# Patient Record
Sex: Female | Born: 1960 | Race: White | Hispanic: No | Marital: Married | Smoking: Never smoker
Health system: Southern US, Community
[De-identification: ages and names within clinical notes are randomized; demographics above are authoritative.]

## PROBLEM LIST (undated history)

## (undated) DIAGNOSIS — N840 Polyp of corpus uteri: Secondary | ICD-10-CM

## (undated) DIAGNOSIS — N83292 Other ovarian cyst, left side: Secondary | ICD-10-CM

## (undated) DIAGNOSIS — R011 Cardiac murmur, unspecified: Secondary | ICD-10-CM

## (undated) DIAGNOSIS — J302 Other seasonal allergic rhinitis: Secondary | ICD-10-CM

## (undated) DIAGNOSIS — Z973 Presence of spectacles and contact lenses: Secondary | ICD-10-CM

## (undated) DIAGNOSIS — K589 Irritable bowel syndrome without diarrhea: Secondary | ICD-10-CM

## (undated) DIAGNOSIS — Z8719 Personal history of other diseases of the digestive system: Secondary | ICD-10-CM

## (undated) HISTORY — DX: Irritable bowel syndrome, unspecified: K58.9

## (undated) HISTORY — DX: Other seasonal allergic rhinitis: J30.2

## (undated) HISTORY — PX: COLONOSCOPY: SHX174

## (undated) HISTORY — PX: COLONOSCOPY WITH PROPOFOL: SHX5780

## (undated) SURGERY — Surgical Case
Anesthesia: *Unknown

---

## 1998-02-02 ENCOUNTER — Inpatient Hospital Stay (HOSPITAL_COMMUNITY): Admission: AD | Admit: 1998-02-02 | Discharge: 1998-02-04 | Payer: Self-pay | Admitting: Obstetrics and Gynecology

## 1998-02-07 ENCOUNTER — Inpatient Hospital Stay (HOSPITAL_COMMUNITY): Admission: AD | Admit: 1998-02-07 | Discharge: 1998-02-07 | Payer: Self-pay | Admitting: Obstetrics and Gynecology

## 2004-08-14 ENCOUNTER — Ambulatory Visit: Payer: Self-pay | Admitting: Family Medicine

## 2004-08-28 ENCOUNTER — Ambulatory Visit: Payer: Self-pay | Admitting: Internal Medicine

## 2004-10-03 ENCOUNTER — Ambulatory Visit: Payer: Self-pay | Admitting: Internal Medicine

## 2004-10-03 ENCOUNTER — Encounter (INDEPENDENT_AMBULATORY_CARE_PROVIDER_SITE_OTHER): Payer: Self-pay | Admitting: *Deleted

## 2004-10-17 ENCOUNTER — Ambulatory Visit (HOSPITAL_COMMUNITY): Admission: RE | Admit: 2004-10-17 | Discharge: 2004-10-17 | Payer: Self-pay | Admitting: Internal Medicine

## 2004-10-19 ENCOUNTER — Ambulatory Visit: Payer: Self-pay | Admitting: Internal Medicine

## 2005-03-04 ENCOUNTER — Ambulatory Visit: Payer: Self-pay | Admitting: Family Medicine

## 2007-02-27 ENCOUNTER — Ambulatory Visit: Payer: Self-pay | Admitting: Family Medicine

## 2007-02-27 DIAGNOSIS — J069 Acute upper respiratory infection, unspecified: Secondary | ICD-10-CM | POA: Insufficient documentation

## 2007-04-09 HISTORY — PX: PLANTAR FASCIA SURGERY: SHX746

## 2007-07-14 ENCOUNTER — Encounter: Payer: Self-pay | Admitting: Family Medicine

## 2007-08-12 ENCOUNTER — Ambulatory Visit (HOSPITAL_COMMUNITY): Admission: RE | Admit: 2007-08-12 | Discharge: 2007-08-12 | Payer: Self-pay | Admitting: Obstetrics and Gynecology

## 2007-08-12 ENCOUNTER — Encounter (INDEPENDENT_AMBULATORY_CARE_PROVIDER_SITE_OTHER): Payer: Self-pay | Admitting: Obstetrics and Gynecology

## 2007-08-12 ENCOUNTER — Ambulatory Visit: Payer: Self-pay | Admitting: Cardiology

## 2007-08-12 HISTORY — PX: TRANSTHORACIC ECHOCARDIOGRAM: SHX275

## 2007-09-28 ENCOUNTER — Ambulatory Visit: Payer: Self-pay | Admitting: Cardiovascular Disease

## 2008-08-29 ENCOUNTER — Encounter: Payer: Self-pay | Admitting: Family Medicine

## 2010-05-02 ENCOUNTER — Ambulatory Visit
Admission: RE | Admit: 2010-05-02 | Discharge: 2010-05-02 | Payer: Self-pay | Source: Home / Self Care | Attending: Family Medicine | Admitting: Family Medicine

## 2010-05-02 DIAGNOSIS — J019 Acute sinusitis, unspecified: Secondary | ICD-10-CM | POA: Insufficient documentation

## 2010-05-10 NOTE — Assessment & Plan Note (Signed)
Summary: congested/cb   Vital Signs:  Patient profile:   50 year old female Height:      53 inches Weight:      140 pounds BMI:     35.17 Temp:     98.7 degrees F oral BP sitting:   118 / 70  (left arm)  Vitals Entered By: Doristine Devoid CMA (May 02, 2010 2:43 PM) CC: ears full and painful along w/ sinus pain and pressure having HA and cough   History of Present Illness: 50 yo woman here today for ? sinus infxn.  sxs started Friday w/ sore throat.  progressed to nasal and head congestion.  now having facial pain, ear fullness and pressure.  no tooth pain.  + nasal drainage.  + cough.  no fevers.  + sick contacts.  Preventive Screening-Counseling & Management  Alcohol-Tobacco     Smoking Status: never  Current Medications (verified): 1)  None  Allergies (verified): No Known Drug Allergies  Past History:  Past Medical History: none  Past Surgical History: none  Social History: married 2 daughters Smoking Status:  never  Review of Systems      See HPI  Physical Exam  General:  Well-developed,well-nourished,in no acute distress; alert,appropriate and cooperative throughout examination Head:  NCAT, + TTP over frontal and maxillary sinuses Eyes:  no injxn or inflammation Ears:  External ear exam shows no significant lesions or deformities.  Otoscopic examination reveals clear canals, tympanic membranes are intact bilaterally without bulging, retraction, inflammation or discharge. Hearing is grossly normal bilaterally. Nose:  + congestion Mouth:  postnasal drip.   Neck:  No deformities, masses, or tenderness noted. Lungs:  Normal respiratory effort, chest expands symmetrically. Lungs are clear to auscultation, no crackles or wheezes. Heart:  Normal rate and regular rhythm. S1 and S2 normal without gallop, murmur, click, rub or other extra sounds.   Impression & Recommendations:  Problem # 1:  SINUSITIS - ACUTE-NOS (ICD-461.9) Assessment New sxs and PE  consistent w/ bacterial infxn.  start abx.  reviewed supportive care and red flags that should prompt return.  Pt expresses understanding and is in agreement w/ this plan. The following medications were removed from the medication list:    Augmentin 875-125 Mg Tabs (Amoxicillin-pot clavulanate) .Marland Kitchen... 1 by mouth two times a day Her updated medication list for this problem includes:    Augmentin 875-125 Mg Tabs (Amoxicillin-pot clavulanate) .Marland Kitchen... 1 by mouth 2 times daily.  take w/ food.  Complete Medication List: 1)  Augmentin 875-125 Mg Tabs (Amoxicillin-pot clavulanate) .Marland Kitchen.. 1 by mouth 2 times daily.  take w/ food.  Patient Instructions: 1)  You have a sinus infection 2)  Take the Augmentin as directed- take w/ food to avoid upset stomach 3)  Take the Diflucan if you develop yeast 4)  Drink plenty of fluids 5)  Mucinex as needed to thin your congestion 6)  Call with any questions or concerns 7)  Hang in there!!! Prescriptions: AUGMENTIN 875-125 MG TABS (AMOXICILLIN-POT CLAVULANATE) 1 by mouth 2 times daily.  take w/ food.  #20 x 0   Entered and Authorized by:   Neena Rhymes MD   Signed by:   Neena Rhymes MD on 05/02/2010   Method used:   Electronically to        [facility]  [address] [id number] ([facility]       [address]       [place], [place]  [address]  Ph: [phone] or [phone]       Fax: [phone]   RxID:   [id number]    Orders Added: 1)  New Patient Level II [44010]

## 2010-08-21 NOTE — Assessment & Plan Note (Signed)
[facility]                            CARDIOLOGY OFFICE NOTE   Elizabeth Meyer                     MRN:          [id number]  DATE:09/28/2007                            DOB:          [birth year]    This is a 50 year old patient referred for an abnormal echo and murmur.   The patient is asymptomatic.  She is 50 years old.  She saw Dr. Arelia Sneddon  for her routine OB/GYN exam, and he thought he heard a murmur.  She  subsequently was referred for 2D echocardiogram, which I reviewed.  Her  septal thickness was upper limits of normal at 11 mm.  She had trivial  MR.  There was a pseudonormal left ventricular filling pattern by mitral  inflows.  Dr. Diona Browner read it as concomitant with abnormal relaxation  and increased filling pressure.   However, the patient is asymptomatic.  She is not having significant  chest pain, PND, or orthopnea.  There is no fatigue.  There is no  previous history of hypertension or nonsignificant hypertrophic  cardiomyopathy.   The patient's activities lately have been somewhat limited by plantar  fasciitis.  She had a Topaz  procedure by a foot doctor recently with  improvement.  She is now wearing tennis sneakers, almost exclusively.   I had a long discussion with the patient regarding the nonspecific  findings regarding mitral inflow patterns, their age dependence, and the  fact that her septal thickness was normal.  I do not think she has  significant problems with her heart.   In fact, I am not quite sure how pseudonormal LV pattern could be  established without pulmonary vein Doppler.   In any event, the patient's apparent murmur would be benign.  She had no  significant valvular heart disease by echo and no evidence of  hypertrophic cardiomyopathy.   Her review of systems is otherwise negative.  In particular, she has not  had palpitations, syncope, chest pain, PND, or orthopnea.   Past medical history is  relatively benign.  She has had 2 uncomplicated  pregnancies and plantar fasciitis with the recent Topaz procedure.   No known allergies.   The patient's review of systems is otherwise remarkable for some  seasonal allergies and some irritable bowel syndrome.  Family history is  negative for premature coronary artery disease.  She is on Lo/Ovral  birth control and over-the-counter allergy medicines.   The patient is happily married.  She has 2 children.  She is a  Futures trader.  She is back to walking some since her foot is improving.  She is otherwise fairly sedentary.   She has 1-2 caffeinated beverages a day and does not drink or smoke.   Past medical history is otherwise benign.   PHYSICAL EXAMINATION:  GENERAL:  Remarkable for healthy-appearing 44-  year-old female in no distress.  Affect is appropriate.  VITAL SIGNS:  Her weight is 148, blood pressure is 127/78, pulse 80 and  regular, respiratory rate 14.  Afebrile.  HEENT:  Unremarkable.  Carotids normal without bruit.  No  lymphadenopathy, no thyromegaly, no JVP  elevation.  LUNGS:  Clear, good diaphragmatic motion.  No wheezing.  CHEST:  S1 and S2, a very faint short systolic ejection murmur which is  benign.  PMI normal .  ABDOMEN:  Benign.  Bowel sounds positive.  No AAA.  No bruit.  No  tenderness.  No hepatosplenomegaly.  No hepatojugular reflux.  EXTREMITIES:  Distal pulses intact.  No edema.  NEURO:  Nonfocal.  SKIN:  Warm and dry.  MUSCULOSKELETAL:  No muscular weakness.  EKG is normal with no evidence of LVH.   IMPRESSION:  1. Murmur benign.  No need for subacute bacterial endocarditis      prophylaxis.  No significant valvular heart disease by echo.  2. Abnormal mitral inflow Doppler signal, nonspecific.  No evidence of      hypertension.  Stiff heart or left ventricular hypertrophy.      Clinically not significant.  3. Plantar fasciitis.  Follow up with the foot doctor, p.r.n.      nonsteroidals, orthotic  shoes, and tennis sneakers.  4. Birth control per Dr. Arelia Sneddon, continue Lo/Ovral.  5. Seasonal allergies, p.r.n. Claritin.   The patient will be seen on an as-needed basis and would not appear to  have any significant cardiac problems.     Elizabeth Pick. Eden Emms, MD, [facility]  Electronically Signed    PCN/MedQ  DD: 09/28/2007  DT: 09/29/2007  Job #: [id number]   cc:   Juluis Meyer, M.D.  Elizabeth Perla, DO

## 2011-03-27 ENCOUNTER — Ambulatory Visit (INDEPENDENT_AMBULATORY_CARE_PROVIDER_SITE_OTHER): Payer: Managed Care, Other (non HMO) | Admitting: Internal Medicine

## 2011-03-27 ENCOUNTER — Encounter: Payer: Self-pay | Admitting: Internal Medicine

## 2011-03-27 DIAGNOSIS — J069 Acute upper respiratory infection, unspecified: Secondary | ICD-10-CM

## 2011-03-27 DIAGNOSIS — J209 Acute bronchitis, unspecified: Secondary | ICD-10-CM

## 2011-03-27 MED ORDER — AMOXICILLIN 500 MG PO CAPS: 500.0000 mg | ORAL_CAPSULE | Freq: Three times a day (TID) | ORAL | Status: AC

## 2011-03-27 MED ORDER — FLUTICASONE PROPIONATE 50 MCG/ACT NA SUSP: 2.0000 | Freq: Every day | NASAL | Status: DC

## 2011-03-27 NOTE — Patient Instructions (Signed)
Plain Mucinex for thick secretions ;force NON dairy fluids . Use a Neti pot daily as needed for sinus congestion . Fluticasone 1 spray in each nostril twice a day as needed. Use the "crossover" technique as discussed   

## 2011-03-27 NOTE — Progress Notes (Signed)
  Subjective:    Patient ID: Elizabeth Meyer, female    DOB: April 26, 1960, 50 y.o.   MRN: [id number]  HPI Respiratory tract infection Onset/symptoms:12/13 as ST Exposures (illness/environmental/extrinsic):no Progression of symptoms:to chest & head congestion as of 12/16 with laryngitis Treatments/response:Mucinex w/o benefit; Sudafed helps Present symptoms: Fever/chills/sweats:no Frontal headache:no Facial pain:some Nasal purulence:yellow green as of today  Dental pain:no Lymphadenopathy:no Wheezing/shortness of breath:no Cough/sputum/hemoptysis:as of12/17 yellow sputum Pleuritic pain:no Associated extrinsic/allergic symptoms:itchy eyes/ sneezing:no Past medical history: Seasonal allergies: yes/asthma:no Smoking history:neverww               Review of Systems     Objective:   Physical Exam General appearance is of good health and nourishment; no acute distress or increased work of breathing is present.  No  lymphadenopathy about the head, neck, or axilla noted.   Eyes: No conjunctival inflammation or lid edema is present.  Ears:  External ear exam shows no significant lesions or deformities.  Otoscopic examination reveals clear canals, tympanic membranes are intact bilaterally without bulging, retraction, inflammation or discharge.  Nose:  External nasal examination shows no deformity or inflammation. Nasal mucosa are pink and moist without lesions or exudates. No septal dislocation .No obstruction to airflow.   Oral exam: Dental hygiene is good; lips and gums are healthy appearing.There is no oropharyngeal erythema or exudate noted.      Heart:  Normal rate and regular rhythm. S1 and S2 normal without gallop, murmur, click, rub or other extra sounds.   Lungs:Chest clear to auscultation; no wheezes, rhonchi,rales ,or rubs present.No increased work of breathing.    Extremities:  No cyanosis, edema, or clubbing  noted    Skin: Warm & dry           Assessment &  Plan:   #1 upper structure infection with period of secretions and laryngitis  #2 purulent sputum which historically does not appear to be related to postnasal drainage  Plan: See orders and recommendations. Neti pot recommended  & stopping the Sudafed.

## 2011-08-28 ENCOUNTER — Other Ambulatory Visit: Payer: Self-pay | Admitting: Dermatology

## 2012-11-25 ENCOUNTER — Encounter: Payer: Self-pay | Admitting: Internal Medicine

## 2012-12-28 ENCOUNTER — Encounter: Payer: Self-pay | Admitting: Internal Medicine

## 2012-12-28 ENCOUNTER — Other Ambulatory Visit (INDEPENDENT_AMBULATORY_CARE_PROVIDER_SITE_OTHER): Payer: Managed Care, Other (non HMO)

## 2012-12-28 ENCOUNTER — Ambulatory Visit (INDEPENDENT_AMBULATORY_CARE_PROVIDER_SITE_OTHER): Payer: Managed Care, Other (non HMO) | Admitting: Internal Medicine

## 2012-12-28 VITALS — BP 110/72 | HR 84 | Ht 61.75 in | Wt 151.0 lb

## 2012-12-28 DIAGNOSIS — K589 Irritable bowel syndrome without diarrhea: Secondary | ICD-10-CM

## 2012-12-28 DIAGNOSIS — R109 Unspecified abdominal pain: Secondary | ICD-10-CM

## 2012-12-28 DIAGNOSIS — R197 Diarrhea, unspecified: Secondary | ICD-10-CM

## 2012-12-28 MED ORDER — HYOSCYAMINE SULFATE 0.125 MG SL SUBL: 0.1250 mg | SUBLINGUAL_TABLET | SUBLINGUAL | Status: DC | PRN

## 2012-12-28 NOTE — Patient Instructions (Addendum)
Your physician has requested that you go to the basement for lab work before leaving today.  We have sent the following medications to your pharmacy for you to pick up at your convenience: Generic Levsin   Today you have been given handouts to read and follow on IBS and also low fiber and Low residue diet.  Follow up with [place] in 6 weeks.   I appreciate the opportunity to care for you.

## 2012-12-28 NOTE — Progress Notes (Signed)
  Subjective:    Patient ID: Elizabeth Meyer, female    DOB: September 21, 1960, 52 y.o.   MRN: [id number]  HPI The patient is a very nice woman seen in the past, 2006, for similar c/o of abdominal pain and diarrhea. A colonoscopy revealed some terminal ileal ulcers. Apparently she improved some but then has had years of crampy abdominal pain and post-prandial urgent loose defecation. She also has bulging in the anorectum without bleeding - says there are protrusions that spontaneously reduce. No weight loss. No fevere. Sxs are stable over the years. Denies lactose problems or excessive sugar-free foods. No Known Allergies Outpatient Prescriptions Prior to Visit  Medication Sig Dispense Refill  . fluticasone (FLONASE) 50 MCG/ACT nasal spray Place 2 sprays into the nose daily.  16 g  6   No facility-administered medications prior to visit.   Past Medical History  Diagnosis Date  . Plantar fasciitis   . Seasonal allergies    Past Surgical History  Procedure Laterality Date  . Topaz procedure      for plantar fasciitis  . Colonoscopy w/ biopsies  10/03/2004   History   Social History  . Marital Status: Married    Spouse Name: N/A    Number of Children: 2  . Years of Education: N/A   Occupational History  . preschool teacher    Social History Main Topics  . Smoking status: Never Smoker   . Smokeless tobacco: Never Used  . Alcohol Use: Yes     Comment: oaccional-social  . Drug Use: No          Social History Narrative   Married, Administrator 2 daughters   1-2 caffeinated beverages/day   Family History  Problem Relation Age of Onset  . Glaucoma Mother   . Hypertension Mother   . Hyperlipidemia Mother   . Heart disease Paternal Grandfather   . Tuberculosis Maternal Grandfather   . Dementia Paternal Grandmother   . Diabetes Maternal Grandfather    Review of Systems + allergies - all other ROS negative or as above    Objective:   Physical Exam General:   Well-developed, well-nourished and in no acute distress Eyes:  anicteric. Neck:   supple w/o thyromegaly or mass.  Lungs: Clear to auscultation bilaterally. Heart:  S1S2, no rubs, murmurs, gallops. Abdomen:  soft, non-tender, no hepatosplenomegaly, hernia, or mass and BS+.  Lymph:  no inguinal adenopathy. Extremities:   no edema Skin   no rash. Neuro:  A&O x 3.  Psych:  appropriate mood and  Affect.   Data Reviewed: 2006 records colonoscopy, pathology, negative SBFT       Assessment & Plan:  IBS (irritable bowel syndrome) - most likely - did have non-specific erosins of terminal ileum 2006, negative SBFT   Diarrhea  Abdominal pain, lower, unspecified laterality   1. Hyoscyamine 0.125 mg prn 2. Low fiber diet 3. TTG Ab and IgA level, IBD screen 4. ? Hemorrhoid ligation    5. Hold on colonoscopy, due for screening later this year though could wait 6. See me 6 weeks 7. IBS education 8. Advised to get influenza vaccination this season

## 2012-12-29 ENCOUNTER — Encounter: Payer: Self-pay | Admitting: Internal Medicine

## 2012-12-29 LAB — IBD EXPANDED PANEL: Atypical pANCA: NEGATIVE

## 2012-12-29 LAB — TISSUE TRANSGLUTAMINASE, IGA: Tissue Transglutaminase Ab, IgA: 2.4 U/mL (ref <20)

## 2012-12-31 NOTE — Progress Notes (Signed)
Quick Note:  Please nT IBD panel was negative, which goes against her having Crohn's disease. We'll discuss more at followup ______

## 2013-03-30 ENCOUNTER — Encounter: Payer: Self-pay | Admitting: Internal Medicine

## 2013-07-02 ENCOUNTER — Encounter: Payer: Self-pay | Admitting: Internal Medicine

## 2014-01-04 ENCOUNTER — Other Ambulatory Visit: Payer: Self-pay | Admitting: Obstetrics and Gynecology

## 2014-01-05 LAB — CYTOLOGY - PAP

## 2014-03-02 ENCOUNTER — Encounter: Payer: Self-pay | Admitting: Gastroenterology

## 2014-03-08 ENCOUNTER — Ambulatory Visit (INDEPENDENT_AMBULATORY_CARE_PROVIDER_SITE_OTHER): Payer: 59 | Admitting: Internal Medicine

## 2014-03-08 ENCOUNTER — Encounter: Payer: Self-pay | Admitting: Internal Medicine

## 2014-03-08 VITALS — BP 110/70 | HR 90 | Ht 62.0 in | Wt 146.8 lb

## 2014-03-08 DIAGNOSIS — Z1211 Encounter for screening for malignant neoplasm of colon: Secondary | ICD-10-CM

## 2014-03-08 DIAGNOSIS — K589 Irritable bowel syndrome without diarrhea: Secondary | ICD-10-CM

## 2014-03-08 NOTE — Patient Instructions (Addendum)
You have been scheduled for a colonoscopy. Please follow written instructions given to you at your visit today.  Please pick up your prep supplies at the pharmacy. If you use inhalers (even only as needed), please bring them with you on the day of your procedure. Your physician has requested that you go to www.startemmi.com and enter the access code given to you at your visit today. This web site gives a general overview about your procedure. However, you should still follow specific instructions given to you by our office regarding your preparation for the procedure.    You may use Crystal Lite in place of Gatorade for your prep.   I appreciate the opportunity to care for you.

## 2014-03-08 NOTE — Progress Notes (Signed)
   Subjective:    Patient ID: Elizabeth Meyer, female    DOB: 1961/02/08, 53 y.o.   MRN: [id number]  HPI Is a very nice lady with a history of IBS which is controlled by intermittent hyoscyamine use. She had a colonoscopy in 2006 notes and terminal ileal erosions at that time but subsequent serologic workup or IBD and celiac disease has been negative. Most days she is well and she only uses the hyoscyamine when necessary. She is now 53 and it is appropriate to consider a screening colonoscopy as we discussed last year. Medications, allergies, past medical history, past surgical history, family history and social history are reviewed and updated in the EMR.   Review of Systems As above    Objective:   Physical Exam Well-developed middle-aged white woman in no acute distress    Assessment & Plan:   1. IBS (irritable bowel syndrome)   2. Colon cancer screening    1. screening colonoscopy will be scheduled The risks and benefits as well as alternatives of endoscopic procedure(s) have been discussed and reviewed. All questions answered. The patient agrees to proceed. Continue intermittent hyoscyamine She will use Crystal light in place of Gatorade for her colonoscopy prep per her request Will copy PCP and Dr. Radene Knee with colonoscopy report

## 2014-03-25 ENCOUNTER — Encounter: Payer: Self-pay | Admitting: Internal Medicine

## 2014-04-28 ENCOUNTER — Encounter: Payer: Self-pay | Admitting: Internal Medicine

## 2014-04-28 ENCOUNTER — Ambulatory Visit (AMBULATORY_SURGERY_CENTER): Payer: 59 | Admitting: Internal Medicine

## 2014-04-28 VITALS — BP 118/70 | HR 62 | Temp 97.9°F | Resp 14 | Ht 62.0 in | Wt 146.0 lb

## 2014-04-28 DIAGNOSIS — K589 Irritable bowel syndrome without diarrhea: Secondary | ICD-10-CM

## 2014-04-28 DIAGNOSIS — Z1211 Encounter for screening for malignant neoplasm of colon: Secondary | ICD-10-CM

## 2014-04-28 MED ORDER — SODIUM CHLORIDE 0.9 % IV SOLN: 500.0000 mL | INTRAVENOUS | Status: DC

## 2014-04-28 NOTE — Progress Notes (Signed)
Stable to RR 

## 2014-04-28 NOTE — Patient Instructions (Addendum)
I am pleased to tell you the colonoscopy was normal. So was the terminal ileum - no ulcers.  I hope you stay well. Some people with IBS find that following a special diet called a FODMAPS diet helps them - you may want to investigate this depending upon how things go.  Next routine colonoscopy in 10 years - 2026  I appreciate the opportunity to care for you. Elizabeth Mayer, MD, FACG  YOU HAD AN ENDOSCOPIC PROCEDURE TODAY AT [facility]: Refer to the procedure report that was given to you for any specific questions about what was found during the examination.  If the procedure report does not answer your questions, please call your gastroenterologist to clarify.  If you requested that your care partner not be given the details of your procedure findings, then the procedure report has been included in a sealed envelope for you to review at your convenience later.  YOU SHOULD EXPECT: Some feelings of bloating in the abdomen. Passage of more gas than usual.  Walking can help get rid of the air that was put into your GI tract during the procedure and reduce the bloating. If you had a lower endoscopy (such as a colonoscopy or flexible sigmoidoscopy) you may notice spotting of blood in your stool or on the toilet paper. If you underwent a bowel prep for your procedure, then you may not have a normal bowel movement for a few days.  DIET: Your first meal following the procedure should be a light meal and then it is ok to progress to your normal diet.  A half-sandwich or bowl of soup is an example of a good first meal.  Heavy or fried foods are harder to digest and may make you feel nauseous or bloated.  Likewise meals heavy in dairy and vegetables can cause extra gas to form and this can also increase the bloating.  Drink plenty of fluids but you should avoid alcoholic beverages for 24 hours.  ACTIVITY: Your care partner should take you home directly after the procedure.  You should  plan to take it easy, moving slowly for the rest of the day.  You can resume normal activity the day after the procedure however you should NOT DRIVE or use heavy machinery for 24 hours (because of the sedation medicines used during the test).    SYMPTOMS TO REPORT IMMEDIATELY: A gastroenterologist can be reached at any hour.  During normal business hours, 8:30 AM to 5:00 PM Monday through Friday, call [phone].  After hours and on weekends, please call the GI answering service at [phone] who will take a message and have the physician on call contact you.   Following lower endoscopy (colonoscopy or flexible sigmoidoscopy):  Excessive amounts of blood in the stool  Significant tenderness or worsening of abdominal pains  Swelling of the abdomen that is new, acute  Fever of 100F or higher  Following upper endoscopy (EGD)  Vomiting of blood or coffee ground material  New chest pain or pain under the shoulder blades  Painful or persistently difficult swallowing  New shortness of breath  Fever of 100F or higher  Black, tarry-looking stools  FOLLOW UP: If any biopsies were taken you will be contacted by phone or by letter within the next 1-3 weeks.  Call your gastroenterologist if you have not heard about the biopsies in 3 weeks.  Our staff will call the home number listed on your records the next business day following  your procedure to check on you and address any questions or concerns that you may have at that time regarding the information given to you following your procedure. This is a courtesy call and so if there is no answer at the home number and we have not heard from you through the emergency physician on call, we will assume that you have returned to your regular daily activities without incident.  SIGNATURES/CONFIDENTIALITY: You and/or your care partner have signed paperwork which will be entered into your electronic medical record.  These signatures attest to the fact  that that the information above on your After Visit Summary has been reviewed and is understood.  Full responsibility of the confidentiality of this discharge information lies with you and/or your care-partner.  You might note some irritation in your nose or some drainage.  This may cause feels of congestion.  This is from the oxygen, which can be irritating.  There is no need for concern, this should clear up in a day or so  Next colonoscopy in 10 years  Please continue your normal medications  Please call Dr. Carlean Purl to set up an office visit for an IBS problems

## 2014-04-28 NOTE — Op Note (Addendum)
[facility]  [address]. [place], [address]   COLONOSCOPY PROCEDURE REPORT  PATIENT: Elizabeth, Meyer  MR#: [id number] BIRTHDATE: Jun 17, 1960 , 13  yrs. old GENDER: female ENDOSCOPIST: Gatha Mayer, MD, [facility] PROCEDURE DATE:  04/28/2014 PROCEDURE:   Colonoscopy, screening First Screening Colonoscopy - Avg.  risk and is 50 yrs.  old or older Yes.  Prior Negative Screening - Now for repeat screening. N/A  History of Adenoma - Now for follow-up colonoscopy & has been > or = to 3 yrs.  N/A  Polyps Removed Today? No.  Polyps Removed Today? No.  Recommend repeat exam, <10 yrs? Polyps Removed Today? No.  Recommend repeat exam, <10 yrs? No. ASA CLASS:   Class I INDICATIONS:average risk for colorectal cancer. MEDICATIONS: Propofol 300 mg IV, Lidocaine 40 mg IV, and Monitored anesthesia care  DESCRIPTION OF PROCEDURE:   After the risks benefits and alternatives of the procedure were thoroughly explained, informed consent was obtained.  The digital rectal exam revealed no abnormalities of the rectum.   The LB [id number]  endoscope was introduced through the anus and advanced to the terminal ileum which was intubated for a short distance. No adverse events experienced.   The quality of the prep was good, using MiraLax  The instrument was then slowly withdrawn as the colon was fully examined.      COLON FINDINGS: A normal appearing cecum, ileocecal valve, and appendiceal orifice were identified.  The ascending, transverse, descending, sigmoid colon, and rectum appeared unremarkable.   The examined terminal ileum appeared to be normal.  Retroflexed views revealed no abnormalities. The time to cecum=4 minutes 14 seconds. Withdrawal time=7 minutes 49 seconds.  The scope was withdrawn and the procedure completed. COMPLICATIONS: There were no immediate complications.  ENDOSCOPIC IMPRESSION: 1.   Normal colonoscopy 2.   The examined terminal ileum appeared to be  normal  RECOMMENDATIONS: 1.  Repeat colonoscopy 10 years. 2.  See me prn otherwise for IBS She may consider trying FODMAPS diet  eSigned:  Gatha Mayer, MD, [facility] 04/28/2014 1:57 PM   cc: Arvella Nigh, MD and The Patient

## 2014-05-02 ENCOUNTER — Telehealth: Payer: Self-pay | Admitting: *Deleted

## 2014-05-02 NOTE — Telephone Encounter (Signed)
  Follow up Call-  Call back number 04/28/2014  Post procedure Call Back phone  # [phone]  Permission to leave phone message Yes     Patient questions:  Do you have a fever, pain , or abdominal swelling? No. Pain Score  0 *  Have you tolerated food without any problems? Yes.    Have you been able to return to your normal activities? Yes.    Do you have any questions about your discharge instructions: Diet   No. Medications  No. Follow up visit  No.  Do you have questions or concerns about your Care? No.  Actions: * If pain score is 4 or above: No action needed, pain <4.

## 2014-06-01 ENCOUNTER — Encounter (HOSPITAL_BASED_OUTPATIENT_CLINIC_OR_DEPARTMENT_OTHER): Payer: Self-pay | Admitting: *Deleted

## 2014-06-01 NOTE — Progress Notes (Signed)
NPO AFTER MN. ARRIVE AT 0600. PT TO GET LAB DONE THIS Thursday 06-02-2014 OR Friday 06-03-2014.

## 2014-06-01 NOTE — H&P (Signed)
NAME:  Elizabeth Meyer, Elizabeth Meyer NO.:  [id number]  MEDICAL RECORD NO.:  [id number]  LOCATION:                                 FACILITY:  PHYSICIAN:  [name], M.D.        DATE OF BIRTH:  DATE OF ADMISSION:  06/06/2014 DATE OF DISCHARGE:                             HISTORY & PHYSICAL   The patient is going to have surgery on February 29, at [facility] in [place]:  The patient is a 54 year old gravida 2, para 2, married female, who presents for a laparoscopic left salpingo- oophorectomy as well as hysteroscopic evaluation and resection of possible endometrial polyp.  In relation to the present admission, the patient was menopausal and had episodes of vaginal bleeding.  We did a saline infusion ultrasound that revealed an area within endocervical canal and lower uterine segment that look like a polyp versus clot.  It did persist on followup ultrasound.  She also had a complex cyst of the left ovary measuring 5 x 3 x 4.  It persisted on followup ultrasound.  Her CA-125's were completely negative.  In view of this, she now proceeds for the above- noted surgery.  ALLERGIES:  She has no known drug allergies.  MEDICATIONS:  None.  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  PREVIOUS SURGICAL HISTORY:  She had surgery for plantar fasciitis.  She has had 2 vaginal deliveries.  SOCIAL HISTORY:  Reveals no tobacco or alcohol use.  FAMILY HISTORY:  Noncontributory.  REVIEW OF SYSTEMS:  Noncontributory.  PHYSICAL EXAMINATION:  VITAL SIGNS:  The patient is afebrile.  Stable vital signs. HEENT:  The patient is normocephalic.  Pupils equal, round, reactive to light and accommodation.  Extraocular movements were intact.  Sclerae and conjunctivae were clear.  Oropharynx was clear. BREASTS:  Not examined. LUNGS:  Clear. CARDIOVASCULAR:  Regular rhythm and rate without  murmurs or gallops. ABDOMEN:  Benign.  No mass, organomegaly, or tenderness. PELVIC:  Normal external genitalia.  Vaginal mucosa is clear.  Cervix unremarkable.  Uterus normal size, shape, and contour.  Adnexa free of mass or tenderness. EXTREMITIES:  Trace edema. NEUROLOGIC:  Grossly in normal limits.  IMPRESSION: 1. Postmenopausal bleeding with possible lower uterine segment polyp     and/or intracervical polyp. 2. Complex cyst of the left ovary with persistence.  PLAN:  The patient will undergo laparoscopic evaluation and removal of left tube and ovary.  She will then have hysteroscopy with D and C.  The nature of procedures have been discussed.  The risk have been explained including the risk of infection; the risk of hemorrhage that could require transfusion with the risk of AIDS or hepatitis; risk of injury to adjacent organs including bladder, bowel, or ureters that could require further exploratory surgery; risk of deep venous thrombosis; and pulmonary embolus.  We will do cystoscopy after the procedure to make sure the left ureter is not involved.  I discussed this with the patient.  She understands the indications and risks.     [name], M.D.     JSM/MEDQ  D:  06/01/2014  T:  06/01/2014  Job:  [id number]

## 2014-06-01 NOTE — H&P (Signed)
  Patient name  Mizuki, Hoel DICTATION# [id number] CSN# [id number]  [name], MD 06/01/2014 1:39 PM

## 2014-06-02 DIAGNOSIS — N838 Other noninflammatory disorders of ovary, fallopian tube and broad ligament: Secondary | ICD-10-CM | POA: Diagnosis not present

## 2014-06-02 DIAGNOSIS — D271 Benign neoplasm of left ovary: Secondary | ICD-10-CM | POA: Diagnosis not present

## 2014-06-02 DIAGNOSIS — N840 Polyp of corpus uteri: Secondary | ICD-10-CM | POA: Diagnosis not present

## 2014-06-02 DIAGNOSIS — N8320 Unspecified ovarian cysts: Secondary | ICD-10-CM | POA: Diagnosis present

## 2014-06-02 LAB — CBC
HCT: 37.1 % (ref 36.0–46.0)
HEMOGLOBIN: 12.5 g/dL (ref 12.0–15.0)
MCH: 30.6 pg (ref 26.0–34.0)
MCHC: 33.7 g/dL (ref 30.0–36.0)
MCV: 90.9 fL (ref 78.0–100.0)
Platelets: 283 10*3/uL (ref 150–400)
RBC: 4.08 MIL/uL (ref 3.87–5.11)
RDW: 12.6 % (ref 11.5–15.5)
WBC: 7.6 10*3/uL (ref 4.0–10.5)

## 2014-06-03 ENCOUNTER — Ambulatory Visit (INDEPENDENT_AMBULATORY_CARE_PROVIDER_SITE_OTHER): Payer: 59 | Admitting: Medical

## 2014-06-03 ENCOUNTER — Encounter: Payer: Self-pay | Admitting: Medical

## 2014-06-03 VITALS — BP 140/84 | HR 102 | Temp 97.8°F | Ht 61.75 in | Wt 147.2 lb

## 2014-06-03 DIAGNOSIS — J0110 Acute frontal sinusitis, unspecified: Secondary | ICD-10-CM | POA: Diagnosis not present

## 2014-06-03 HISTORY — DX: Acute frontal sinusitis, unspecified: J01.10

## 2014-06-03 MED ORDER — BENZONATATE 100 MG PO CAPS: 100.0000 mg | ORAL_CAPSULE | Freq: Three times a day (TID) | ORAL | Status: DC | PRN

## 2014-06-03 MED ORDER — FLUTICASONE PROPIONATE 50 MCG/ACT NA SUSP: 2.0000 | Freq: Every day | NASAL | Status: DC

## 2014-06-03 MED ORDER — AMOXICILLIN-POT CLAVULANATE 875-125 MG PO TABS: 1.0000 | ORAL_TABLET | Freq: Two times a day (BID) | ORAL | Status: DC

## 2014-06-03 NOTE — Assessment & Plan Note (Addendum)
Your appear to have possible sinus infection. I am prescribing  augmentin antibiotic for the infection. To help with the nasal congestion I prescribed nasal steroid flonase. For your associated cough, I prescribed cough medicine benzonatate  Rest, hydrate, tylenol for fever.  Follow up in 7 days or as needed.  In light of upcoming surgery I do believe best to go ahead and treat. Please notify your surgeon And anesthesiologist of recent possible infection and give them update on how you feel.

## 2014-06-03 NOTE — Progress Notes (Signed)
Pre visit review using our clinic review tool, if applicable. No additional management support is needed unless otherwise documented below in the visit note. 

## 2014-06-03 NOTE — Progress Notes (Signed)
Subjective:    Patient ID: Elizabeth Meyer, female    DOB: 1960/12/29, 54 y.o.   MRN: [id number]  HPI   Pt in with nasal congestion. This started on Monday. Gradually worsening. Last night some sinus pressure. Random sneezing. Pt does get about 1 sinus infection a year. Pt having surgery on Monday laporospic surgery. She is getting hysterectomy. Some ear pressure.    Review of Systems  Constitutional: Negative for fever, chills and fatigue.  HENT: Positive for congestion and sinus pressure. Negative for ear discharge, ear pain, rhinorrhea, sore throat, tinnitus and trouble swallowing.   Respiratory: Positive for cough. Negative for chest tightness, shortness of breath and wheezing.   Cardiovascular: Negative for chest pain and palpitations.  Musculoskeletal: Negative for back pain.  Hematological: Negative for adenopathy. Does not bruise/bleed easily.  Psychiatric/Behavioral: Negative for behavioral problems and confusion.   Past Medical History  Diagnosis Date  . Seasonal allergies   . IBS (irritable bowel syndrome)   . Heart murmur Dr Johnsie Cancel    dx 2012--  Benign--  per echo abnormal mitral inflow  . History of gastrointestinal ulcer   . Complex cyst of left ovary   . Endometrial polyp   . Wears contact lenses     History   Social History  . Marital Status: Married    Spouse Name: N/A  . Number of Children: 2  . Years of Education: N/A   Occupational History  . preschool teacher    Social History Main Topics  . Smoking status: Never Smoker   . Smokeless tobacco: Never Used  . Alcohol Use: No  . Drug Use: No  . Sexual Activity: Not on file   Other Topics Concern  . Not on file   Social History Narrative   Married, Aeronautical engineer 2 daughters   1-2 caffeinated beverages/day    Past Surgical History  Procedure Laterality Date  . Transthoracic echocardiogram  08-12-2007    normal LV,  ef 65-70%,  abnormal mitral inflow with trivial MR,  trivial PR and  TR  . Plantar fascia surgery Left 2009    topaz procedure  . Colonoscopy with propofol  last one 04-28-2014    Family History  Problem Relation Age of Onset  . Glaucoma Mother   . Hypertension Mother   . Hyperlipidemia Mother   . Heart disease Paternal Grandfather   . Tuberculosis Maternal Grandfather   . Diabetes Maternal Grandfather   . Dementia Paternal Grandmother   . Tuberculosis    . Colon cancer Neg Hx   . Stomach cancer Neg Hx     No Known Allergies  Current Outpatient Prescriptions on File Prior to Visit  Medication Sig Dispense Refill  . cetirizine (ZYRTEC) 10 MG tablet Take 10 mg by mouth as needed for allergies.    . Ibuprofen 200 MG CAPS Take by mouth as needed.    . Multiple Vitamins-Minerals (AIRBORNE PO) Take by mouth daily.    . pseudoephedrine (SUDAFED) 120 MG 12 hr tablet Take 120 mg by mouth every 12 (twelve) hours as needed for congestion.     No current facility-administered medications on file prior to visit.    BP 140/84 mmHg  Pulse 102  Temp(Src) 97.8 F (36.6 C) (Oral)  Ht 5' 1.75" (1.568 m)  Wt 147 lb 3.2 oz (66.769 kg)  BMI 27.16 kg/m2  SpO2 99%  LMP 01/31/2014      Objective:   Physical Exam   General  Mental Status -  Alert. General Appearance - Well groomed. Not in acute distress.  Skin Rashes- No Rashes.  HEENT Head- Normal. Ear Auditory Canal - Left- Normal. Right - Normal.Tympanic Membrane- Left- Normal. Right- Normal. Eye Sclera/Conjunctiva- Left- Normal. Right- Normal. Nose & Sinuses Nasal Mucosa- Left-  Boggy and Congested. Right-  Boggy and  Congested. NO Bilateral maxillary but frontal sinus pressure. Mouth & Throat Lips: Upper Lip- Normal: no dryness, cracking, pallor, cyanosis, or vesicular eruption. Lower Lip-Normal: no dryness, cracking, pallor, cyanosis or vesicular eruption. Buccal Mucosa- Bilateral- No Aphthous ulcers. Oropharynx- No Discharge or Erythema.+pnd. Tonsils: Characteristics- Bilateral- No  Erythema or Congestion. Size/Enlargement- Bilateral- No enlargement. Discharge- bilateral-None.  Neck Neck- Supple. No Masses.   Chest and Lung Exam Auscultation: Breath Sounds:-Clear even and unlabored.  Cardiovascular Auscultation:Rythm- Regular, rate and rhythm. Murmurs & Other Heart Sounds:Ausculatation of the heart reveal- No Murmurs.  Lymphatic Head & Neck General Head & Neck Lymphatics: Bilateral: Description- No Localized lymphadenopathy.      Assessment & Plan:

## 2014-06-03 NOTE — Patient Instructions (Addendum)
Sinusitis, acute frontal Your appear to have possible sinus infection. I am prescribing  augmentin antibiotic for the infection. To help with the nasal congestion I prescribed nasal steroid flonase. For your associated cough, I prescribed cough medicine benzonatate  Rest, hydrate, tylenol for fever.  Follow up in 7 days or as needed.  In light of upcoming surgery I do believe best to go ahead and treat. Please notify your surgeon And anesthesiologist of recent possible infection and give them update on how you feel.

## 2014-06-05 NOTE — Anesthesia Preprocedure Evaluation (Addendum)
Anesthesia Evaluation  Patient identified by MRN, date of birth, ID band Patient awake    Reviewed: Allergy & Precautions, H&P , NPO status , Patient's Chart, lab work & pertinent test results  Airway Mallampati: II  TM Distance: >3 FB Neck ROM: full    Dental no notable dental hx. (+) Teeth Intact, Dental Advisory Given   Pulmonary neg pulmonary ROS,  breath sounds clear to auscultation  Pulmonary exam normal       Cardiovascular Exercise Tolerance: Good negative cardio ROS  Rhythm:regular Rate:Normal     Neuro/Psych negative neurological ROS  negative psych ROS   GI/Hepatic negative GI ROS, Neg liver ROS,   Endo/Other  negative endocrine ROS  Renal/GU negative Renal ROS  negative genitourinary   Musculoskeletal   Abdominal   Peds  Hematology negative hematology ROS (+)   Anesthesia Other Findings   Reproductive/Obstetrics negative OB ROS                            Anesthesia Physical Anesthesia Plan  ASA: I  Anesthesia Plan: General   Post-op Pain Management:    Induction: Intravenous  Airway Management Planned: Oral ETT  Additional Equipment:   Intra-op Plan:   Post-operative Plan: Extubation in OR  Informed Consent: I have reviewed the patients History and Physical, chart, labs and discussed the procedure including the risks, benefits and alternatives for the proposed anesthesia with the patient or authorized representative who has indicated his/her understanding and acceptance.   Dental Advisory Given  Plan Discussed with: CRNA and Surgeon  Anesthesia Plan Comments:         Anesthesia Quick Evaluation  

## 2014-06-06 ENCOUNTER — Encounter (HOSPITAL_BASED_OUTPATIENT_CLINIC_OR_DEPARTMENT_OTHER): Payer: Self-pay

## 2014-06-06 ENCOUNTER — Encounter (HOSPITAL_BASED_OUTPATIENT_CLINIC_OR_DEPARTMENT_OTHER)
Admission: RE | Disposition: A | Discharge status: Home / Self Care | Payer: Self-pay | Source: Ambulatory Visit | Attending: Obstetrics and Gynecology

## 2014-06-06 ENCOUNTER — Ambulatory Visit (HOSPITAL_BASED_OUTPATIENT_CLINIC_OR_DEPARTMENT_OTHER): Payer: 59 | Admitting: Anesthesiology

## 2014-06-06 ENCOUNTER — Ambulatory Visit (HOSPITAL_BASED_OUTPATIENT_CLINIC_OR_DEPARTMENT_OTHER)
Admission: RE | Admit: 2014-06-06 | Discharge: 2014-06-06 | Disposition: A | Discharge status: Home / Self Care | Payer: 59 | Source: Ambulatory Visit | Attending: Obstetrics and Gynecology | Admitting: Obstetrics and Gynecology

## 2014-06-06 DIAGNOSIS — N840 Polyp of corpus uteri: Secondary | ICD-10-CM | POA: Insufficient documentation

## 2014-06-06 DIAGNOSIS — D271 Benign neoplasm of left ovary: Secondary | ICD-10-CM | POA: Insufficient documentation

## 2014-06-06 DIAGNOSIS — N838 Other noninflammatory disorders of ovary, fallopian tube and broad ligament: Secondary | ICD-10-CM | POA: Insufficient documentation

## 2014-06-06 HISTORY — DX: Cardiac murmur, unspecified: R01.1

## 2014-06-06 HISTORY — PX: LAPAROSCOPIC SALPINGO OOPHERECTOMY: SHX5927

## 2014-06-06 HISTORY — PX: CYSTOSCOPY: SHX5120

## 2014-06-06 HISTORY — PX: DILATATION & CURRETTAGE/HYSTEROSCOPY WITH RESECTOCOPE: SHX5572

## 2014-06-06 HISTORY — DX: Personal history of other diseases of the digestive system: Z87.19

## 2014-06-06 HISTORY — DX: Other ovarian cyst, left side: N83.292

## 2014-06-06 HISTORY — DX: Polyp of corpus uteri: N84.0

## 2014-06-06 HISTORY — DX: Benign neoplasm of left ovary: D27.1

## 2014-06-06 HISTORY — DX: Presence of spectacles and contact lenses: Z97.3

## 2014-06-06 SURGERY — DILATATION & CURETTAGE/HYSTEROSCOPY WITH RESECTOCOPE
Anesthesia: General | Site: Vagina

## 2014-06-06 MED ORDER — LIDOCAINE-EPINEPHRINE (PF) 1 %-1:200000 IJ SOLN
INTRAMUSCULAR | Status: DC | PRN
  Administered 2014-06-06: 10 mL

## 2014-06-06 MED ORDER — LACTATED RINGERS IV SOLN
INTRAVENOUS | Status: DC
  Filled 2014-06-06: qty 1000

## 2014-06-06 MED ORDER — FENTANYL CITRATE 0.05 MG/ML IJ SOLN
INTRAMUSCULAR | Status: AC
  Filled 2014-06-06: qty 6

## 2014-06-06 MED ORDER — CEFAZOLIN SODIUM-DEXTROSE 2-3 GM-% IV SOLR
2.0000 g | INTRAVENOUS | Status: AC
  Administered 2014-06-06: 2 g via INTRAVENOUS
  Filled 2014-06-06: qty 50

## 2014-06-06 MED ORDER — CEFAZOLIN SODIUM-DEXTROSE 2-3 GM-% IV SOLR
INTRAVENOUS | Status: AC
  Filled 2014-06-06: qty 50

## 2014-06-06 MED ORDER — BUPIVACAINE HCL 0.25 % IJ SOLN
INTRAMUSCULAR | Status: DC | PRN
  Administered 2014-06-06: 6 mL

## 2014-06-06 MED ORDER — GLYCOPYRROLATE 0.2 MG/ML IJ SOLN
INTRAMUSCULAR | Status: DC | PRN
  Administered 2014-06-06: 0.4 mg via INTRAVENOUS

## 2014-06-06 MED ORDER — FENTANYL CITRATE 0.05 MG/ML IJ SOLN
INTRAMUSCULAR | Status: AC
Start: 2014-06-06 — End: 2014-06-06
  Filled 2014-06-06: qty 2

## 2014-06-06 MED ORDER — SUCCINYLCHOLINE CHLORIDE 20 MG/ML IJ SOLN
INTRAMUSCULAR | Status: DC | PRN
  Administered 2014-06-06: 100 mg via INTRAVENOUS

## 2014-06-06 MED ORDER — NEOSTIGMINE METHYLSULFATE 10 MG/10ML IV SOLN
INTRAVENOUS | Status: DC | PRN
  Administered 2014-06-06: 3 mg via INTRAVENOUS

## 2014-06-06 MED ORDER — LIDOCAINE HCL (CARDIAC) 20 MG/ML IV SOLN
INTRAVENOUS | Status: DC | PRN
  Administered 2014-06-06: 80 mg via INTRAVENOUS

## 2014-06-06 MED ORDER — OXYCODONE HCL 5 MG PO TABS
ORAL_TABLET | ORAL | Status: AC
  Filled 2014-06-06: qty 1

## 2014-06-06 MED ORDER — FENTANYL CITRATE 0.05 MG/ML IJ SOLN
INTRAMUSCULAR | Status: DC | PRN
  Administered 2014-06-06: 25 ug via INTRAVENOUS
  Administered 2014-06-06 (×2): 50 ug via INTRAVENOUS
  Administered 2014-06-06: 25 ug via INTRAVENOUS

## 2014-06-06 MED ORDER — FENTANYL CITRATE 0.05 MG/ML IJ SOLN
25.0000 ug | INTRAMUSCULAR | Status: DC | PRN
  Administered 2014-06-06: 50 ug via INTRAVENOUS
  Filled 2014-06-06: qty 1

## 2014-06-06 MED ORDER — DEXAMETHASONE SODIUM PHOSPHATE 4 MG/ML IJ SOLN
INTRAMUSCULAR | Status: DC | PRN
  Administered 2014-06-06: 10 mg via INTRAVENOUS

## 2014-06-06 MED ORDER — PROPOFOL 10 MG/ML IV BOLUS
INTRAVENOUS | Status: DC | PRN
  Administered 2014-06-06: 200 mg via INTRAVENOUS

## 2014-06-06 MED ORDER — MIDAZOLAM HCL 5 MG/5ML IJ SOLN
INTRAMUSCULAR | Status: DC | PRN
  Administered 2014-06-06: 2 mg via INTRAVENOUS

## 2014-06-06 MED ORDER — OXYCODONE HCL 5 MG PO TABS
2.5000 mg | ORAL_TABLET | Freq: Four times a day (QID) | ORAL | Status: DC | PRN
  Administered 2014-06-06: 2.5 mg via ORAL
  Filled 2014-06-06: qty 1

## 2014-06-06 MED ORDER — OXYCODONE-ACETAMINOPHEN 5-325 MG PO TABS
1.0000 | ORAL_TABLET | Freq: Four times a day (QID) | ORAL | Status: DC | PRN
  Administered 2014-06-06: 1 via ORAL
  Filled 2014-06-06: qty 1

## 2014-06-06 MED ORDER — MIDAZOLAM HCL 2 MG/2ML IJ SOLN
INTRAMUSCULAR | Status: AC
  Filled 2014-06-06: qty 2

## 2014-06-06 MED ORDER — EPHEDRINE SULFATE 50 MG/ML IJ SOLN
INTRAMUSCULAR | Status: DC | PRN
  Administered 2014-06-06: 10 mg via INTRAVENOUS

## 2014-06-06 MED ORDER — LACTATED RINGERS IV SOLN
INTRAVENOUS | Status: DC
  Administered 2014-06-06 (×2): via INTRAVENOUS
  Filled 2014-06-06: qty 1000

## 2014-06-06 MED ORDER — KETOROLAC TROMETHAMINE 30 MG/ML IJ SOLN
INTRAMUSCULAR | Status: DC | PRN
  Administered 2014-06-06: 30 mg via INTRAVENOUS

## 2014-06-06 MED ORDER — GLYCINE 1.5 % IR SOLN
Status: DC | PRN
  Administered 2014-06-06: 3000 mL

## 2014-06-06 MED ORDER — ONDANSETRON HCL 4 MG/2ML IJ SOLN
INTRAMUSCULAR | Status: DC | PRN
  Administered 2014-06-06: 4 mg via INTRAVENOUS

## 2014-06-06 MED ORDER — OXYCODONE-ACETAMINOPHEN 5-325 MG PO TABS
ORAL_TABLET | ORAL | Status: AC
  Filled 2014-06-06: qty 1

## 2014-06-06 MED ORDER — LACTATED RINGERS IR SOLN
Status: DC | PRN
  Administered 2014-06-06: 3000 mL

## 2014-06-06 MED ORDER — ROCURONIUM BROMIDE 100 MG/10ML IV SOLN
INTRAVENOUS | Status: DC | PRN
  Administered 2014-06-06: 20 mg via INTRAVENOUS
  Administered 2014-06-06: 5 mg via INTRAVENOUS

## 2014-06-06 SURGICAL SUPPLY — 70 items
APPLICATOR COTTON TIP 6IN STRL (MISCELLANEOUS) ×5 IMPLANT
BAG SPEC RTRVL LRG 6X4 10 (ENDOMECHANICALS) ×3
BAG URINE DRAINAGE (UROLOGICAL SUPPLIES) IMPLANT
BANDAGE ADHESIVE 1X3 (GAUZE/BANDAGES/DRESSINGS) IMPLANT
BLADE SURG 11 STRL SS (BLADE) ×5 IMPLANT
CANISTER SUCT 3000ML (MISCELLANEOUS) ×5 IMPLANT
CANISTER SUCTION 1200CC (MISCELLANEOUS) IMPLANT
CANISTER SUCTION 2500CC (MISCELLANEOUS) ×4 IMPLANT
CATH FOLEY 2WAY SLVR  5CC 14FR (CATHETERS)
CATH FOLEY 2WAY SLVR 5CC 14FR (CATHETERS) IMPLANT
CATH ROBINSON RED A/P 16FR (CATHETERS) ×5 IMPLANT
CLOTH BEACON ORANGE TIMEOUT ST (SAFETY) ×5 IMPLANT
CONTAINER PREFILL 10% NBF 60ML (FORM) ×10 IMPLANT
CORD ACTIVE DISPOSABLE (ELECTRODE) ×2
CORD ELECTRO ACTIVE DISP (ELECTRODE) IMPLANT
DRESSING TELFA 8X3 (GAUZE/BANDAGES/DRESSINGS) ×2 IMPLANT
ELECT LOOP GYNE PRO 24FR (CUTTING LOOP) ×5
ELECT REM PT RETURN 9FT ADLT (ELECTROSURGICAL) ×5
ELECTRODE LOOP GYNE PRO 24FR (CUTTING LOOP) IMPLANT
ELECTRODE REM PT RTRN 9FT ADLT (ELECTROSURGICAL) ×3 IMPLANT
FILTER SMOKE EVAC LAPAROSHD (FILTER) IMPLANT
GLOVE BIO SURGEON STRL SZ 6.5 (GLOVE) ×1 IMPLANT
GLOVE BIO SURGEON STRL SZ7 (GLOVE) ×10 IMPLANT
GLOVE BIO SURGEONS STRL SZ 6.5 (GLOVE) ×1
GLOVE BIOGEL PI IND STRL 6.5 (GLOVE) IMPLANT
GLOVE BIOGEL PI INDICATOR 6.5 (GLOVE) ×2
GLYCINE 1.5% IRRIG UROMATIC (IV SOLUTION) ×2 IMPLANT
GOWN PREVENTION PLUS LG XLONG (DISPOSABLE) ×6 IMPLANT
GOWN STRL REUS W/TWL LRG LVL3 (GOWN DISPOSABLE) ×8 IMPLANT
GOWN STRL REUS W/TWL XL LVL3 (GOWN DISPOSABLE) ×2 IMPLANT
IV LACTATED RINGER IRRG 3000ML (IV SOLUTION) ×5
IV LR IRRIG 3000ML ARTHROMATIC (IV SOLUTION) IMPLANT
LAPAROSCOPY HANDPIECE LONG (MISCELLANEOUS) IMPLANT
LEGGING LITHOTOMY PAIR STRL (DRAPES) ×2 IMPLANT
LIQUID BAND (GAUZE/BANDAGES/DRESSINGS) ×5 IMPLANT
LOOP ANGLED CUTTING 22FR (CUTTING LOOP) ×5 IMPLANT
NDL INSUFFLATION 14GA 120MM (NEEDLE) IMPLANT
NDL SPNL 20GX3.5 QUINCKE YW (NEEDLE) ×3 IMPLANT
NEEDLE INSUFFLATION 14GA 120MM (NEEDLE) IMPLANT
NEEDLE SPNL 20GX3.5 QUINCKE YW (NEEDLE) ×5 IMPLANT
NS IRRIG 500ML POUR BTL (IV SOLUTION) ×5 IMPLANT
PACK BASIN DAY SURGERY FS (CUSTOM PROCEDURE TRAY) ×5 IMPLANT
PACK LAPAROSCOPY II (CUSTOM PROCEDURE TRAY) ×5 IMPLANT
PACK VAGINAL MINOR WOMEN LF (CUSTOM PROCEDURE TRAY) ×5 IMPLANT
PAD OB MATERNITY 4.3X12.25 (PERSONAL CARE ITEMS) ×5 IMPLANT
PAD PREP 24X48 CUFFED NSTRL (MISCELLANEOUS) ×5 IMPLANT
POUCH SPECIMEN RETRIEVAL 10MM (ENDOMECHANICALS) ×2 IMPLANT
SCISSORS LAP 5X35 DISP (ENDOMECHANICALS) IMPLANT
SEALER TISSUE G2 CVD JAW 35 (ENDOMECHANICALS) IMPLANT
SEALER TISSUE G2 CVD JAW 45CM (ENDOMECHANICALS) ×2 IMPLANT
SET IRRIG TUBING LAPAROSCOPIC (IRRIGATION / IRRIGATOR) ×2 IMPLANT
SET IRRIG Y TYPE TUR BLADDER L (SET/KITS/TRAYS/PACK) ×2 IMPLANT
SOLUTION ANTI FOG 6CC (MISCELLANEOUS) ×5 IMPLANT
SUT VIC AB 3-0 PS2 18 (SUTURE) ×10
SUT VIC AB 3-0 PS2 18XBRD (SUTURE) ×3 IMPLANT
SUT VICRYL 0 ENDOLOOP (SUTURE) IMPLANT
SUT VICRYL 0 UR6 27IN ABS (SUTURE) IMPLANT
SYRINGE 10CC LL (SYRINGE) IMPLANT
TOWEL OR 17X24 6PK STRL BLUE (TOWEL DISPOSABLE) ×12 IMPLANT
TRAY DSU PREP LF (CUSTOM PROCEDURE TRAY) ×5 IMPLANT
TROCAR BALLN 12MMX100 BLUNT (TROCAR) IMPLANT
TROCAR Z-THREAD BLADED 11X100M (TROCAR) ×7 IMPLANT
TROCAR Z-THREAD BLADED 5X100MM (TROCAR) ×5 IMPLANT
TUBING AQUILEX INFLOW (TUBING) ×5 IMPLANT
TUBING AQUILEX OUTFLOW (TUBING) ×5 IMPLANT
TUBING INSUFFLATION 10FT LAP (TUBING) ×5 IMPLANT
VACUUM HOSE/TUBING 7/8INX6FT (MISCELLANEOUS) IMPLANT
WATER STERILE IRR 1000ML POUR (IV SOLUTION) ×3 IMPLANT
WATER STERILE IRR 3000ML UROMA (IV SOLUTION) ×2 IMPLANT
WATER STERILE IRR 500ML POUR (IV SOLUTION) ×5 IMPLANT

## 2014-06-06 NOTE — Discharge Instructions (Signed)
HOME CARE INSTRUCTIONS - LAPAROSCOPY  Wound Care: The bandaids or dressing which are placed over the skin openings may be removed the day after surgery. The incision should be kept clean and dry. The stitches do not need to be removed. Should the incision become sore, red, and swollen after the first week, check with your doctor.  Personal Hygiene: Shower the day after your procedure. Always wipe from front to back after elimination.   Activity: Do not drive or operate any equipment today. The effects of the anesthesia are still present and drowsiness may result. Rest today, not necessarily flat bed rest, just take it easy. You may resume your normal activity in one to three days or as instructed by your physician.  Sexual Activity: You resume sexual activity as indicated by your physician_________. If your laparoscopy was for a sterilization ( tubes tied ), continue current method of birth control until after your next period or ask for specific instructions from your doctor.  Diet: Eat a light diet as desired this evening. You may resume a regular diet tomorrow.  Return to Work: Two to three days or as indicated by your doctor.  Expectations After Surgery: Your surgery will cause vaginal drainage or spotting which may continue for 2-3 days. Mild abdominal discomfort or tenderness is not unusual and some shoulder pain may also be noted which can be relieved by lying flat in pain.  Call Your Doctor If these Occur:  Persistent or heavy bleeding at incision site       Redness or swelling around incision       Elevation of temperature greater than 100 degrees F  Call for follow-up appointment    Post Anesthesia Home Care Instructions  Activity: Get plenty of rest for the remainder of the day. A responsible adult should stay with you for 24 hours following the procedure.  For the next 24 hours, DO NOT: -Drive a car -Paediatric nurse -Drink alcoholic beverages -Take any medication  unless instructed by your physician -Make any legal decisions or sign important papers.  Meals: Start with liquid foods such as gelatin or soup. Progress to regular foods as tolerated. Avoid greasy, spicy, heavy foods. If nausea and/or vomiting occur, drink only clear liquids until the nausea and/or vomiting subsides. Call your physician if vomiting continues.  Special Instructions/Symptoms: Your throat may feel dry or sore from the anesthesia or the breathing tube placed in your throat during surgery. If this causes discomfort, gargle with warm salt water. The discomfort should disappear within 24 hours.

## 2014-06-06 NOTE — H&P (Signed)
  History and physical exam unchanged 

## 2014-06-06 NOTE — Transfer of Care (Signed)
Immediate Anesthesia Transfer of Care Note  Patient: Elizabeth Meyer  Procedure(s) Performed: Procedure(s) (LRB): DILATATION & CURETTAGE/HYSTEROSCOPY WITH RESECTOCOPE (N/A) LAPAROSCOPIC LEFT SALPINGO OOPHORECTOMY (Left) CYSTOSCOPY (N/A)  Patient Location: PACU  Anesthesia Type: General  Level of Consciousness: awake, oriented, sedated and patient cooperative  Airway & Oxygen Therapy: Patient Spontanous Breathing and Patient connected to face mask oxygen  Post-op Assessment: Report given to PACU RN and Post -op Vital signs reviewed and stable  Post vital signs: Reviewed and stable  Complications: No apparent anesthesia complications

## 2014-06-06 NOTE — Anesthesia Postprocedure Evaluation (Signed)
  Anesthesia Post-op Note  Patient: Elizabeth Meyer  Procedure(s) Performed: Procedure(s) (LRB): DILATATION & CURETTAGE/HYSTEROSCOPY WITH RESECTOCOPE (N/A) LAPAROSCOPIC LEFT SALPINGO OOPHORECTOMY (Left) CYSTOSCOPY (N/A)  Patient Location: PACU  Anesthesia Type: General  Level of Consciousness: awake and alert   Airway and Oxygen Therapy: Patient Spontanous Breathing  Post-op Pain: mild  Post-op Assessment: Post-op Vital signs reviewed, Patient's Cardiovascular Status Stable, Respiratory Function Stable, Patent Airway and No signs of Nausea or vomiting  Last Vitals:  Filed Vitals:   06/06/14 1000  BP: 112/71  Pulse: 72  Temp:   Resp: 11    Post-op Vital Signs: stable   Complications: No apparent anesthesia complications

## 2014-06-06 NOTE — Brief Op Note (Signed)
06/06/2014  8:54 AM  PATIENT:  Elizabeth Meyer  54 y.o. female  PRE-OPERATIVE DIAGNOSIS:  Polyp/Left Ovarian Cyst  POST-OPERATIVE DIAGNOSIS:  Polyp/Left Ovarian Cyst  PROCEDURE:  Procedure(s): DILATATION & CURETTAGE/HYSTEROSCOPY WITH RESECTOCOPE (N/A) LAPAROSCOPIC LEFT SALPINGO OOPHORECTOMY (Left) CYSTOSCOPY (N/A)  SURGEON:  Surgeon(s) and Role:    * [name], MD - Primary  PHYSICIAN ASSISTANT:   ASSISTANTS: none   ANESTHESIA:   general and paracervical block  EBL:  Total I/O In: 1000 [I.V.:1000] Out: -   BLOOD ADMINISTERED:none  DRAINS: none   LOCAL MEDICATIONS USED:  MARCAINE    and XYLOCAINE   SPECIMEN:  Source of Specimen:  left tube and ovary. endometrial resections and currettings  DISPOSITION OF SPECIMEN:  PATHOLOGY  COUNTS:  YES  TOURNIQUET:  * No tourniquets in log *  DICTATION: .Other Dictation: Dictation Number [id number]  PLAN OF CARE: Discharge to home after PACU  PATIENT DISPOSITION:  PACU - hemodynamically stable.   Delay start of Pharmacological VTE agent (>24hrs) due to surgical blood loss or risk of bleeding: not applicable

## 2014-06-06 NOTE — Anesthesia Procedure Notes (Signed)
Procedure Name: Intubation Date/Time: 06/06/2014 7:35 AM Performed by: Denna Haggard D Pre-anesthesia Checklist: Patient identified, Emergency Drugs available, Suction available and Patient being monitored Patient Re-evaluated:Patient Re-evaluated prior to inductionOxygen Delivery Method: Circle System Utilized Preoxygenation: Pre-oxygenation with 100% oxygen Intubation Type: IV induction Ventilation: Mask ventilation without difficulty Laryngoscope Size: Mac and 3 Grade View: Grade I Tube type: Oral Tube size: 7.0 mm Number of attempts: 1 Airway Equipment and Method: Stylet and Oral airway Placement Confirmation: ETT inserted through vocal cords under direct vision,  positive ETCO2 and breath sounds checked- equal and bilateral Secured at: 21 cm Tube secured with: Tape Dental Injury: Teeth and Oropharynx as per pre-operative assessment

## 2014-06-07 ENCOUNTER — Encounter (HOSPITAL_BASED_OUTPATIENT_CLINIC_OR_DEPARTMENT_OTHER): Payer: Self-pay | Admitting: Obstetrics and Gynecology

## 2014-06-07 NOTE — Op Note (Signed)
NAMELUNAH, LOSASSO              ACCOUNT NO.:  [id number]  MEDICAL RECORD NO.:  [id number]  LOCATION:                                 FACILITY:  PHYSICIAN:  [name], M.D.        DATE OF BIRTH:  DATE OF PROCEDURE:  06/06/2014 DATE OF DISCHARGE:                              OPERATIVE REPORT   PREOPERATIVE DIAGNOSES: 1. Endometrial polyp. 2. Cystic enlargement of the left ovary.  POSTOPERATIVE DIAGNOSES: 1. Endometrial polyp. 2. 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  OPERATIVE PROCEDURES: 1. Diagnostic laparoscopy with left salpingo-oophorectomy. 2. Hysteroscopy with resection of endometrium as well as endometrial     curettings. 3. Cystoscopy.  SURGEON:  [name], M.D.  ANESTHESIA:  General endotracheal.  ESTIMATED BLOOD LOSS:  Minimal.  PACKS AND DRAINS:  None.  INTRAOPERATIVE BLOOD PLACEMENT:  None.  COMPLICATIONS:  None.  INDICATION:  Dictated in history and physical.  DESCRIPTION OF PROCEDURE:  The patient was taken to the OR, placed in supine position.  After satisfactory level of general endotracheal anesthesia was obtained, the patient was placed in the dorsal lithotomy position using Allen stirrups.  The abdomen, perineum, and vagina were prepped out with Betadine.  Bladder was emptied by in-and-out catheterization.  A Hulka tenaculum was put in place and secured.  The patient was draped in sterile field.  A subumbilical incision was made with a knife.  A Veress needle was inserted into the abdominal cavity without difficulty.  The abdomen was insufflated with approximately 3 liters of carbon dioxide.  A 10/11 trocar was inserted into the subumbilical area.  Laparoscope was introduced.  Visualization revealed no evidence of injury to adjacent organs.  A 10/11 trocar was put in place in the suprapubic area and a 5-mm trocar was put in place in the left lower quadrant.   Visualization reveals cystic enlargement of the left ovary.  There were no excrescences or other abnormalities.  Right tube and ovary were unremarkable.  Appendix was visualized, noted to be normal.  Upper abdomen including liver, tip of the gallbladder, and both lateral gutters were clear.  Next, we brought the EnSeal in.  The left ovary was elevated.  We could easily see the ureter along the left pelvic sidewall.  Using the EnSeal, we cauterized and incised the ovarian vasculature.  We continued to separate the tube and ovary from its mesenteric attachment up to the utero-ovarian pedicle.  The utero- ovarian pedicle was then cauterized and incised.  The Endobag was put in through the suprapubic 10/11.  The ovary was placed into the Endobag and this was brought out through the suprapubic area.  We had to lance the ovary and a large amount of sebaceous material and hair was obtained and eventually, the __________ was removed contained within the pouch and sent to Pathology.  At this point in time, the 10/11 trocar was put in place in the suprapubic area.  We irrigated the pelvis.  Did have some oozing from the left fundal area where the ovary and tube had been attached.  We brought  in the bipolar.  We cauterized these areas as well as the area of the ovarian vasculature.  We then de-inflated the abdomen and reinflated it.  We had no bleeding.  We thoroughly irrigated the pelvis.  No bleeding was noted.  The abdomen was deflated with carbon dioxide.  All trocars removed.  Subumbilical incision closed with interrupted subcuticular 4-0 Vicryl.  Suprapubic incision was closed with interrupted subcuticular 3-0 Vicryl and Dermabond.  Left lower quadrant incision was closed with Dermabond.  At this point in time, we went vaginally.  The Hulka tenaculum was then removed.  Speculum was placed in the vaginal vault.  Cervix was grasped with single-tooth tenaculum.  Paracervical block, 1% Xylocaine  with epinephrine was instituted.  Uterus sounded to 9 cm.  Cervix serially dilated to a size 32 Pratt dilator.  Hysteroscope was introduced. Intrauterine cavity distended using glycine.  On digitalization, she had some roughened endometrium, possibly one of these have been the area, the part that we disrupted with the Hulka tenaculum.  We did multiple endometrial biopsies.  These were sent for Pathology.  There was no active bleeding or signs of perforation.  Endometrial curettings were also obtained.  The hysteroscope had been removed.  Single-tooth tenaculum and speculum were then removed.  Cystoscopy was performed.  The bladder was completely intact.  We saw a good jet of urine flowing from the left ureter.  At this point in time, the cystoscope was removed.  Bladder was emptied.  The patient was taken out of the dorsal lithotomy position.  Once alert and extubated, transferred to recovery room in good condition.  Sponge, instrument, needle count was reported as correct by circulating nurse x2.  The patient tolerated the procedure well, returned to recovery in good condition.     [name], M.D.     JSM/MEDQ  D:  06/06/2014  T:  06/07/2014  Job:  [id number]

## 2014-06-21 ENCOUNTER — Encounter: Payer: Self-pay | Admitting: Medical

## 2014-06-21 ENCOUNTER — Ambulatory Visit (INDEPENDENT_AMBULATORY_CARE_PROVIDER_SITE_OTHER): Payer: 59 | Admitting: Medical

## 2014-06-21 VITALS — BP 124/73 | HR 82 | Temp 98.1°F | Ht 61.75 in | Wt 145.0 lb

## 2014-06-21 DIAGNOSIS — J029 Acute pharyngitis, unspecified: Secondary | ICD-10-CM | POA: Insufficient documentation

## 2014-06-21 DIAGNOSIS — J020 Streptococcal pharyngitis: Secondary | ICD-10-CM

## 2014-06-21 HISTORY — DX: Acute pharyngitis, unspecified: J02.9

## 2014-06-21 LAB — POCT RAPID STREP A (OFFICE): Rapid Strep A Screen: POSITIVE — AB

## 2014-06-21 MED ORDER — FLUCONAZOLE 150 MG PO TABS: 150.0000 mg | ORAL_TABLET | Freq: Once | ORAL | Status: DC

## 2014-06-21 MED ORDER — AMOXICILLIN 500 MG PO CAPS: 500.0000 mg | ORAL_CAPSULE | Freq: Three times a day (TID) | ORAL | Status: DC

## 2014-06-21 NOTE — Patient Instructions (Signed)
Acute pharyngitis Your strep test was positive. I am prescribing amoxicillin antibiotic. Rest hydrate, tylenol for fever and warm salt water gargles. Follow up in 7 days or as needed.    

## 2014-06-21 NOTE — Progress Notes (Signed)
Subjective:    Patient ID: Elizabeth Meyer, female    DOB: 1960/05/28, 54 y.o.   MRN: [id number]  HPI Pt has sore throat for  4 days.  Associated symptom.  Body aches- on Saturday. Severe.But now resolved. Fever- yes(101.1 on Saturday)Chills-yes HA-Mild faint. Neck symptoms-rt side submandibular node swollen and tender. Lymph node enlargement-enlarged rt side as above. Rash-no Painful swallowing-yes Recent strep contact-maybe works with Press photographer)     Review of Systems  Constitutional: Positive for fever and chills. Negative for fatigue.  HENT: Positive for sore throat. Negative for congestion, ear pain, postnasal drip, rhinorrhea and trouble swallowing.   Respiratory: Negative for cough, choking, shortness of breath and wheezing.   Cardiovascular: Negative for chest pain and palpitations.  Gastrointestinal: Negative for abdominal pain.  Musculoskeletal: Positive for myalgias. Negative for back pain.  Skin: Negative for rash.  Neurological: Negative for dizziness.  Hematological: Positive for adenopathy. Does not bruise/bleed easily.  Psychiatric/Behavioral: Negative for behavioral problems, confusion and agitation.   Past Medical History  Diagnosis Date  . Seasonal allergies   . IBS (irritable bowel syndrome)   . Heart murmur Dr Johnsie Cancel    dx 2012--  Benign--  per echo abnormal mitral inflow  . History of gastrointestinal ulcer   . Complex cyst of left ovary   . Endometrial polyp   . Wears contact lenses     History   Social History  . Marital Status: Married    Spouse Name: N/A  . Number of Children: 2  . Years of Education: N/A   Occupational History  . preschool teacher    Social History Main Topics  . Smoking status: Never Smoker   . Smokeless tobacco: Never Used  . Alcohol Use: No  . Drug Use: No  . Sexual Activity: Not on file   Other Topics Concern  . Not on file   Social History Narrative   Married, Aeronautical engineer 2  daughters   1-2 caffeinated beverages/day    Past Surgical History  Procedure Laterality Date  . Transthoracic echocardiogram  08-12-2007    normal LV,  ef 65-70%,  abnormal mitral inflow with trivial MR,  trivial PR and TR  . Plantar fascia surgery Left 2009    topaz procedure  . Colonoscopy with propofol  last one 04-28-2014  . Dilatation & currettage/hysteroscopy with resectocope N/A 06/06/2014    Procedure: DILATATION & CURETTAGE/HYSTEROSCOPY WITH RESECTOCOPE;  Surgeon: [name], MD;  Location: [facility];  Service: Gynecology;  Laterality: N/A;  . Laparoscopic salpingo oopherectomy Left 06/06/2014    Procedure: LAPAROSCOPIC LEFT SALPINGO OOPHORECTOMY;  Surgeon: [name], MD;  Location: [facility];  Service: Gynecology;  Laterality: Left;  . Cystoscopy N/A 06/06/2014    Procedure: CYSTOSCOPY;  Surgeon: [name], MD;  Location[facility];  Service: Gynecology;  Laterality: N/A;    Family History  Problem Relation Age of Onset  . Glaucoma Mother   . Hypertension Mother   . Hyperlipidemia Mother   . Heart disease Paternal Grandfather   . Tuberculosis Maternal Grandfather   . Diabetes Maternal Grandfather   . Dementia Paternal Grandmother   . Tuberculosis    . Colon cancer Neg Hx   . Stomach cancer Neg Hx     No Known Allergies  Current Outpatient Prescriptions on File Prior to Visit  Medication Sig Dispense Refill  . benzonatate (TESSALON) 100 MG capsule Take 1 capsule (100 mg total) by mouth  3 (three) times daily as needed for cough. 21 capsule 0  . cetirizine (ZYRTEC) 10 MG tablet Take 10 mg by mouth as needed for allergies.    . fexofenadine (ALLEGRA) 180 MG tablet Take 180 mg by mouth daily.    . fluticasone (FLONASE) 50 MCG/ACT nasal spray Place 2 sprays into both nostrils daily. 16 g 0  . Ibuprofen 200 MG CAPS Take by mouth as needed.    . Multiple Vitamins-Minerals (AIRBORNE PO) Take by mouth daily.    .  pseudoephedrine (SUDAFED) 120 MG 12 hr tablet Take 120 mg by mouth every 12 (twelve) hours as needed for congestion.     No current facility-administered medications on file prior to visit.    BP 124/73 mmHg  Pulse 82  Temp(Src) 98.1 F (36.7 C) (Oral)  Ht 5' 1.75" (1.568 m)  Wt 145 lb (65.772 kg)  BMI 26.75 kg/m2  SpO2 99%  LMP 01/31/2014      Objective:   Physical Exam  General- No acute distress, pleasant pt.  Neck- from, No nuccal rigidity, Mild submandibular node hypertrophy.  Lungs- Clear even and unlabored.  Heart- Regular, rate and rhythm. HEENT- Head- normocephalic Eyes- PEERL bilaterally. Ears- Canals clear, normal tm's bilaterally. Nose- No frontal or maxillary sinus tenderness to palpation. Turbinates normal. Throat- posterior pharynx shows 1+    tonsillar hypertrophy plus,  Moderate erythma, no discharge.   Neurologic- CN III- XII grossly intact.      Assessment & Plan:

## 2014-06-21 NOTE — Assessment & Plan Note (Addendum)
Your strep test was positive. I am prescribing amoxicillin antibiotic. Rest hydrate, tylenol for fever and warm salt water gargles. Follow up in 7 days or as needed. 

## 2014-06-21 NOTE — Progress Notes (Signed)
Pre visit review using our clinic review tool, if applicable. No additional management support is needed unless otherwise documented below in the visit note. 

## 2014-07-27 ENCOUNTER — Encounter: Payer: Self-pay | Admitting: Family Medicine

## 2014-07-27 ENCOUNTER — Ambulatory Visit (INDEPENDENT_AMBULATORY_CARE_PROVIDER_SITE_OTHER): Payer: 59 | Admitting: Family Medicine

## 2014-07-27 VITALS — BP 118/80 | HR 89 | Ht 61.75 in | Wt 150.0 lb

## 2014-07-27 DIAGNOSIS — M216X9 Other acquired deformities of unspecified foot: Secondary | ICD-10-CM | POA: Diagnosis not present

## 2014-07-27 DIAGNOSIS — M722 Plantar fascial fibromatosis: Secondary | ICD-10-CM | POA: Diagnosis not present

## 2014-07-27 HISTORY — DX: Other acquired deformities of unspecified foot: M21.6X9

## 2014-07-27 HISTORY — DX: Plantar fascial fibromatosis: M72.2

## 2014-07-27 NOTE — Assessment & Plan Note (Signed)
Discussed with patient at this time that some of the foot breakdown is causing some of the discomfort. We discussed proper shoewear, icing protocol, topical anti-inflammatories and over-the-counter natural supplementations. Patient come back for orthotics in the near future and then follow-up with me in 4 weeks.

## 2014-07-27 NOTE — Patient Instructions (Signed)
Good to see you We will get you orthotics Ice bath 10 minutes at night Exercises 3 times a week.  On step drop heels then up on toes and hold 2 seconds and lower slowly for count of 4 repeat 30 reps daily.  Pennsaid topically 2 times daily Try turmeric 500mg  twice daily Elizabeth Meyer your feet are worse :) See me again then in 3-4 weeks.

## 2014-07-27 NOTE — Progress Notes (Signed)
[name] [address], [place] [address] Phone: [phone] Subjective:    I'm seeing this patient by the request  of:  Garnet Koyanagi, DO   CC: right foot pain  CHE:[id number] Elizabeth Meyer is a 54 y.o. female coming in with complaint of right foot pain. Patient states that the pain is worse with the first at the morning. Seems to get better with activity. Denies any numbness or tingling. Patient states that it is sore after sitting a long amount of time. Patient does like to walk a lot and has decreasing irritated this discomfort. Patient rates the severity of pain a 6 out of 10. No numbness or weakness. No injury. No nighttime awakening. Has not responded to over-the-counter anti-inflammatories. History of previous plantar fascial resection on contralateral foot.  Past Medical History  Diagnosis Date  . Seasonal allergies   . IBS (irritable bowel syndrome)   . Heart murmur Dr Johnsie Cancel    dx 2012--  Benign--  per echo abnormal mitral inflow  . History of gastrointestinal ulcer   . Complex cyst of left ovary   . Endometrial polyp   . Wears contact lenses    Past Surgical History  Procedure Laterality Date  . Transthoracic echocardiogram  08-12-2007    normal LV,  ef 65-70%,  abnormal mitral inflow with trivial MR,  trivial PR and TR  . Plantar fascia surgery Left 2009    topaz procedure  . Colonoscopy with propofol  last one 04-28-2014  . Dilatation & currettage/hysteroscopy with resectocope N/A 06/06/2014    Procedure: DILATATION & CURETTAGE/HYSTEROSCOPY WITH RESECTOCOPE;  Surgeon: [name], MD;  Location: [facility];  Service: Gynecology;  Laterality: N/A;  . Laparoscopic salpingo oopherectomy Left 06/06/2014    Procedure: LAPAROSCOPIC LEFT SALPINGO OOPHORECTOMY;  Surgeon: [name], MD;  Location: [facility];  Service: Gynecology;  Laterality: Left;  . Cystoscopy N/A 06/06/2014    Procedure:  CYSTOSCOPY;  Surgeon: [name], MD;  Location: [facility];  Service: Gynecology;  Laterality: N/A;   Family History  Problem Relation Age of Onset  . Glaucoma Mother   . Hypertension Mother   . Hyperlipidemia Mother   . Heart disease Paternal Grandfather   . Tuberculosis Maternal Grandfather   . Diabetes Maternal Grandfather   . Dementia Paternal Grandmother   . Tuberculosis    . Colon cancer Neg Hx   . Stomach cancer Neg Hx    History  Substance Use Topics  . Smoking status: Never Smoker   . Smokeless tobacco: Never Used  . Alcohol Use: No   No Known Allergies     Past medical history, social, surgical and family history all reviewed in electronic medical record.   Review of Systems: No headache, visual changes, nausea, vomiting, diarrhea, constipation, dizziness, abdominal pain, skin rash, fevers, chills, night sweats, weight loss, swollen lymph nodes, body aches, joint swelling, muscle aches, chest pain, shortness of breath, mood changes.   Objective Blood pressure 118/80, pulse 89, height 5' 1.75" (1.568 m), weight 150 lb (68.04 kg), SpO2 98 %.  General: No apparent distress alert and oriented x3 mood and affect normal, dressed appropriately.  HEENT: Pupils equal, extraocular movements intact  Respiratory: Patient's speak in full sentences and does not appear short of breath  Cardiovascular: No lower extremity edema, non tender, no erythema  Skin: Warm dry intact with no signs of infection or rash on extremities or on  axial skeleton.  Abdomen: Soft nontender  Neuro: Cranial nerves II through XII are intact, neurovascularly intact in all extremities with 2+ DTRs and 2+ pulses.  Lymph: No lymphadenopathy of posterior or anterior cervical chain or axillae bilaterally.  Gait normal with good balance and coordination.  MSK:  Non tender with full range of motion and good stability and symmetric strength and tone of shoulders, elbows, wrist, hip, knee and  ankles bilaterally.  Normal inspection with no visable or palpable fat pad atrophy and no visible swelling/erythema. Patient is tender at medial insertion of plantar fascia into calcaneus on right foot. 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  Limited muscular skeletal ultrasound was performed and interpreted by Hulan Saas, M  Limited ultrasound of the plantar fascia on the right foot shows enlargement at 1.6 cm. Impression.Plantar fascitis     Impression and Recommendations:     This case required medical decision making of moderate complexity.

## 2014-07-27 NOTE — Progress Notes (Signed)
Pre visit review using our clinic review tool, if applicable. No additional management support is needed unless otherwise documented below in the visit note. 

## 2014-08-04 ENCOUNTER — Ambulatory Visit (INDEPENDENT_AMBULATORY_CARE_PROVIDER_SITE_OTHER): Payer: 59 | Admitting: Family Medicine

## 2014-08-04 ENCOUNTER — Encounter: Payer: Self-pay | Admitting: Family Medicine

## 2014-08-04 DIAGNOSIS — M722 Plantar fascial fibromatosis: Secondary | ICD-10-CM

## 2014-08-04 NOTE — Progress Notes (Signed)
Patient was fitted for a : standard, cushioned, semi-rigid orthotic. The orthotic was heated and afterward the patient was in a seated position and the orthotic molded. The patient was positioned in subtalar neutral position and 10 degrees of ankle dorsiflexion in a non-weight bearing stance. After completion of molding, patient did have orthotic management which included instructions on acclimating to the orthotics, signs of ill fit as well as care for the orthotic.  I spent 43minutes with the patient addressing the fit of the orthotics, care of the orthotics and transferring between shoes.   The blank was ground to a stable position for weight bearing. Size: 7 (Igli Heel Spur Light)  Base: Carbon fiber Additional Posting and Padding: The following postings were fitted onto the molded orthotics to help maintain a talar neutral position - Wedge posting for transverse arch:  none     Silicone posting for longitudinal arch: 250/70 Bilaterally  The patient ambulated these, and they were very comfortable and supportive.

## 2014-08-04 NOTE — Assessment & Plan Note (Signed)
Patient was given instructions on how to wear these over the course of time. We discussed continuing home exercises in the icing protocol. Patient and will come back and see me again in 2-4 weeks for further evaluation and treatment.

## 2014-08-04 NOTE — Patient Instructions (Signed)

## 2014-08-25 ENCOUNTER — Ambulatory Visit: Payer: 59 | Admitting: Family Medicine

## 2014-08-29 ENCOUNTER — Ambulatory Visit (INDEPENDENT_AMBULATORY_CARE_PROVIDER_SITE_OTHER): Payer: 59 | Admitting: Physician Assistant

## 2014-08-29 ENCOUNTER — Encounter: Payer: Self-pay | Admitting: Physician Assistant

## 2014-08-29 VITALS — BP 117/69 | HR 70 | Temp 98.1°F | Ht 61.75 in | Wt 149.2 lb

## 2014-08-29 DIAGNOSIS — J0110 Acute frontal sinusitis, unspecified: Secondary | ICD-10-CM | POA: Diagnosis not present

## 2014-08-29 MED ORDER — AZITHROMYCIN 250 MG PO TABS: ORAL_TABLET | ORAL | Status: DC

## 2014-08-29 NOTE — Patient Instructions (Signed)
Please take antibiotic as directed.  Increase fluid intake.  Use Saline nasal spray.  Take a daily multivitamin. Keep eyes hydrated with re-wetting drops and avoid contact use over the next few days.  Place a humidifier in the bedroom.  Please call or return clinic if symptoms are not improving.  Sinusitis Sinusitis is redness, soreness, and swelling (inflammation) of the paranasal sinuses. Paranasal sinuses are air pockets within the bones of your face (beneath the eyes, the middle of the forehead, or above the eyes). In healthy paranasal sinuses, mucus is able to drain out, and air is able to circulate through them by way of your nose. However, when your paranasal sinuses are inflamed, mucus and air can become trapped. This can allow bacteria and other germs to grow and cause infection. Sinusitis can develop quickly and last only a short time (acute) or continue over a long period (chronic). Sinusitis that lasts for more than 12 weeks is considered chronic.  CAUSES  Causes of sinusitis include:  Allergies.  Structural abnormalities, such as displacement of the cartilage that separates your nostrils (deviated septum), which can decrease the air flow through your nose and sinuses and affect sinus drainage.  Functional abnormalities, such as when the small hairs (cilia) that line your sinuses and help remove mucus do not work properly or are not present. SYMPTOMS  Symptoms of acute and chronic sinusitis are the same. The primary symptoms are pain and pressure around the affected sinuses. Other symptoms include:  Upper toothache.  Earache.  Headache.  Bad breath.  Decreased sense of smell and taste.  A cough, which worsens when you are lying flat.  Fatigue.  Fever.  Thick drainage from your nose, which often is green and may contain pus (purulent).  Swelling and warmth over the affected sinuses. DIAGNOSIS  Your caregiver will perform a physical exam. During the exam, your caregiver  may:  Look in your nose for signs of abnormal growths in your nostrils (nasal polyps).  Tap over the affected sinus to check for signs of infection.  View the inside of your sinuses (endoscopy) with a special imaging device with a light attached (endoscope), which is inserted into your sinuses. If your caregiver suspects that you have chronic sinusitis, one or more of the following tests may be recommended:  Allergy tests.  Nasal culture A sample of mucus is taken from your nose and sent to a lab and screened for bacteria.  Nasal cytology A sample of mucus is taken from your nose and examined by your caregiver to determine if your sinusitis is related to an allergy. TREATMENT  Most cases of acute sinusitis are related to a viral infection and will resolve on their own within 10 days. Sometimes medicines are prescribed to help relieve symptoms (pain medicine, decongestants, nasal steroid sprays, or saline sprays).  However, for sinusitis related to a bacterial infection, your caregiver will prescribe antibiotic medicines. These are medicines that will help kill the bacteria causing the infection.  Rarely, sinusitis is caused by a fungal infection. In theses cases, your caregiver will prescribe antifungal medicine. For some cases of chronic sinusitis, surgery is needed. Generally, these are cases in which sinusitis recurs more than 3 times per year, despite other treatments. HOME CARE INSTRUCTIONS   Drink plenty of water. Water helps thin the mucus so your sinuses can drain more easily.  Use a humidifier.  Inhale steam 3 to 4 times a day (for example, sit in the bathroom with the shower running).  Apply a warm, moist washcloth to your face 3 to 4 times a day, or as directed by your caregiver.  Use saline nasal sprays to help moisten and clean your sinuses.  Take over-the-counter or prescription medicines for pain, discomfort, or fever only as directed by your caregiver. SEEK IMMEDIATE  MEDICAL CARE IF:  You have increasing pain or severe headaches.  You have nausea, vomiting, or drowsiness.  You have swelling around your face.  You have vision problems.  You have a stiff neck.  You have difficulty breathing. MAKE SURE YOU:   Understand these instructions.  Will watch your condition.  Will get help right away if you are not doing well or get worse. Document Released: 03/25/2005 Document Revised: 06/17/2011 Document Reviewed: 04/09/2011 [facility] Patient Information 2014 [place], [place].

## 2014-08-29 NOTE — Progress Notes (Signed)
Patient presents to clinic today c/o 5 days of worsening sinus pressure, sinus pain, PND, congestion and voice hoarseness. Noticed her eyes were sticky this morning.  Denies drainage, eye pain or change in vision. Denies fever, chills, chest pain or SOB.  Past Medical History  Diagnosis Date  . Seasonal allergies   . IBS (irritable bowel syndrome)   . Heart murmur Dr Johnsie Cancel    dx 2012--  Benign--  per echo abnormal mitral inflow  . History of gastrointestinal ulcer   . Complex cyst of left ovary   . Endometrial polyp   . Wears contact lenses     Current Outpatient Prescriptions on File Prior to Visit  Medication Sig Dispense Refill  . cetirizine (ZYRTEC) 10 MG tablet Take 10 mg by mouth as needed for allergies.    . Ibuprofen 200 MG CAPS Take by mouth as needed.    . Multiple Vitamins-Minerals (AIRBORNE PO) Take by mouth daily.    . pseudoephedrine (SUDAFED) 120 MG 12 hr tablet Take 120 mg by mouth every 12 (twelve) hours as needed for congestion.    . fexofenadine (ALLEGRA) 180 MG tablet Take 180 mg by mouth daily.    . fluticasone (FLONASE) 50 MCG/ACT nasal spray Place 2 sprays into both nostrils daily. (Patient not taking: Reported on 08/29/2014) 16 g 0   No current facility-administered medications on file prior to visit.    No Known Allergies  Family History  Problem Relation Age of Onset  . Glaucoma Mother   . Hypertension Mother   . Hyperlipidemia Mother   . Heart disease Paternal Grandfather   . Tuberculosis Maternal Grandfather   . Diabetes Maternal Grandfather   . Dementia Paternal Grandmother   . Tuberculosis    . Colon cancer Neg Hx   . Stomach cancer Neg Hx     History   Social History  . Marital Status: Married    Spouse Name: N/A  . Number of Children: 2  . Years of Education: N/A   Occupational History  . preschool teacher    Social History Main Topics  . Smoking status: Never Smoker   . Smokeless tobacco: Never Used  . Alcohol Use: No  .  Drug Use: No  . Sexual Activity: Not on file   Other Topics Concern  . None   Social History Narrative   Married, Aeronautical engineer 2 daughters   1-2 caffeinated beverages/day    Review of Systems - See HPI.  All other ROS are negative.  BP 117/69 mmHg  Pulse 70  Temp(Src) 98.1 F (36.7 C) (Oral)  Ht 5' 1.75" (1.568 m)  Wt 149 lb 3.2 oz (67.677 kg)  BMI 27.53 kg/m2  SpO2 100%  LMP 08/28/2014  Physical Exam  Constitutional: She is oriented to person, place, and time and well-developed, well-nourished, and in no distress.  HENT:  Head: Normocephalic and atraumatic.  Right Ear: Tympanic membrane, external ear and ear canal normal.  Left Ear: Tympanic membrane, external ear and ear canal normal.  Nose: Right sinus exhibits frontal sinus tenderness. Left sinus exhibits frontal sinus tenderness.  Mouth/Throat: Uvula is midline and mucous membranes are normal. No oropharyngeal exudate, posterior oropharyngeal edema, posterior oropharyngeal erythema or tonsillar abscesses.  Eyes: EOM and lids are normal. Pupils are equal, round, and reactive to light. Lids are everted and swept, no foreign bodies found. Right eye exhibits no discharge and no exudate. Left eye exhibits no discharge and no exudate. Right conjunctiva is not injected. Right  conjunctiva has no hemorrhage. Left conjunctiva is not injected. Left conjunctiva has no hemorrhage.  Cardiovascular: Normal rate, regular rhythm and normal heart sounds.   Pulmonary/Chest: Effort normal and breath sounds normal. No respiratory distress. She has no wheezes. She has no rales. She exhibits no tenderness.  Neurological: She is alert and oriented to person, place, and time.  Skin: Skin is warm and dry. No rash noted.  Psychiatric: Affect normal.  Vitals reviewed.   Recent Results (from the past 2160 hour(s))  CBC     Status: None   Collection Time: 06/02/14  2:08 PM  Result Value Ref Range   WBC 7.6 4.0 - 10.5 K/uL   RBC 4.08 3.87 -  5.11 MIL/uL   Hemoglobin 12.5 12.0 - 15.0 g/dL   HCT 37.1 36.0 - 46.0 %   MCV 90.9 78.0 - 100.0 fL   MCH 30.6 26.0 - 34.0 pg   MCHC 33.7 30.0 - 36.0 g/dL   RDW 12.6 11.5 - 15.5 %   Platelets 283 150 - 400 K/uL  POCT rapid strep A     Status: Abnormal   Collection Time: 06/21/14 11:47 AM  Result Value Ref Range   Rapid Strep A Screen Positive (A) Negative    Assessment/Plan: Sinusitis, acute frontal Rx z-pack.  Increase fluids.  Rest.  Saline nasal spray.  Probiotic.  Mucinex as directed.  Humidifier in bedroom. Supportive measures reviewed.  Call or return to clinic if symptoms are not improving.

## 2014-08-29 NOTE — Progress Notes (Signed)
Pre visit review using our clinic review tool, if applicable. No additional management support is needed unless otherwise documented below in the visit note. 

## 2014-08-29 NOTE — Assessment & Plan Note (Signed)
Rx z-pack.  Increase fluids.  Rest.  Saline nasal spray.  Probiotic.  Mucinex as directed.  Humidifier in bedroom. Supportive measures reviewed.  Call or return to clinic if symptoms are not improving.

## 2014-09-06 ENCOUNTER — Encounter: Payer: Self-pay | Admitting: Family Medicine

## 2014-09-06 ENCOUNTER — Ambulatory Visit (INDEPENDENT_AMBULATORY_CARE_PROVIDER_SITE_OTHER): Payer: 59 | Admitting: Family Medicine

## 2014-09-06 VITALS — BP 102/64 | HR 88 | Ht 61.75 in | Wt 149.0 lb

## 2014-09-06 DIAGNOSIS — M722 Plantar fascial fibromatosis: Secondary | ICD-10-CM

## 2014-09-06 NOTE — Progress Notes (Signed)
[name] [place], [place] [address] Phone: [phone] Subjective:    I'm seeing this patient by the request  of:  Garnet Koyanagi, DO   CC: right foot pain  [id number] Elizabeth Meyer is a 54 y.o. female coming in with complaint of right foot pain. Patient was found to have more plantar fasciitis with nearly 2 times the size on ultrasound. Patient was put in orthotics, icing protocol, home exercises, and we discussed over-the-counter natural supplementations. Patient states she is doing moderately better. Patient still has some mild pain in the mornings. Patient is not doing the exercises on a regular basis. Patient denies any tingling or any numbness. Overall patient is able to do daily activities and does not to concern as long she wears proper shoes and the orthotics.   History of previous plantar fascial resection on contralateral foot.  Past Medical History  Diagnosis Date  . Seasonal allergies   . IBS (irritable bowel syndrome)   . Heart murmur Dr Johnsie Cancel    dx 2012--  Benign--  per echo abnormal mitral inflow  . History of gastrointestinal ulcer   . Complex cyst of left ovary   . Endometrial polyp   . Wears contact lenses    Past Surgical History  Procedure Laterality Date  . Transthoracic echocardiogram  08-12-2007    normal LV,  ef 65-70%,  abnormal mitral inflow with trivial MR,  trivial PR and TR  . Plantar fascia surgery Left 2009    topaz procedure  . Colonoscopy with propofol  last one 04-28-2014  . Dilatation & currettage/hysteroscopy with resectocope N/A 06/06/2014    Procedure: DILATATION & CURETTAGE/HYSTEROSCOPY WITH RESECTOCOPE;  Surgeon: [name], MD;  Location: [facility];  Service: Gynecology;  Laterality: N/A;  . Laparoscopic salpingo oopherectomy Left 06/06/2014    Procedure: LAPAROSCOPIC LEFT SALPINGO OOPHORECTOMY;  Surgeon: [name], MD;  Location[facility];   Service: Gynecology;  Laterality: Left;  . Cystoscopy N/A 06/06/2014    Procedure: CYSTOSCOPY;  Surgeon: [name], MD;  Location[facility];  Service: Gynecology;  Laterality: N/A;   Family History  Problem Relation Age of Onset  . Glaucoma Mother   . Hypertension Mother   . Hyperlipidemia Mother   . Heart disease Paternal Grandfather   . Tuberculosis Maternal Grandfather   . Diabetes Maternal Grandfather   . Dementia Paternal Grandmother   . Tuberculosis    . Colon cancer Neg Hx   . Stomach cancer Neg Hx    History  Substance Use Topics  . Smoking status: Never Smoker   . Smokeless tobacco: Never Used  . Alcohol Use: No   No Known Allergies     Past medical history, social, surgical and family history all reviewed in electronic medical record.   Review of Systems: No headache, visual changes, nausea, vomiting, diarrhea, constipation, dizziness, abdominal pain, skin rash, fevers, chills, night sweats, weight loss, swollen lymph nodes, body aches, joint swelling, muscle aches, chest pain, shortness of breath, mood changes.   Objective Blood pressure 102/64, pulse 88, height 5' 1.75" (1.568 m), weight 149 lb (67.586 kg), last menstrual period 08/28/2014, SpO2 98 %.  General: No apparent distress alert and oriented x3 mood and affect normal, dressed appropriately.  HEENT: Pupils equal, extraocular movements intact  Respiratory: Patient's speak in full sentences and does not appear short of breath  Cardiovascular: No lower extremity edema, non tender, no erythema  Skin: Warm dry intact with no signs of infection or rash on extremities or on axial skeleton.  Abdomen: Soft nontender  Neuro: Cranial nerves II through XII are intact, neurovascularly intact in all extremities with 2+ DTRs and 2+ pulses.  Lymph: No lymphadenopathy of posterior or anterior cervical chain or axillae bilaterally.  Gait normal with good balance and coordination.  MSK:  Non tender with  full range of motion and good stability and symmetric strength and tone of shoulders, elbows, wrist, hip, knee and ankles bilaterally.  Normal inspection with no visable or palpable fat pad atrophy and no visible swelling/erythema. Significant decreased pain over the medial calcaneal region Great toe motion:mild rigidity of the first metatarsal bilaterally Arch shape:patient does have breakdown of the transverse arch bilaterally with a Morton's toe.  Limited muscular skeletal ultrasound was performed and interpreted by Hulan Saas, M  Limited ultrasound of the plantar fascia on the right foot shows enlargement at 1.09 cm down from 1.67 m. Impression.Plantar fascitis     Impression and Recommendations:     This case required medical decision making of moderate complexity.

## 2014-09-06 NOTE — Progress Notes (Signed)
Pre visit review using our clinic review tool, if applicable. No additional management support is needed unless otherwise documented below in the visit note. 

## 2014-09-06 NOTE — Assessment & Plan Note (Signed)
Down in size at this time. We discussed icing as well as the home exercises on a regular basis. Patient is in a refresher course on what activities and exercises to do. We discussed potentially the over-the-counter natural supplementations another treatment options including nitroglycerin but patient has a history of migraines as well as the possibility of injection and patient states that the pain is not that bad. Patient will follow-up on an as-needed basis.

## 2014-09-06 NOTE — Patient Instructions (Signed)
Good to see you You are doing great On step drop heels then go on toes hold for 2 seconds then down slow for count of 4 seconds. 30 reps daily.  See me again when you need me or need an injection.

## 2015-09-15 ENCOUNTER — Encounter: Payer: Self-pay | Admitting: Physician Assistant

## 2015-09-15 ENCOUNTER — Ambulatory Visit (INDEPENDENT_AMBULATORY_CARE_PROVIDER_SITE_OTHER): Payer: 59 | Admitting: Physician Assistant

## 2015-09-15 VITALS — BP 108/69 | HR 63 | Temp 98.4°F | Resp 16 | Ht 61.75 in | Wt 147.0 lb

## 2015-09-15 DIAGNOSIS — B029 Zoster without complications: Secondary | ICD-10-CM | POA: Diagnosis not present

## 2015-09-15 NOTE — Patient Instructions (Signed)
The rash/patch that you show me seems most consistent with a mild shingles rash. It looks like it is starting to heal. Please keep covered. Sarna lotion and benadryl or claritin can help with itch.  Symptoms should resolve themselves over the next week. We are outside the window where medication can be given. Your body will fight this off.   If symptoms are not resolving or if you notice spots elsewhere, call me and let me know.  Shingles Shingles is an infection that causes a painful skin rash and fluid-filled blisters. Shingles is caused by the same virus that causes chickenpox. Shingles only develops in people who:  Have had chickenpox.  Have gotten the chickenpox vaccine. (This is rare.) The first symptoms of shingles may be itching, tingling, or pain in an area on your skin. A rash will follow in a few days or weeks. The rash is usually on one side of the body in a bandlike or beltlike pattern. Over time, the rash turns into fluid-filled blisters that break open, scab over, and dry up. Medicines may:  Help you manage pain.  Help you recover more quickly.  Help to prevent long-term problems. HOME CARE Medicines  Take medicines only as told by your doctor.  Apply an anti-itch or numbing cream to the affected area as told by your doctor. Blister and Rash Care  Take a cool bath or put cool compresses on the area of the rash or blisters as told by your doctor. This may help with pain and itching.  Keep your rash covered with a loose bandage (dressing). Wear loose-fitting clothing.  Keep your rash and blisters clean with mild soap and cool water or as told by your doctor.  Check your rash every day for signs of infection. These include redness, swelling, and pain that lasts or gets worse.  Do not pick your blisters.  Do not scratch your rash. General Instructions  Rest as told by your doctor.  Keep all follow-up visits as told by your doctor. This is important.  Until  your blisters scab over, your infection can cause chickenpox in people who have never had it or been vaccinated against it. To prevent this from happening, avoid touching other people or being around other people, especially:  Babies.  Pregnant women.  Children who have eczema.  Elderly people who have transplants.  People who have chronic illnesses, such as leukemia or AIDS. GET HELP IF:  Your pain does not get better with medicine.  Your pain does not get better after the rash heals.  Your rash looks infected. Signs of infection include:  Redness.  Swelling.  Pain that lasts or gets worse. GET HELP RIGHT AWAY IF:  The rash is on your face or nose.  You have pain in your face, pain around your eye area, or loss of feeling on one side of your face.  You have ear pain or you have ringing in your ear.  You have loss of taste.  Your condition gets worse.   This information is not intended to replace advice given to you by your health care provider. Make sure you discuss any questions you have with your health care provider.   Document Released: 09/11/2007 Document Revised: 04/15/2014 Document Reviewed: 01/04/2014 Elsevier Interactive Patient Education [employer].

## 2015-09-15 NOTE — Progress Notes (Signed)
Pre visit review using our clinic review tool, if applicable. No additional management support is needed unless otherwise documented below in the visit note/SLS  

## 2015-09-15 NOTE — Progress Notes (Signed)
    Patient presents to clinic today c/o burning and tingling of skin of thoracic back just left of spine starting 2 weeks ago. Notes this past weekend she developed a rash at the site. Has not spread. Endorses tingling and itching have persisted. Denies fever, chills, malaise/fatigue. Denies change to medications, soaps, lotions, detergents. Denies sick contact.  Past Medical History  Diagnosis Date  . Seasonal allergies   . IBS (irritable bowel syndrome)   . Heart murmur Dr Johnsie Cancel    dx 2012--  Benign--  per echo abnormal mitral inflow  . History of gastrointestinal ulcer   . Complex cyst of left ovary   . Endometrial polyp   . Wears contact lenses     Current Outpatient Prescriptions on File Prior to Visit  Medication Sig Dispense Refill  . cetirizine (ZYRTEC) 10 MG tablet Take 10 mg by mouth as needed for allergies.    . fexofenadine (ALLEGRA) 180 MG tablet Take 180 mg by mouth daily.    . fluticasone (FLONASE) 50 MCG/ACT nasal spray Place 2 sprays into both nostrils daily. 16 g 0  . Ibuprofen 200 MG CAPS Take by mouth as needed.    . Multiple Vitamins-Minerals (AIRBORNE PO) Take by mouth daily as needed.     . pseudoephedrine (SUDAFED) 120 MG 12 hr tablet Take 120 mg by mouth every 12 (twelve) hours as needed for congestion.     No current facility-administered medications on file prior to visit.    No Known Allergies  Family History  Problem Relation Age of Onset  . Glaucoma Mother   . Hypertension Mother   . Hyperlipidemia Mother   . Heart disease Paternal Grandfather   . Tuberculosis Maternal Grandfather   . Diabetes Maternal Grandfather   . Dementia Paternal Grandmother   . Tuberculosis    . Colon cancer Neg Hx   . Stomach cancer Neg Hx     Social History   Social History  . Marital Status: Married    Spouse Name: N/A  . Number of Children: 2  . Years of Education: N/A   Occupational History  . preschool teacher    Social History Main Topics  . Smoking  status: Never Smoker   . Smokeless tobacco: Never Used  . Alcohol Use: No  . Drug Use: No  . Sexual Activity: Not Asked   Other Topics Concern  . None   Social History Narrative   Married, Aeronautical engineer 2 daughters   1-2 caffeinated beverages/day    Review of Systems - See HPI.  All other ROS are negative.  Ht 5' 1.75" (1.568 m)  Wt 147 lb (66.679 kg)  BMI 27.12 kg/m2  LMP 08/25/2015  Physical Exam  Constitutional: She is oriented to person, place, and time and well-developed, well-nourished, and in no distress.  HENT:  Head: Normocephalic and atraumatic.  Cardiovascular: Normal rate, regular rhythm, normal heart sounds and intact distal pulses.   Pulmonary/Chest: Effort normal and breath sounds normal. No respiratory distress. She has no wheezes. She has no rales. She exhibits no tenderness.  Neurological: She is alert and oriented to person, place, and time.  Skin:     Psychiatric: Affect normal.  Vitals reviewed.   No results found for this or any previous visit (from the past 2160 hour(s)).  Assessment/Plan: 1. Shingles rash Outside window for treatment. Area is healing well. Discussed supportive measures and OTC medications for itch. Follow-up if not resolving.  Leeanne Rio, PA-C

## 2015-09-16 ENCOUNTER — Ambulatory Visit (INDEPENDENT_AMBULATORY_CARE_PROVIDER_SITE_OTHER): Payer: 59 | Admitting: Family Medicine

## 2015-09-16 ENCOUNTER — Encounter: Payer: Self-pay | Admitting: Family Medicine

## 2015-09-16 VITALS — BP 102/70 | HR 79 | Temp 98.9°F | Resp 18 | Ht 61.75 in | Wt 146.0 lb

## 2015-09-16 DIAGNOSIS — L200 Besnier's prurigo: Secondary | ICD-10-CM | POA: Diagnosis not present

## 2015-09-16 DIAGNOSIS — L239 Allergic contact dermatitis, unspecified cause: Secondary | ICD-10-CM

## 2015-09-16 HISTORY — DX: Allergic contact dermatitis, unspecified cause: L23.9

## 2015-09-16 MED ORDER — TRIAMCINOLONE ACETONIDE 0.5 % EX CREA: 1.0000 "application " | TOPICAL_CREAM | Freq: Two times a day (BID) | CUTANEOUS | Status: DC

## 2015-09-16 NOTE — Patient Instructions (Signed)
Apply topical steroid twice daily for two weeks.  If not improving  Follow up with PCP.

## 2015-09-16 NOTE — Progress Notes (Signed)
   Subjective:    Patient ID: Elizabeth Meyer, female    DOB: [birth year], 55 y.o.   MRN: [id number]  HPI   55 year old female presents with new onset rash and prodrome of soreness started  2 week ago.  Dx with  Shingles She was outside of window of antivirals. No treatment given.   She has  now noted more lesions appear on left, now on bilateral breast, now both hips, torso.  Lesions are tingling itchy, burning.  No fever, no flu like illness.  no oral lesion, none on palms or soles.   No new contacts or changes in detergent, medications.  She has been using aspercream with lidocaine and benadryl.   Hx no skin issues.      Review of Systems  Constitutional: Negative for fever and fatigue.  HENT: Negative for ear pain.   Eyes: Negative for pain.  Respiratory: Negative for chest tightness and shortness of breath.   Cardiovascular: Negative for chest pain, palpitations and leg swelling.  Gastrointestinal: Negative for abdominal pain.  Genitourinary: Negative for dysuria.       Objective:   Physical Exam  Constitutional: Vital signs are normal. She appears well-developed and well-nourished. She is cooperative.  Non-toxic appearance. She does not appear ill. No distress.  HENT:  Head: Normocephalic.  Right Ear: Hearing, tympanic membrane, external ear and ear canal normal. Tympanic membrane is not erythematous, not retracted and not bulging.  Left Ear: Hearing, tympanic membrane, external ear and ear canal normal. Tympanic membrane is not erythematous, not retracted and not bulging.  Nose: No mucosal edema or rhinorrhea. Right sinus exhibits no maxillary sinus tenderness and no frontal sinus tenderness. Left sinus exhibits no maxillary sinus tenderness and no frontal sinus tenderness.  Mouth/Throat: Uvula is midline, oropharynx is clear and moist and mucous membranes are normal.  Eyes: Conjunctivae, EOM and lids are normal. Pupils are equal, round, and reactive to light.  Lids are everted and swept, no foreign bodies found.  Neck: Trachea normal and normal range of motion. Neck supple. Carotid bruit is not present. No thyroid mass and no thyromegaly present.  Cardiovascular: Normal rate, regular rhythm, S1 normal, S2 normal, normal heart sounds, intact distal pulses and normal pulses.  Exam reveals no gallop and no friction rub.   No murmur heard. Pulmonary/Chest: Effort normal and breath sounds normal. No tachypnea. No respiratory distress. She has no decreased breath sounds. She has no wheezes. She has no rhonchi. She has no rales.  Abdominal: Soft. Normal appearance and bowel sounds are normal. There is no tenderness.  Neurological: She is alert.  Skin: Skin is warm, dry and intact. Rash noted. Rash is maculopapular. Rash is not papular and not vesicular.  Dry flaky lesions on torso and back.. None on arms and legs or in mouth  Psychiatric: Her speech is normal and behavior is normal. Judgment and thought content normal. Her mood appears not anxious. Cognition and memory are normal. She does not exhibit a depressed mood.          Assessment & Plan:

## 2015-09-16 NOTE — Assessment & Plan Note (Signed)
No longer consistent with shingles. More likely allergic dermatitis. Continue antihistamine, use moisturizing cream and use topical steroid.

## 2015-09-16 NOTE — Progress Notes (Signed)
Pre visit review using our clinic review tool, if applicable. No additional management support is needed unless otherwise documented below in the visit note. 

## 2016-02-17 LAB — GLUCOSE, POCT (MANUAL RESULT ENTRY): POC Glucose: 89 mg/dl (ref 70–99)

## 2016-04-11 ENCOUNTER — Encounter: Payer: Self-pay | Admitting: Family Medicine

## 2016-04-11 ENCOUNTER — Ambulatory Visit (INDEPENDENT_AMBULATORY_CARE_PROVIDER_SITE_OTHER): Payer: 59 | Admitting: Family Medicine

## 2016-04-11 VITALS — BP 118/64 | HR 85 | Temp 97.8°F | Ht 62.0 in | Wt 146.2 lb

## 2016-04-11 DIAGNOSIS — J0110 Acute frontal sinusitis, unspecified: Secondary | ICD-10-CM

## 2016-04-11 MED ORDER — AMOXICILLIN-POT CLAVULANATE 875-125 MG PO TABS: 1.0000 | ORAL_TABLET | Freq: Two times a day (BID) | ORAL | 0 refills | Status: DC

## 2016-04-11 NOTE — Progress Notes (Signed)
Chief Complaint  Patient presents with  . Sinusitis    Pt reports congestion drainage yellow mucus and having issues sleeping x1 week , ears also feel full    Elizabeth Meyer is 56 y.o. female here for possible sinus infection.  Duration: 1 week Symptoms include: nasal congestion, clear rhinorrhea, sinus pain (frontal b/l), sneezing, ear pressure Denies: fevers, sore throats, myalgias, SOB Treatment to date: Pseudoephedrine, Dayquil, Flonase (3 days) Sick contacts? No  ROS:  Const: Denies fevers HEENT: As noted in HPI  Past Medical History:  Diagnosis Date  . Complex cyst of left ovary   . Endometrial polyp   . Heart murmur Dr Johnsie Cancel   dx 2012--  Benign--  per echo abnormal mitral inflow  . History of gastrointestinal ulcer   . IBS (irritable bowel syndrome)   . Seasonal allergies   . Wears contact lenses    Social History   Social History  . Marital status: Married  . Number of children: 2   Occupational History  . preschool teacher Homemaker   Social History Main Topics  . Smoking status: Never Smoker  . Smokeless tobacco: Never Used  . Alcohol use No  . Drug use: No   Social History Narrative   Married, Aeronautical engineer 2 daughters   1-2 caffeinated beverages/day   Family History  Problem Relation Age of Onset  . Glaucoma Mother   . Hypertension Mother   . Hyperlipidemia Mother   . Heart disease Paternal Grandfather   . Tuberculosis Maternal Grandfather   . Diabetes Maternal Grandfather   . Dementia Paternal Grandmother   . Tuberculosis    . Colon cancer Neg Hx   . Stomach cancer Neg Hx     BP 118/64 (BP Location: Right Arm, Patient Position: Sitting, Cuff Size: Small)   Pulse 85   Temp 97.8 F (36.6 C) (Oral)   Ht 5\' 2"  (1.575 m)   Wt 146 lb 3.2 oz (66.3 kg)   LMP 03/30/2016   SpO2 98%   BMI 26.74 kg/m  Gen- Awake, alert, appears stated age 29- Ears patent, TM's neg b/l, nares are patent, mild discomfort to palpation over frontal  sinuses, max sinuses are not TTP, MMM, pharynx is not erythematous or with exudate Heart- RRR, no murmur, no bruits Lungs- CTAB, no accessory muscle use Psych- Age appropriate judgment and insight, nml mood and affect  Acute non-recurrent frontal sinusitis - Plan: amoxicillin-clavulanate (AUGMENTIN) 875-125 MG tablet  Orders as above. Discussed abx use and that >95% of sinus infections are viral. Recommended waiting until 10 days in before taking abx or if not improving by day 14.  OTC remedies such as PO antihistamines and continued INCS spray use recommended and listed in AVS. She was using Flonase twice daily, 1 spray. I suggested she use it once daily, 2 sprays each side. Continue to practice good hand hygiene, push fluids, and cover mouth when coughing. F/u prn. The patient voiced understanding and agreement to the plan.  [place], DO 04/11/16 3:23 PM

## 2016-04-11 NOTE — Patient Instructions (Signed)
If you are getting worse by day 10, take antibiotic. If you are not improving by day 14, then take antibiotic.  Claritin (loratadine), Allegra (fexofenadine), Zyrtec (cetirizine); these are listed in order from weakest to strongest. Generic, and therefore cheaper, options are in the parentheses.   Flonase (fluticasone); nasal spray that is over the counter. 2 sprays each nostril, once daily. Aim towards the same side eye when you spray.  There are available OTC, and the generic versions, which may be cheaper, are in parentheses. Show this to a pharmacist if you have trouble finding any of these items.

## 2016-04-11 NOTE — Progress Notes (Signed)
Pre visit review using our clinic review tool, if applicable. No additional management support is needed unless otherwise documented below in the visit note. 

## 2016-05-07 DIAGNOSIS — D1801 Hemangioma of skin and subcutaneous tissue: Secondary | ICD-10-CM | POA: Diagnosis not present

## 2016-05-07 DIAGNOSIS — D2262 Melanocytic nevi of left upper limb, including shoulder: Secondary | ICD-10-CM | POA: Diagnosis not present

## 2016-05-07 DIAGNOSIS — L821 Other seborrheic keratosis: Secondary | ICD-10-CM | POA: Diagnosis not present

## 2016-05-09 DIAGNOSIS — Z01419 Encounter for gynecological examination (general) (routine) without abnormal findings: Secondary | ICD-10-CM | POA: Diagnosis not present

## 2016-05-15 DIAGNOSIS — Z1231 Encounter for screening mammogram for malignant neoplasm of breast: Secondary | ICD-10-CM | POA: Diagnosis not present

## 2016-06-03 DIAGNOSIS — Z131 Encounter for screening for diabetes mellitus: Secondary | ICD-10-CM | POA: Diagnosis not present

## 2016-06-03 DIAGNOSIS — Z780 Asymptomatic menopausal state: Secondary | ICD-10-CM | POA: Diagnosis not present

## 2016-06-03 DIAGNOSIS — Z13220 Encounter for screening for lipoid disorders: Secondary | ICD-10-CM | POA: Diagnosis not present

## 2016-07-30 DIAGNOSIS — E559 Vitamin D deficiency, unspecified: Secondary | ICD-10-CM | POA: Diagnosis not present

## 2016-08-19 DIAGNOSIS — M79641 Pain in right hand: Secondary | ICD-10-CM | POA: Diagnosis not present

## 2016-08-19 DIAGNOSIS — G5603 Carpal tunnel syndrome, bilateral upper limbs: Secondary | ICD-10-CM | POA: Diagnosis not present

## 2016-08-19 DIAGNOSIS — M79642 Pain in left hand: Secondary | ICD-10-CM | POA: Diagnosis not present

## 2016-09-03 DIAGNOSIS — G5603 Carpal tunnel syndrome, bilateral upper limbs: Secondary | ICD-10-CM | POA: Diagnosis not present

## 2016-09-11 DIAGNOSIS — G5603 Carpal tunnel syndrome, bilateral upper limbs: Secondary | ICD-10-CM | POA: Diagnosis not present

## 2016-09-12 DIAGNOSIS — N952 Postmenopausal atrophic vaginitis: Secondary | ICD-10-CM | POA: Diagnosis not present

## 2016-10-04 DIAGNOSIS — G5603 Carpal tunnel syndrome, bilateral upper limbs: Secondary | ICD-10-CM | POA: Diagnosis not present

## 2016-11-25 ENCOUNTER — Ambulatory Visit (INDEPENDENT_AMBULATORY_CARE_PROVIDER_SITE_OTHER): Payer: 59 | Admitting: Family Medicine

## 2016-11-25 ENCOUNTER — Encounter: Payer: Self-pay | Admitting: Family Medicine

## 2016-11-25 DIAGNOSIS — M722 Plantar fascial fibromatosis: Secondary | ICD-10-CM | POA: Diagnosis not present

## 2016-11-25 NOTE — Progress Notes (Signed)
Procedure Note   Patient was fitted for a : Heel Spur Light standard, cushioned, semi-rigid orthotic. The orthotic was heated and afterward the patient patient seated position and molded The patient was positioned in subtalar neutral position and 10 degrees of ankle dorsiflexion in a weight bearing stance. After completion of molding, patient did have orthotic management The blank was ground to a stable position for weight bearing. Size: women's 7.5 Base: Carbon fiber Additional Posting and Padding:  Medial longitudinal arch: 250/70 bilaterally The patient ambulated these, and they were very comfortable.

## 2016-11-25 NOTE — Assessment & Plan Note (Signed)
Custom orthotics made today. Patient will slowly start to increase wear over the course of next several weeks. We discussed that adjustments may be necessary. Patient will follow-up again in 4 weeks

## 2016-12-18 DIAGNOSIS — G5603 Carpal tunnel syndrome, bilateral upper limbs: Secondary | ICD-10-CM | POA: Diagnosis not present

## 2016-12-19 ENCOUNTER — Encounter: Payer: Self-pay | Admitting: Family Medicine

## 2016-12-19 ENCOUNTER — Ambulatory Visit (INDEPENDENT_AMBULATORY_CARE_PROVIDER_SITE_OTHER): Payer: 59 | Admitting: Family Medicine

## 2016-12-19 DIAGNOSIS — M222X2 Patellofemoral disorders, left knee: Secondary | ICD-10-CM

## 2016-12-19 DIAGNOSIS — M222X1 Patellofemoral disorders, right knee: Secondary | ICD-10-CM | POA: Diagnosis not present

## 2016-12-19 HISTORY — DX: Patellofemoral disorders, right knee: M22.2X1

## 2016-12-19 MED ORDER — DICLOFENAC SODIUM 2 % TD SOLN: 2.0000 "application " | Freq: Two times a day (BID) | TRANSDERMAL | 3 refills | Status: DC

## 2016-12-19 MED ORDER — VITAMIN D (ERGOCALCIFEROL) 1.25 MG (50000 UNIT) PO CAPS: 50000.0000 [IU] | ORAL_CAPSULE | ORAL | 0 refills | Status: DC

## 2016-12-19 NOTE — Patient Instructions (Addendum)
Good to see you  Ice 20 minutes 2 times daily. Usually after activity and before bed. pennsaid pinkie amount topically 2 times daily as needed.  Exercises 3 times a week  Once weekly vitamin D for 12 weeks.  Continue the orthotics.  Over the counter  Turmeric 500mg  twice daily  Tart cherry extract any dose.  Black Cohash 200mg  nightly.  See me again in 4 weeks and if not better we will consider injections and labs

## 2016-12-19 NOTE — Progress Notes (Signed)
[name] [address], [place] [address] Phone: [phone] Subjective:    I'm seeing this patient by the request  of:    CC: bilateral knee pain   [id number]  TEKIA WATERBURY is a 56 y.o. female coming in with complaint of bilateral knee pain. Had this for quite some time. Significant worse after squatting. 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 Patient describes pain as a dull, throbbing aching sensation. Patient denies any swelling. States though that sometimes it feels like her legs and give out. States going up and down stairs causes more soreness in her quadriceps bilaterally. Never had true injury. Rates the severity of pain is 10 out of 10 when it occurs. Does respond to anti-inflammatories      Past Medical History:  Diagnosis Date  . Complex cyst of left ovary   . Endometrial polyp   . Heart murmur Dr Johnsie Cancel   dx 2012--  Benign--  per echo abnormal mitral inflow  . History of gastrointestinal ulcer   . IBS (irritable bowel syndrome)   . Seasonal allergies   . Wears contact lenses    Past Surgical History:  Procedure Laterality Date  . COLONOSCOPY WITH PROPOFOL  last one 04-28-2014  . CYSTOSCOPY N/A 06/06/2014   Procedure: CYSTOSCOPY;  Surgeon: [name], MD;  Location: [facility];  Service: Gynecology;  Laterality: N/A;  . DILATATION & CURRETTAGE/HYSTEROSCOPY WITH RESECTOCOPE N/A 06/06/2014   Procedure: DILATATION & CURETTAGE/HYSTEROSCOPY WITH RESECTOCOPE;  Surgeon: [name], MD;  Location: [place];  Service: Gynecology;  Laterality: N/A;  . LAPAROSCOPIC SALPINGO OOPHERECTOMY Left 06/06/2014   Procedure: LAPAROSCOPIC LEFT SALPINGO OOPHORECTOMY;  Surgeon: [name], MD;  Location: [place];  Service: Gynecology;  Laterality: Left;  . PLANTAR FASCIA SURGERY Left 2009   topaz procedure  . TRANSTHORACIC ECHOCARDIOGRAM  08-12-2007   normal LV,  ef 65-70%,   abnormal mitral inflow with trivial MR,  trivial PR and TR   Social History   Social History  . Marital status: Married    Spouse name: N/A  . Number of children: 2  . Years of education: N/A   Occupational History  . preschool teacher Homemaker   Social History Main Topics  . Smoking status: Never Smoker  . Smokeless tobacco: Never Used  . Alcohol use No  . Drug use: No  . Sexual activity: Not Asked   Other Topics Concern  . None   Social History Narrative   Married, Aeronautical engineer 2 daughters   1-2 caffeinated beverages/day   No Known Allergies Family History  Problem Relation Age of Onset  . Glaucoma Mother   . Hypertension Mother   . Hyperlipidemia Mother   . Heart disease Paternal Grandfather   . Tuberculosis Maternal Grandfather   . Diabetes Maternal Grandfather   . Dementia Paternal Grandmother   . Tuberculosis Unknown   . Colon cancer Neg Hx   . Stomach cancer Neg Hx      Past medical history, social, surgical and family history all reviewed in electronic medical record.  No pertanent information unless stated regarding to the chief complaint.   Review of Systems:Review of systems updated and as accurate as of 12/19/16  No headache, visual changes, nausea, vomiting, diarrhea, constipation, dizziness, abdominal pain, skin rash, fevers, chills, night sweats, weight loss, swollen lymph nodes, body aches, joint swelling, chest pain, shortness of breath, mood changes. Positive muscle aches  Objective  Blood pressure 122/82, pulse 71, height 5' 1.75" (1.568 m), SpO2 98 %. Systems examined below as of 12/19/16   General: No apparent distress alert and oriented x3 mood and affect normal, dressed appropriately.  HEENT: Pupils equal, extraocular movements intact  Respiratory: Patient's speak in full sentences and does not appear short of breath  Cardiovascular: No lower extremity edema, non tender, no erythema  Skin: Warm dry intact with no signs of infection  or rash on extremities or on axial skeleton.  Abdomen: Soft nontender  Neuro: Cranial nerves II through XII are intact, neurovascularly intact in all extremities with 2+ DTRs and 2+ pulses.  Lymph: No lymphadenopathy of posterior or anterior cervical chain or axillae bilaterally.  Gait normal with good balance and coordination.  MSK:  Non tender with full range of motion and good stability and symmetric strength and tone of shoulders, elbows, wrist, hip, and ankles bilaterally.  Knee: Bilateral  Normal to inspection with no erythema or effusion or obvious bony abnormalities. Mild lateral tilt of the patella Palpation normal with no warmth, joint line tenderness, patellar tenderness, or condyle tenderness. ROM full in flexion and extension and lower leg rotation. Ligaments with solid consistent endpoints including ACL, PCL, LCL, MCL. Negative Mcmurray's, Apley's, and Thessalonian tests. painful patellar compression. Patellar glide with moderate crepitus. Patellar and quadriceps tendons unremarkable. Hamstring and quadriceps strength is normal.   Procedure  [id number]; 15 additional minutes spent for Therapeutic exercises as stated in above notes.  This included exercises focusing on stretching, strengthening, with significant focus on eccentric aspects.   Long term goals include an improvement in range of motion, strength, endurance as well as avoiding reinjury. Patient's frequency would include in 1-2 times a day, 3-5 times a week for a duration of 6-12 weeks. Patellofemoral Syndrome  Reviewed anatomy using anatomical model and how PFS occurs.  Given rehab exercises handout for VMO, hip abductors, core, entire kinetic chain including proprioception exercises including cone touches, step downs, hip elevations and turn outs.  Could benefit from PT, regular exercise, upright biking, and a PFS knee brace to assist with tracking abnormalities.   Proper technique shown and discussed handout in great  detail with ATC.  All questions were discussed and answered.      Impression and Recommendations:     This case required medical decision making of moderate complexity.      Note: This dictation was prepared with Dragon dictation along with smaller phrase technology. Any transcriptional errors that result from this process are unintentional.

## 2016-12-19 NOTE — Assessment & Plan Note (Signed)
Patellofemoral syndrome. Discussed icing regimen, home exercises, which activities doing which was to avoid. Increase activity slowly. We discussed the possibility of bracing. Topical anti-inflammatory's prescribed. Once weekly vitamin D for muscle strength. At follow-up in 4 weeks if not better consider injection, formal physical therapy, bracing and possible labs for muscle soreness

## 2017-01-10 DIAGNOSIS — G5601 Carpal tunnel syndrome, right upper limb: Secondary | ICD-10-CM | POA: Diagnosis not present

## 2017-01-16 ENCOUNTER — Ambulatory Visit: Payer: 59 | Admitting: Family Medicine

## 2017-01-28 NOTE — Progress Notes (Deleted)
[name] [address], [place] [address] Phone: [phone] Subjective:   CC: Patellofemoral syndrome follow-up  [id number]  Elizabeth Meyer is a 56 y.o. female coming in with complaint of bilateral patellofemoral syndrome. Patient was found to have this and let us exam. Started home exercises, icing regimen and topical anti-inflammatories. Patient states  Patient did have bilateral carpal tunnel syndrome didn't have surgery on 01/10/2017.      Past Medical History:  Diagnosis Date  . Complex cyst of left ovary   . Endometrial polyp   . Heart murmur Dr Johnsie Cancel   dx 2012--  Benign--  per echo abnormal mitral inflow  . History of gastrointestinal ulcer   . IBS (irritable bowel syndrome)   . Seasonal allergies   . Wears contact lenses    Past Surgical History:  Procedure Laterality Date  . COLONOSCOPY WITH PROPOFOL  last one 04-28-2014  . CYSTOSCOPY N/A 06/06/2014   Procedure: CYSTOSCOPY;  Surgeon: [name], MD;  Location: [facility];  Service: Gynecology;  Laterality: N/A;  . DILATATION & CURRETTAGE/HYSTEROSCOPY WITH RESECTOCOPE N/A 06/06/2014   Procedure: DILATATION & CURETTAGE/HYSTEROSCOPY WITH RESECTOCOPE;  Surgeon: [name], MD;  Location: [facility];  Service: Gynecology;  Laterality: N/A;  . LAPAROSCOPIC SALPINGO OOPHERECTOMY Left 06/06/2014   Procedure: LAPAROSCOPIC LEFT SALPINGO OOPHORECTOMY;  Surgeon: [name], MD;  Location: [facility];  Service: Gynecology;  Laterality: Left;  . PLANTAR FASCIA SURGERY Left 2009   topaz procedure  . TRANSTHORACIC ECHOCARDIOGRAM  08-12-2007   normal LV,  ef 65-70%,  abnormal mitral inflow with trivial MR,  trivial PR and TR   Social History   Social History  . Marital status: Married    Spouse name: N/A  . Number of children: 2  . Years of education: N/A   Occupational History  . preschool teacher Homemaker   Social  History Main Topics  . Smoking status: Never Smoker  . Smokeless tobacco: Never Used  . Alcohol use No  . Drug use: No  . Sexual activity: Not on file   Other Topics Concern  . Not on file   Social History Narrative   Married, Aeronautical engineer 2 daughters   1-2 caffeinated beverages/day   No Known Allergies Family History  Problem Relation Age of Onset  . Glaucoma Mother   . Hypertension Mother   . Hyperlipidemia Mother   . Heart disease Paternal Grandfather   . Tuberculosis Maternal Grandfather   . Diabetes Maternal Grandfather   . Dementia Paternal Grandmother   . Tuberculosis Unknown   . Colon cancer Neg Hx   . Stomach cancer Neg Hx      Past medical history, social, surgical and family history all reviewed in electronic medical record.  No pertanent information unless stated regarding to the chief complaint.   Review of Systems:Review of systems updated and as accurate as of 01/28/17  No headache, visual changes, nausea, vomiting, diarrhea, constipation, dizziness, abdominal pain, skin rash, fevers, chills, night sweats, weight loss, swollen lymph nodes, body aches, joint swelling, muscle aches, chest pain, shortness of breath, mood changes.   Objective  There were no vitals taken for this visit. Systems examined below as of 01/28/17   General: No apparent distress alert and oriented x3 mood and affect normal, dressed appropriately.  HEENT: Pupils equal, extraocular movements intact  Respiratory: Patient's speak in full sentences and does not appear short of breath  Cardiovascular: No lower extremity edema, non tender, no erythema  Skin: Warm dry intact with no signs of infection or rash on extremities or on axial skeleton.  Abdomen: Soft nontender  Neuro: Cranial nerves II through XII are intact, neurovascularly intact in all extremities with 2+ DTRs and 2+ pulses.  Lymph: No lymphadenopathy of posterior or anterior cervical chain or axillae bilaterally.  Gait  normal with good balance and coordination.  MSK:  Non tender with full range of motion and good stability and symmetric strength and tone of shoulders, elbows, wrist, hip and ankles bilaterally.  Knee: Normal to inspection with no erythema or effusion or obvious bony abnormalities. Palpation normal with no warmth, joint line tenderness, patellar tenderness, or condyle tenderness. ROM full in flexion and extension and lower leg rotation. Ligaments with solid consistent endpoints including ACL, PCL, LCL, MCL. Negative Mcmurray's, Apley's, and Thessalonian tests. Non painful patellar compression. Patellar glide without crepitus. Patellar and quadriceps tendons unremarkable. Hamstring and quadriceps strength is normal.     Impression and Recommendations:     This case required medical decision making of moderate complexity.      Note: This dictation was prepared with Dragon dictation along with smaller phrase technology. Any transcriptional errors that result from this process are unintentional.

## 2017-01-29 ENCOUNTER — Ambulatory Visit: Payer: Self-pay | Admitting: Family Medicine

## 2017-02-04 ENCOUNTER — Ambulatory Visit (INDEPENDENT_AMBULATORY_CARE_PROVIDER_SITE_OTHER): Payer: 59 | Admitting: Family Medicine

## 2017-02-04 ENCOUNTER — Encounter: Payer: Self-pay | Admitting: Family Medicine

## 2017-02-04 DIAGNOSIS — M222X1 Patellofemoral disorders, right knee: Secondary | ICD-10-CM | POA: Diagnosis not present

## 2017-02-04 DIAGNOSIS — M222X2 Patellofemoral disorders, left knee: Secondary | ICD-10-CM

## 2017-02-04 MED ORDER — TRAZODONE HCL 50 MG PO TABS: 25.0000 mg | ORAL_TABLET | Freq: Every evening | ORAL | 3 refills | Status: DC | PRN

## 2017-02-04 NOTE — Assessment & Plan Note (Signed)
No improvement- started injections declined PT, HEP and icing.  Pennsaid discussed OTC meds Will return in 4 weeks.

## 2017-02-04 NOTE — Progress Notes (Signed)
[name] [address], [place] [address] Phone: [phone] Subjective:    I'm seeing this patient by the request  of:    CC: Bilateral knee pain  [id number]  Elizabeth Meyer is a 56 y.o. female coming in with complaint of bilateral knee pain. Found to have patellofemoral syndrome. No improvement with conservative therapy including home exercises. Starting to affect daily activities. Difficult to go up and down stairs. Feels sometimes unstable.     Past Medical History:  Diagnosis Date  . Complex cyst of left ovary   . Endometrial polyp   . Heart murmur Dr Johnsie Cancel   dx 2012--  Benign--  per echo abnormal mitral inflow  . History of gastrointestinal ulcer   . IBS (irritable bowel syndrome)   . Seasonal allergies   . Wears contact lenses    Past Surgical History:  Procedure Laterality Date  . COLONOSCOPY WITH PROPOFOL  last one 04-28-2014  . CYSTOSCOPY N/A 06/06/2014   Procedure: CYSTOSCOPY;  Surgeon: [name], MD;  Location: [facility];  Service: Gynecology;  Laterality: N/A;  . DILATATION & CURRETTAGE/HYSTEROSCOPY WITH RESECTOCOPE N/A 06/06/2014   Procedure: DILATATION & CURETTAGE/HYSTEROSCOPY WITH RESECTOCOPE;  Surgeon: [name], MD;  Location: [facility];  Service: Gynecology;  Laterality: N/A;  . LAPAROSCOPIC SALPINGO OOPHERECTOMY Left 06/06/2014   Procedure: LAPAROSCOPIC LEFT SALPINGO OOPHORECTOMY;  Surgeon: [name], MD;  Location: [facility];  Service: Gynecology;  Laterality: Left;  . PLANTAR FASCIA SURGERY Left 2009   topaz procedure  . TRANSTHORACIC ECHOCARDIOGRAM  08-12-2007   normal LV,  ef 65-70%,  abnormal mitral inflow with trivial MR,  trivial PR and TR   Social History   Social History  . Marital status: Married    Spouse name: N/A  . Number of children: 2  . Years of education: N/A   Occupational History  . preschool teacher Homemaker   Social  History Main Topics  . Smoking status: Never Smoker  . Smokeless tobacco: Never Used  . Alcohol use No  . Drug use: No  . Sexual activity: Not Asked   Other Topics Concern  . None   Social History Narrative   Married, Aeronautical engineer 2 daughters   1-2 caffeinated beverages/day   No Known Allergies Family History  Problem Relation Age of Onset  . Glaucoma Mother   . Hypertension Mother   . Hyperlipidemia Mother   . Heart disease Paternal Grandfather   . Tuberculosis Maternal Grandfather   . Diabetes Maternal Grandfather   . Dementia Paternal Grandmother   . Tuberculosis Unknown   . Colon cancer Neg Hx   . Stomach cancer Neg Hx      Past medical history, social, surgical and family history all reviewed in electronic medical record.  No pertanent information unless stated regarding to the chief complaint.   Review of Systems:Review of systems updated and as accurate as of 02/04/17  No headache, visual changes, nausea, vomiting, diarrhea, constipation, dizziness, abdominal pain, skin rash, fevers, chills, night sweats, weight loss, swollen lymph nodes, body aches, joint swelling, chest pain, shortness of breath, mood changes. Positive muscle aches  Objective  Blood pressure 124/82, pulse 87, height 5\' 2"  (1.575 m), weight 147 lb (66.7 kg), SpO2 93 %. Systems examined below as of 02/04/17   General: No apparent distress alert and oriented x3 mood and affect normal, dressed appropriately.  HEENT: Pupils equal, extraocular movements intact  Respiratory: Patient's speak in full sentences and does not appear short of breath  Cardiovascular: No lower extremity edema, non tender, no erythema  Skin: Warm dry intact with no signs of infection or rash on extremities or on axial skeleton.  Abdomen: Soft nontender  Neuro: Cranial nerves II through XII are intact, neurovascularly intact in all extremities with 2+ DTRs and 2+ pulses.  Lymph: No lymphadenopathy of posterior or anterior  cervical chain or axillae bilaterally.  Gait normal with good balance and coordination.  MSK:  Non tender with full range of motion and good stability and symmetric strength and tone of shoulders, elbows, wrist, hip, knee and ankles bilaterally.  Knee: Bilateral pain over the PF joint +crepitus. No swelling.  Mild lateral tracking noted.  After informed written and verbal consent, patient was seated on exam table. Right knee was prepped with alcohol swab and utilizing anterolateral approach, patient's right knee space was injected with 4:1  marcaine 0.5%: Kenalog 40mg /dL. Patient tolerated the procedure well without immediate complications.  After informed written and verbal consent, patient was seated on exam table. Left knee was prepped with alcohol swab and utilizing anterolateral approach, patient's left knee space was injected with 4:1  marcaine 0.5%: Kenalog 40mg /dL. Patient tolerated the procedure well without immediate complications.     Impression and Recommendations:     This case required medical decision making of moderate complexity.      Note: This dictation was prepared with Dragon dictation along with smaller phrase technology. Any transcriptional errors that result from this process are unintentional.

## 2017-02-04 NOTE — Patient Instructions (Signed)
Good to see you  Ice 20 minutes 2 times daily. Usually after activity and before bed. Exercises 3 times a week.  pennsaid pinkie amount topically 2 times daily as needed.   We injected the knees today  See me again in 4 weeks.

## 2017-02-19 DIAGNOSIS — R29898 Other symptoms and signs involving the musculoskeletal system: Secondary | ICD-10-CM | POA: Diagnosis not present

## 2017-02-19 DIAGNOSIS — M79641 Pain in right hand: Secondary | ICD-10-CM | POA: Diagnosis not present

## 2017-02-19 DIAGNOSIS — G5601 Carpal tunnel syndrome, right upper limb: Secondary | ICD-10-CM | POA: Diagnosis not present

## 2017-03-18 DIAGNOSIS — D2262 Melanocytic nevi of left upper limb, including shoulder: Secondary | ICD-10-CM | POA: Diagnosis not present

## 2017-03-18 DIAGNOSIS — D1801 Hemangioma of skin and subcutaneous tissue: Secondary | ICD-10-CM | POA: Diagnosis not present

## 2017-03-18 DIAGNOSIS — D2371 Other benign neoplasm of skin of right lower limb, including hip: Secondary | ICD-10-CM | POA: Diagnosis not present

## 2017-03-18 DIAGNOSIS — D485 Neoplasm of uncertain behavior of skin: Secondary | ICD-10-CM | POA: Diagnosis not present

## 2017-03-18 DIAGNOSIS — D2261 Melanocytic nevi of right upper limb, including shoulder: Secondary | ICD-10-CM | POA: Diagnosis not present

## 2017-03-19 ENCOUNTER — Other Ambulatory Visit: Payer: Self-pay | Admitting: Family Medicine

## 2017-04-04 DIAGNOSIS — G5603 Carpal tunnel syndrome, bilateral upper limbs: Secondary | ICD-10-CM | POA: Diagnosis not present

## 2017-04-07 ENCOUNTER — Other Ambulatory Visit: Payer: Self-pay | Admitting: Orthopedic Surgery

## 2017-04-07 ENCOUNTER — Telehealth: Payer: Self-pay | Admitting: *Deleted

## 2017-04-07 NOTE — Telephone Encounter (Signed)
Received Dermatopathology Report results from [facility]; forwarded to provider/SLS 12/31

## 2017-04-14 ENCOUNTER — Other Ambulatory Visit: Payer: Self-pay | Admitting: *Deleted

## 2017-04-14 MED ORDER — TRAZODONE HCL 50 MG PO TABS: 25.0000 mg | ORAL_TABLET | Freq: Every evening | ORAL | 0 refills | Status: DC | PRN

## 2017-04-14 NOTE — Telephone Encounter (Signed)
Refill done.  

## 2017-05-08 ENCOUNTER — Encounter (HOSPITAL_BASED_OUTPATIENT_CLINIC_OR_DEPARTMENT_OTHER): Payer: Self-pay | Admitting: *Deleted

## 2017-05-15 ENCOUNTER — Encounter (HOSPITAL_BASED_OUTPATIENT_CLINIC_OR_DEPARTMENT_OTHER)
Admission: RE | Disposition: A | Discharge status: Home / Self Care | Payer: Self-pay | Source: Ambulatory Visit | Attending: Orthopedic Surgery

## 2017-05-15 ENCOUNTER — Ambulatory Visit (HOSPITAL_BASED_OUTPATIENT_CLINIC_OR_DEPARTMENT_OTHER): Payer: 59 | Admitting: Anesthesiology

## 2017-05-15 ENCOUNTER — Encounter (HOSPITAL_BASED_OUTPATIENT_CLINIC_OR_DEPARTMENT_OTHER): Payer: Self-pay

## 2017-05-15 ENCOUNTER — Other Ambulatory Visit: Payer: Self-pay

## 2017-05-15 ENCOUNTER — Ambulatory Visit (HOSPITAL_BASED_OUTPATIENT_CLINIC_OR_DEPARTMENT_OTHER)
Admission: RE | Admit: 2017-05-15 | Discharge: 2017-05-15 | Disposition: A | Discharge status: Home / Self Care | Payer: 59 | Source: Ambulatory Visit | Attending: Orthopedic Surgery | Admitting: Orthopedic Surgery

## 2017-05-15 DIAGNOSIS — G5602 Carpal tunnel syndrome, left upper limb: Secondary | ICD-10-CM | POA: Diagnosis not present

## 2017-05-15 DIAGNOSIS — Z79899 Other long term (current) drug therapy: Secondary | ICD-10-CM | POA: Insufficient documentation

## 2017-05-15 DIAGNOSIS — Z8719 Personal history of other diseases of the digestive system: Secondary | ICD-10-CM | POA: Diagnosis not present

## 2017-05-15 DIAGNOSIS — N840 Polyp of corpus uteri: Secondary | ICD-10-CM | POA: Diagnosis not present

## 2017-05-15 DIAGNOSIS — K589 Irritable bowel syndrome without diarrhea: Secondary | ICD-10-CM | POA: Diagnosis not present

## 2017-05-15 DIAGNOSIS — R010 Benign and innocent cardiac murmurs: Secondary | ICD-10-CM | POA: Insufficient documentation

## 2017-05-15 HISTORY — PX: CARPAL TUNNEL RELEASE: SHX101

## 2017-05-15 SURGERY — CARPAL TUNNEL RELEASE
Anesthesia: Regional | Site: Wrist | Laterality: Left

## 2017-05-15 MED ORDER — HYDROCODONE-ACETAMINOPHEN 5-325 MG PO TABS: 1.0000 | ORAL_TABLET | Freq: Four times a day (QID) | ORAL | 0 refills | Status: DC | PRN

## 2017-05-15 MED ORDER — LIDOCAINE 2% (20 MG/ML) 5 ML SYRINGE
INTRAMUSCULAR | Status: AC
  Filled 2017-05-15: qty 5

## 2017-05-15 MED ORDER — MIDAZOLAM HCL 2 MG/2ML IJ SOLN
INTRAMUSCULAR | Status: AC
  Filled 2017-05-15: qty 2

## 2017-05-15 MED ORDER — CEFAZOLIN SODIUM-DEXTROSE 2-4 GM/100ML-% IV SOLN
2.0000 g | INTRAVENOUS | Status: AC
  Administered 2017-05-15: 2 g via INTRAVENOUS

## 2017-05-15 MED ORDER — BUPIVACAINE HCL (PF) 0.25 % IJ SOLN
INTRAMUSCULAR | Status: AC
  Filled 2017-05-15: qty 30

## 2017-05-15 MED ORDER — FENTANYL CITRATE (PF) 100 MCG/2ML IJ SOLN
50.0000 ug | INTRAMUSCULAR | Status: DC | PRN
  Administered 2017-05-15: 50 ug via INTRAVENOUS

## 2017-05-15 MED ORDER — CEFAZOLIN SODIUM-DEXTROSE 2-4 GM/100ML-% IV SOLN
INTRAVENOUS | Status: AC
  Filled 2017-05-15: qty 100

## 2017-05-15 MED ORDER — SCOPOLAMINE 1 MG/3DAYS TD PT72: 1.0000 | MEDICATED_PATCH | Freq: Once | TRANSDERMAL | Status: DC | PRN

## 2017-05-15 MED ORDER — BUPIVACAINE HCL (PF) 0.25 % IJ SOLN
INTRAMUSCULAR | Status: DC | PRN
  Administered 2017-05-15: 9 mL

## 2017-05-15 MED ORDER — MIDAZOLAM HCL 5 MG/5ML IJ SOLN
INTRAMUSCULAR | Status: DC | PRN
  Administered 2017-05-15 (×2): 1 mg via INTRAVENOUS

## 2017-05-15 MED ORDER — FENTANYL CITRATE (PF) 100 MCG/2ML IJ SOLN
INTRAMUSCULAR | Status: DC | PRN
  Administered 2017-05-15: 50 ug via INTRAVENOUS

## 2017-05-15 MED ORDER — FENTANYL CITRATE (PF) 100 MCG/2ML IJ SOLN
INTRAMUSCULAR | Status: AC
Start: 2017-05-15 — End: ?
  Filled 2017-05-15: qty 2

## 2017-05-15 MED ORDER — LACTATED RINGERS IV SOLN
INTRAVENOUS | Status: DC
  Administered 2017-05-15: 11:00:00 via INTRAVENOUS

## 2017-05-15 MED ORDER — FENTANYL CITRATE (PF) 100 MCG/2ML IJ SOLN
INTRAMUSCULAR | Status: AC
  Filled 2017-05-15: qty 2

## 2017-05-15 MED ORDER — CHLORHEXIDINE GLUCONATE 4 % EX LIQD: 60.0000 mL | Freq: Once | CUTANEOUS | Status: DC

## 2017-05-15 MED ORDER — MIDAZOLAM HCL 2 MG/2ML IJ SOLN
1.0000 mg | INTRAMUSCULAR | Status: DC | PRN
  Administered 2017-05-15: 1 mg via INTRAVENOUS

## 2017-05-15 MED ORDER — PROPOFOL 500 MG/50ML IV EMUL
INTRAVENOUS | Status: DC | PRN
  Administered 2017-05-15: 75 ug/kg/min via INTRAVENOUS

## 2017-05-15 SURGICAL SUPPLY — 36 items
BLADE SURG 15 STRL LF DISP TIS (BLADE) ×1 IMPLANT
BLADE SURG 15 STRL SS (BLADE) ×3
BNDG CMPR 9X4 STRL LF SNTH (GAUZE/BANDAGES/DRESSINGS) ×1
BNDG COHESIVE 3X5 TAN STRL LF (GAUZE/BANDAGES/DRESSINGS) ×3 IMPLANT
BNDG ESMARK 4X9 LF (GAUZE/BANDAGES/DRESSINGS) ×2 IMPLANT
BNDG GAUZE ELAST 4 BULKY (GAUZE/BANDAGES/DRESSINGS) ×3 IMPLANT
CHLORAPREP W/TINT 26ML (MISCELLANEOUS) ×3 IMPLANT
CORD BIPOLAR FORCEPS 12FT (ELECTRODE) ×3 IMPLANT
COVER BACK TABLE 60X90IN (DRAPES) ×3 IMPLANT
COVER MAYO STAND STRL (DRAPES) ×3 IMPLANT
CUFF TOURNIQUET SINGLE 18IN (TOURNIQUET CUFF) ×3 IMPLANT
DRAPE EXTREMITY T 121X128X90 (DRAPE) ×3 IMPLANT
DRAPE SURG 17X23 STRL (DRAPES) ×3 IMPLANT
DRSG PAD ABDOMINAL 8X10 ST (GAUZE/BANDAGES/DRESSINGS) ×3 IMPLANT
GAUZE SPONGE 4X4 12PLY STRL (GAUZE/BANDAGES/DRESSINGS) ×3 IMPLANT
GAUZE XEROFORM 1X8 LF (GAUZE/BANDAGES/DRESSINGS) ×3 IMPLANT
GLOVE BIOGEL PI IND STRL 7.0 (GLOVE) IMPLANT
GLOVE BIOGEL PI IND STRL 8.5 (GLOVE) ×1 IMPLANT
GLOVE BIOGEL PI INDICATOR 7.0 (GLOVE) ×4
GLOVE BIOGEL PI INDICATOR 8.5 (GLOVE) ×2
GLOVE ECLIPSE 6.5 STRL STRAW (GLOVE) ×2 IMPLANT
GLOVE SURG ORTHO 8.0 STRL STRW (GLOVE) ×3 IMPLANT
GOWN STRL REUS W/ TWL LRG LVL3 (GOWN DISPOSABLE) ×1 IMPLANT
GOWN STRL REUS W/TWL LRG LVL3 (GOWN DISPOSABLE) ×3
GOWN STRL REUS W/TWL XL LVL3 (GOWN DISPOSABLE) ×3 IMPLANT
NDL PRECISIONGLIDE 27X1.5 (NEEDLE) IMPLANT
NEEDLE PRECISIONGLIDE 27X1.5 (NEEDLE) ×3 IMPLANT
NS IRRIG 1000ML POUR BTL (IV SOLUTION) ×3 IMPLANT
PACK BASIN DAY SURGERY FS (CUSTOM PROCEDURE TRAY) ×3 IMPLANT
STOCKINETTE 4X48 STRL (DRAPES) ×3 IMPLANT
SUT ETHILON 4 0 PS 2 18 (SUTURE) ×3 IMPLANT
SUT VICRYL 4-0 PS2 18IN ABS (SUTURE) IMPLANT
SYR BULB 3OZ (MISCELLANEOUS) ×3 IMPLANT
SYR CONTROL 10ML LL (SYRINGE) ×2 IMPLANT
TOWEL OR 17X24 6PK STRL BLUE (TOWEL DISPOSABLE) ×3 IMPLANT
UNDERPAD 30X30 (UNDERPADS AND DIAPERS) ×1 IMPLANT

## 2017-05-15 NOTE — H&P (Signed)
Elizabeth Meyer is an 57 y.o. female.   Chief Complaint:numbness left handHPI: Elizabeth Meyer is a 57 year old right-hand-dominant female comes in with a complaint of numbness and tingling both hands. This is slightly greater on the right than on the left. She has occasional pain in the wrist area on her right side which she does not have on her left side this is aching in nature with a VAS score of 3/10 intermittently. She states all of her fingers are normal except small finger on her right side. She is waking 7 out of 7 nights. She is wearing splints. She has no history of injury to her hand or to her neck. She has had no other treatment. She states is been going on for at least 10-12 years increasing over the past 6-9 months. She has no new injuries. Takes heavy use makes this worse nothing seems to make it better. She has no history diabetes thyroid problems arthritis gout. Family history is positive diabetes and arthritis negative for thyroid problems and gout.She is now 3 months following surgery. She is doing well with her right carpal tunnel release. She is sleeping at night. She has regained full mobility. She is complaining of her left side. She has positive nerve conductions on her left side. She states that the symptoms have improved on her left side but she would like to proceed to have the left carpal tunnel release. Her nerve conductions are reviewed with her. This reveals bilateral carpal tunnel with a motor delay of 5.52 on the right 5.63 on the left.               Past Medical History:  Diagnosis Date  . Complex cyst of left ovary   . Endometrial polyp   . Heart murmur Dr Johnsie Cancel   dx 2012--  Benign--  per echo abnormal mitral inflow  . History of gastrointestinal ulcer   . IBS (irritable bowel syndrome)   . Seasonal allergies   . Wears contact lenses     Past Surgical History:  Procedure Laterality Date  . COLONOSCOPY WITH PROPOFOL  last one 04-28-2014  . CYSTOSCOPY N/A  06/06/2014   Procedure: CYSTOSCOPY;  Surgeon: [name], MD;  Location: [facility];  Service: Gynecology;  Laterality: N/A;  . DILATATION & CURRETTAGE/HYSTEROSCOPY WITH RESECTOCOPE N/A 06/06/2014   Procedure: DILATATION & CURETTAGE/HYSTEROSCOPY WITH RESECTOCOPE;  Surgeon: [name], MD;  Location: [place];  Service: Gynecology;  Laterality: N/A;  . LAPAROSCOPIC SALPINGO OOPHERECTOMY Left 06/06/2014   Procedure: LAPAROSCOPIC LEFT SALPINGO OOPHORECTOMY;  Surgeon: [name], MD;  Location: [facility];  Service: Gynecology;  Laterality: Left;  . PLANTAR FASCIA SURGERY Left 2009   topaz procedure  . TRANSTHORACIC ECHOCARDIOGRAM  08-12-2007   normal LV,  ef 65-70%,  abnormal mitral inflow with trivial MR,  trivial PR and TR    Family History  Problem Relation Age of Onset  . Glaucoma Mother   . Hypertension Mother   . Hyperlipidemia Mother   . Heart disease Paternal Grandfather   . Tuberculosis Maternal Grandfather   . Diabetes Maternal Grandfather   . Dementia Paternal Grandmother   . Tuberculosis Unknown   . Colon cancer Neg Hx   . Stomach cancer Neg Hx    Social History:  reports that  has never smoked. she has never used smokeless tobacco. She reports that she does not drink alcohol or use drugs.  Allergies: No Known Allergies  No medications  prior to admission.    No results found for this or any previous visit (from the past 48 hour(s)).  No results found.   Pertinent items are noted in HPI.  Height 5\' 2"  (1.575 m), weight 66.7 kg (147 lb), last menstrual period 03/30/2016.  General appearance: alert, cooperative and appears stated age Head: Normocephalic, without obvious abnormality Neck: no JVD Resp: clear to auscultation bilaterally Cardio: regular rate and rhythm, S1, S2 normal, no murmur, click, rub or gallop GI: soft, non-tender; bowel sounds normal; no masses,  no organomegaly Extremities: numbness left  hand Pulses: 2+ and symmetric Skin: Skin color, texture, turgor normal. No rashes or lesions Neurologic: Grossly normal Incision/Wound: na  Assessment/Plan Assessment:  1. Bilateral carpal tunnel syndrome    Plan: She would like to proceed to have a left carpal tunnel release. She did not have a good experience with the forearm block on her right side. She states that although her hand was numb following the surgery. She had increased discomfort. Discussed doing a supraclavicular axillary block on her left side in an effort to try to alleviate that postoperative pain for her. She is aware of risks and complications. She is advised that there is no guarantee to the surgery the possibility of infection recurrence injury to arteries nerves tendons complete relief symptoms and dystrophy. She is scheduled for left carpal tunnel release in outpatient under axillary supraclavicular block.      Cherity Blickenstaff R 05/15/2017, 8:18 AM

## 2017-05-15 NOTE — Anesthesia Postprocedure Evaluation (Signed)
Anesthesia Post Note  Patient: Elizabeth Meyer  Procedure(s) Performed: LEFT CARPAL TUNNEL RELEASE (Left Wrist)     Patient location during evaluation: PACU Anesthesia Type: Regional Level of consciousness: awake and alert Pain management: pain level controlled Vital Signs Assessment: post-procedure vital signs reviewed and stable Respiratory status: spontaneous breathing, nonlabored ventilation, respiratory function stable and patient connected to nasal cannula oxygen Cardiovascular status: stable and blood pressure returned to baseline Postop Assessment: no apparent nausea or vomiting Anesthetic complications: no    Last Vitals:  Vitals:   05/15/17 1300 05/15/17 1330  BP: 121/82 125/87  Pulse: (!) 56 67  Resp: 10 16  Temp:  36.6 C  SpO2: 100% 99%    Last Pain:  Vitals:   05/15/17 1315  TempSrc:   PainSc: 0-No pain                 Barnet Glasgow

## 2017-05-15 NOTE — Anesthesia Procedure Notes (Signed)
Anesthesia Regional Block: Supraclavicular block   Pre-Anesthetic Checklist: ,, timeout performed, Correct Patient, Correct Site, Correct Laterality, Correct Procedure, Correct Position, site marked, Risks and benefits discussed,  Surgical consent,  Pre-op evaluation,  At surgeon's request and post-op pain management  Laterality: Left  Prep: chloraprep       Needles:  Injection technique: Single-shot  Needle Type: Echogenic Stimulator Needle     Needle Length: 5cm  Needle Gauge: 21     Additional Needles:   Procedures:,,,, ultrasound used (permanent image in chart),,,,  Narrative:  Start time: 05/15/2017 11:26 AM End time: 05/15/2017 11:34 AM Injection made incrementally with aspirations every 5 mL.  Performed by: Personally  Anesthesiologist: Barnet Glasgow, MD

## 2017-05-15 NOTE — Brief Op Note (Signed)
05/15/2017  12:23 PM  PATIENT:  Elizabeth Meyer  57 y.o. 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.02  PROCEDURE:  Procedure(s): LEFT CARPAL TUNNEL RELEASE (Left)  SURGEON:  Surgeon(s) and Role:    [name], MD - Primary  PHYSICIAN ASSISTANT:   ASSISTANTS: none   ANESTHESIA:   local, regional and IV sedation  EBL: none BLOOD ADMINISTERED:none  DRAINS: none   LOCAL MEDICATIONS USED:  BUPIVICAINE   SPECIMEN:  No Specimen  DISPOSITION OF SPECIMEN:  N/A  COUNTS:  YES  TOURNIQUET:   Total Tourniquet Time Documented: Upper Arm (Left) - 12 minutes Total: Upper Arm (Left) - 12 minutes   DICTATION: .Other Dictation: Dictation Number [id number]  PLAN OF CARE: Discharge to home after PACU  PATIENT DISPOSITION:  PACU - hemodynamically stable.

## 2017-05-15 NOTE — Anesthesia Preprocedure Evaluation (Addendum)
Anesthesia Evaluation  Patient identified by MRN, date of birth, ID band Patient awake    Reviewed: Allergy & Precautions, NPO status , Patient's Chart, lab work & pertinent test results  Airway Mallampati: II  TM Distance: >3 FB Neck ROM: Full    Dental no notable dental hx.    Pulmonary neg pulmonary ROS,    Pulmonary exam normal        Cardiovascular negative cardio ROS   Rhythm:Regular Rate:Normal     Neuro/Psych negative neurological ROS  negative psych ROS   GI/Hepatic negative GI ROS, Neg liver ROS,   Endo/Other  negative endocrine ROS  Renal/GU negative Renal ROS  negative genitourinary   Musculoskeletal negative musculoskeletal ROS (+)   Abdominal   Peds  Hematology negative hematology ROS (+)   Anesthesia Other Findings   Reproductive/Obstetrics negative OB ROS                            Anesthesia Physical Anesthesia Plan  ASA: II  Anesthesia Plan: Regional   Post-op Pain Management:    Induction:   PONV Risk Score and Plan: Treatment may vary due to age or medical condition, Ondansetron and Dexamethasone  Airway Management Planned: Mask, Natural Airway and Nasal Cannula  Additional Equipment:   Intra-op Plan:   Post-operative Plan:   Informed Consent: I have reviewed the patients History and Physical, chart, labs and discussed the procedure including the risks, benefits and alternatives for the proposed anesthesia with the patient or authorized representative who has indicated his/her understanding and acceptance.   Dental advisory given  Plan Discussed with: CRNA and Anesthesiologist  Anesthesia Plan Comments:         Anesthesia Quick Evaluation

## 2017-05-15 NOTE — Op Note (Signed)
Other Dictation: Dictation Number [id number]

## 2017-05-15 NOTE — Progress Notes (Signed)
Assisted Dr. Houser with left, ultrasound guided, supraclavicular block. Side rails up, monitors on throughout procedure. See vital signs in flow sheet. Tolerated Procedure well. °

## 2017-05-15 NOTE — Discharge Instructions (Addendum)
° °  ° ° ° °Hand Center Instructions °Hand Surgery ° °Wound Care: °Keep your hand elevated above the level of your heart.  Do not allow it to dangle by your side.  Keep the dressing dry and do not remove it unless your doctor advises you to do so.  He will usually change it at the time of your post-op visit.  Moving your fingers is advised to stimulate circulation but will depend on the site of your surgery.  If you have a splint applied, your doctor will advise you regarding movement. ° °Activity: °Do not drive or operate machinery today.  Rest today and then you may return to your normal activity and work as indicated by your physician. ° °Diet:  °Drink liquids today or eat a light diet.  You may resume a regular diet tomorrow.   ° °General expectations: °Pain for two to three days. °Fingers may become slightly swollen. ° °Call your doctor if any of the following occur: °Severe pain not relieved by pain medication. °Elevated temperature. °Dressing soaked with blood. °Inability to move fingers. °White or bluish color to fingers. ° ° °Regional Anesthesia Blocks ° °1. Numbness or the inability to move the "blocked" extremity may last from 3-48 hours after placement. The length of time depends on the medication injected and your individual response to the medication. If the numbness is not going away after 48 hours, call your surgeon. ° °2. The extremity that is blocked will need to be protected until the numbness is gone and the  Strength has returned. Because you cannot feel it, you will need to take extra care to avoid injury. Because it may be weak, you may have difficulty moving it or using it. You may not know what position it is in without looking at it while the block is in effect. ° °3. For blocks in the legs and feet, returning to weight bearing and walking needs to be done carefully. You will need to wait until the numbness is entirely gone and the strength has returned. You should be able to move your leg  and foot normally before you try and bear weight or walk. You will need someone to be with you when you first try to ensure you do not fall and possibly risk injury. ° °4. Bruising and tenderness at the needle site are common side effects and will resolve in a few days. ° °5. Persistent numbness or new problems with movement should be communicated to the surgeon or the [facility] ([phone])/ [facility] ([phone]). ° ° ° °Post Anesthesia Home Care Instructions ° °Activity: °Get plenty of rest for the remainder of the day. A responsible individual must stay with you for 24 hours following the procedure.  °For the next 24 hours, DO NOT: °-Drive a car °-Operate machinery °-Drink alcoholic beverages °-Take any medication unless instructed by your physician °-Make any legal decisions or sign important papers. ° °Meals: °Start with liquid foods such as gelatin or soup. Progress to regular foods as tolerated. Avoid greasy, spicy, heavy foods. If nausea and/or vomiting occur, drink only clear liquids until the nausea and/or vomiting subsides. Call your physician if vomiting continues. ° °Special Instructions/Symptoms: °Your throat may feel dry or sore from the anesthesia or the breathing tube placed in your throat during surgery. If this causes discomfort, gargle with warm salt water. The discomfort should disappear within 24 hours. ° °If you had a scopolamine patch placed behind your ear for   the management of post- operative nausea and/or vomiting: ° °1. The medication in the patch is effective for 72 hours, after which it should be removed.  Wrap patch in a tissue and discard in the trash. Wash hands thoroughly with soap and water. °2. You may remove the patch earlier than 72 hours if you experience unpleasant side effects which may include dry mouth, dizziness or visual disturbances. °3. Avoid touching the patch. Wash your hands with soap and water after contact with the patch. °  ° ° °

## 2017-05-15 NOTE — Transfer of Care (Signed)
Immediate Anesthesia Transfer of Care Note  Patient: Elizabeth Meyer  Procedure(s) Performed: LEFT CARPAL TUNNEL RELEASE (Left Wrist)  Patient Location: PACU  Anesthesia Type:MAC combined with regional for post-op pain  Level of Consciousness: awake, sedated and patient cooperative  Airway & Oxygen Therapy: Patient Spontanous Breathing  Post-op Assessment: Report given to RN and Post -op Vital signs reviewed and stable  Post vital signs: Reviewed and stable  Last Vitals:  Vitals:   05/15/17 1130 05/15/17 1145  BP: (!) 145/86 120/79  Pulse: 68 70  Resp: 14 17  Temp:    SpO2: 100% 97%    Last Pain:  Vitals:   05/15/17 1103  TempSrc: Oral         Complications: No apparent anesthesia complications

## 2017-05-16 ENCOUNTER — Encounter (HOSPITAL_BASED_OUTPATIENT_CLINIC_OR_DEPARTMENT_OTHER): Payer: Self-pay | Admitting: Orthopedic Surgery

## 2017-05-16 NOTE — Op Note (Signed)
NAME:  Elizabeth Meyer, Elizabeth Meyer                   ACCOUNT NO.:  MEDICAL RECORD NO.:  [id number]  LOCATION:                                 FACILITY:  PHYSICIAN:  [name], M.D.            DATE OF BIRTH:  DATE OF PROCEDURE:  05/15/2017 DATE OF DISCHARGE:                              OPERATIVE REPORT   PREOPERATIVE DIAGNOSIS:  Carpal tunnel syndrome, left hand.  POSTOPERATIVE DIAGNOSIS:  Carpal tunnel syndrome, left hand.  OPERATION:  Decompression of left median nerve.  SURGEON:  [name], M.D.  ASSISTANT:  None.  ANESTHESIA:  Supraclavicular block with local infiltration, IV sedation.  PLACE OF SURGERY:  [facility].  HISTORY:  The patient is a 57 year old female with a history of bilateral carpal tunnel syndrome.  She has undergone release on the right side and is admitted now for release to the left.  She did not have a good experience with the IV regional and has requested a more intense block and anesthesia.  She is aware that there is no guarantee to the surgery; the possibility of infection; recurrence of injury to arteries, nerves, tendons; incomplete relief of symptoms; dystrophy.  In the preoperative area, the patient was seen, the extremity marked by both patient and surgeon.  Antibiotic given.  DESCRIPTION OF PROCEDURE:  The patient was brought to the operating room after a supraclavicular block was carried out in the preoperative area under the direction of the Anesthesia Department.  She was placed in a supine position.  Prepped and draped using ChloraPrep.  A 3-minute dry time taken and time-out allowed.  The limb was exsanguinated with an Esmarch bandage.  Tourniquet placed high on the arm was inflated to 250 mmHg.  A longitudinal incision was made in the left palm, carried down through subcutaneous tissue.  Bleeders were electrocauterized with bipolar.  The palmar fascia was split.  The superficial palmar arch was identified.  The flexor tendon of the  ring and little finger was identified.  The flexor retinaculum was then released on its ulnar border.  A right angle and Sewell retractor were placed between skin and forearm fascia.  The deep structures dissected free with blunt dissection.  Blunt scissors was then used to transect the proximal aspect of the palmar fascia.  Distal forearm fascia was then released proximally 2 cm proximal to the wrist crease under direct vision.  The canal was explored.  An area of compression to the nerve was apparent. Motor branch entered into muscle distally.  No further lesions were identified.  The wound was copiously irrigated with saline.  The skin was closed with interrupted 4-0 nylon sutures.  Local infiltration with 0.25% bupivacaine without epinephrine was given.  Approximately 8 mL was used.  A sterile compressive dressing with the fingers free was applied. On deflation of the tourniquet, all fingers immediately pinked.  She was taken to the recovery room for observation in satisfactory condition. She will be discharged to home to return to the [facility] in 1 week, on [place].          ______________________________ [name], M.D.  GK/MEDQ  D:  05/15/2017  T:  05/15/2017  Job:  [id number]

## 2017-05-16 NOTE — Addendum Note (Signed)
Addendum  created 05/16/17 1122 by Tawni Millers, CRNA   Charge Capture section accepted

## 2017-05-30 ENCOUNTER — Encounter: Payer: Self-pay | Admitting: Family Medicine

## 2017-05-30 ENCOUNTER — Ambulatory Visit (INDEPENDENT_AMBULATORY_CARE_PROVIDER_SITE_OTHER): Payer: 59 | Admitting: Family Medicine

## 2017-05-30 VITALS — BP 100/70 | HR 82 | Temp 98.4°F | Resp 16 | Ht 62.0 in | Wt 147.6 lb

## 2017-05-30 DIAGNOSIS — R05 Cough: Secondary | ICD-10-CM | POA: Insufficient documentation

## 2017-05-30 DIAGNOSIS — R102 Pelvic and perineal pain: Secondary | ICD-10-CM | POA: Diagnosis not present

## 2017-05-30 DIAGNOSIS — R059 Cough, unspecified: Secondary | ICD-10-CM

## 2017-05-30 HISTORY — DX: Pelvic and perineal pain: R10.2

## 2017-05-30 HISTORY — DX: Cough, unspecified: R05.9

## 2017-05-30 LAB — POC URINALSYSI DIPSTICK (AUTOMATED)
Bilirubin, UA: NEGATIVE
Blood, UA: NEGATIVE
Glucose, UA: NEGATIVE
KETONES UA: NEGATIVE
Leukocytes, UA: NEGATIVE
Nitrite, UA: NEGATIVE
PROTEIN UA: NEGATIVE
SPEC GRAV UA: 1.015 (ref 1.010–1.025)
Urobilinogen, UA: 0.2 E.U./dL
pH, UA: 6 (ref 5.0–8.0)

## 2017-05-30 MED ORDER — RANITIDINE HCL 150 MG PO TABS
150.0000 mg | ORAL_TABLET | Freq: Two times a day (BID) | ORAL | 2 refills | Status: DC
Start: 2017-05-30 — End: 2017-08-26

## 2017-05-30 NOTE — Assessment & Plan Note (Signed)
UA neg Pain is mild --- no injury and comes and goes rto if worsens or no improvement

## 2017-05-30 NOTE — Patient Instructions (Signed)
Food Choices for Gastroesophageal Reflux Disease, Adult When you have gastroesophageal reflux disease (GERD), the foods you eat and your eating habits are very important. Choosing the right foods can help ease your discomfort. What guidelines do I need to follow?  Choose fruits, vegetables, whole grains, and low-fat dairy products.  Choose low-fat meat, fish, and poultry.  Limit fats such as oils, salad dressings, butter, nuts, and avocado.  Keep a food diary. This helps you identify foods that cause symptoms.  Avoid foods that cause symptoms. These may be different for everyone.  Eat small meals often instead of 3 large meals a day.  Eat your meals slowly, in a place where you are relaxed.  Limit fried foods.  Cook foods using methods other than frying.  Avoid drinking alcohol.  Avoid drinking large amounts of liquids with your meals.  Avoid bending over or lying down until 2-3 hours after eating. What foods are not recommended? These are some foods and drinks that may make your symptoms worse: Vegetables  Tomatoes. Tomato juice. Tomato and spaghetti sauce. Chili peppers. Onion and garlic. Horseradish. Fruits  Oranges, grapefruit, and lemon (fruit and juice). Meats  High-fat meats, fish, and poultry. This includes hot dogs, ribs, ham, sausage, salami, and bacon. Dairy  Whole milk and chocolate milk. Sour cream. Cream. Butter. Ice cream. Cream cheese. Drinks  Coffee and tea. Bubbly (carbonated) drinks or energy drinks. Condiments  Hot sauce. Barbecue sauce. Sweets/Desserts  Chocolate and cocoa. Donuts. Peppermint and spearmint. Fats and Oils  High-fat foods. This includes French fries and potato chips. Other  Vinegar. Strong spices. This includes black pepper, white pepper, red pepper, cayenne, curry powder, cloves, ginger, and chili powder. The items listed above may not be a complete list of foods and drinks to avoid. Contact your dietitian for more information.    This information is not intended to replace advice given to you by your health care provider. Make sure you discuss any questions you have with your health care provider. Document Released: 09/24/2011 Document Revised: 08/31/2015 Document Reviewed: 01/27/2013 Elsevier Interactive Patient Education  2017 Elsevier Inc.  

## 2017-05-30 NOTE — Progress Notes (Signed)
Patient ID: Elizabeth Meyer, female   DOB: 12-16-1960, 57 y.o.   MRN: [id number]    Subjective:  I acted as a Education administrator for Dr. Carollee Herter.  Guerry Bruin, [place]   Patient ID: Elizabeth Meyer, female    DOB: 1960-11-08, 57 y.o.   MRN: [id number]  Chief Complaint  Patient presents with  . Cough    HPI  Patient is in today for cough.  She thinks it may be related to acid reflux.  Her family notices that she coughs after she eats. Pt also c/o low abd pain -- no urinary frequency or dysuia-- she would like her urine checked.  Pain across low back--- mild, comes and goes    Patient Care Team: Carollee Herter, Alferd Apa, DO as PCP - General   Past Medical History:  Diagnosis Date  . Complex cyst of left ovary   . Endometrial polyp   . Heart murmur Dr Johnsie Cancel   dx 2012--  Benign--  per echo abnormal mitral inflow  . History of gastrointestinal ulcer   . IBS (irritable bowel syndrome)   . Seasonal allergies   . Wears contact lenses     Past Surgical History:  Procedure Laterality Date  . CARPAL TUNNEL RELEASE Left 05/15/2017   Procedure: LEFT CARPAL TUNNEL RELEASE;  Surgeon: [name], MD;  Location: [place];  Service: Orthopedics;  Laterality: Left;  . COLONOSCOPY WITH PROPOFOL  last one 04-28-2014  . CYSTOSCOPY N/A 06/06/2014   Procedure: CYSTOSCOPY;  Surgeon: [name], MD;  Location: [facility];  Service: Gynecology;  Laterality: N/A;  . DILATATION & CURRETTAGE/HYSTEROSCOPY WITH RESECTOCOPE N/A 06/06/2014   Procedure: DILATATION & CURETTAGE/HYSTEROSCOPY WITH RESECTOCOPE;  Surgeon: [name], MD;  Location: [place];  Service: Gynecology;  Laterality: N/A;  . LAPAROSCOPIC SALPINGO OOPHERECTOMY Left 06/06/2014   Procedure: LAPAROSCOPIC LEFT SALPINGO OOPHORECTOMY;  Surgeon: [name], MD;  Location: [place];  Service: Gynecology;  Laterality: Left;  . PLANTAR FASCIA SURGERY Left 2009   topaz procedure  .  TRANSTHORACIC ECHOCARDIOGRAM  08-12-2007   normal LV,  ef 65-70%,  abnormal mitral inflow with trivial MR,  trivial PR and TR    Family History  Problem Relation Age of Onset  . Glaucoma Mother   . Hypertension Mother   . Hyperlipidemia Mother   . Heart disease Paternal Grandfather   . Tuberculosis Maternal Grandfather   . Diabetes Maternal Grandfather   . Dementia Paternal Grandmother   . Tuberculosis Unknown   . Colon cancer Neg Hx   . Stomach cancer Neg Hx     Social History   Socioeconomic History  . Marital status: Married    Spouse name: Not on file  . Number of children: 2  . Years of education: Not on file  . Highest education level: Not on file  Social Needs  . Financial resource strain: Not on file  . Food insecurity - worry: Not on file  . Food insecurity - inability: Not on file  . Transportation needs - medical: Not on file  . Transportation needs - non-medical: Not on file  Occupational History  . 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 Smoking status: Never Smoker  . Smokeless tobacco: Never Used  Substance and Sexual Activity  . Alcohol use: No  . Drug use: No  . Sexual activity: Not on file  Other Topics Concern  . Not on file  Social History Narrative   Married, Aeronautical engineer 2 daughters   1-2 caffeinated beverages/day    Outpatient Medications Prior to Visit  Medication Sig Dispense Refill  . cetirizine (ZYRTEC) 10 MG tablet Take 10 mg by mouth as needed for allergies.    . Diclofenac Sodium (PENNSAID) 2 % SOLN Place 2 application onto the skin 2 (two) times daily. 112 g 3  . fexofenadine (ALLEGRA) 180 MG tablet Take 180 mg by mouth daily.    . fluticasone (FLONASE) 50 MCG/ACT nasal spray Place 2 sprays into both nostrils daily. 16 g 0  . Ibuprofen 200 MG CAPS Take by mouth as needed.    . pseudoephedrine (SUDAFED) 120 MG 12 hr tablet Take 120 mg by mouth every 12 (twelve) hours as needed for congestion.      . traZODone (DESYREL) 50 MG tablet Take 0.5-1 tablets (25-50 mg total) by mouth at bedtime as needed for sleep. 90 tablet 0  . Vitamin D, Ergocalciferol, (DRISDOL) 50000 units CAPS capsule TAKE 1 CAPSULE (50,000 UNITS TOTAL) BY MOUTH EVERY 7 (SEVEN) DAYS. 12 capsule 0  . HYDROcodone-acetaminophen (NORCO) 5-325 MG tablet Take 1 tablet by mouth every 6 (six) hours as needed. 20 tablet 0   No facility-administered medications prior to visit.     No Known Allergies  Review of Systems  Constitutional: Negative for fever and malaise/fatigue.  HENT: Negative for congestion.   Eyes: Negative for blurred vision.  Respiratory: Positive for cough. Negative for shortness of breath.   Cardiovascular: Negative for chest pain, palpitations and leg swelling.  Gastrointestinal: Positive for abdominal pain. Negative for vomiting.  Genitourinary: Positive for flank pain. Negative for dysuria, frequency, hematuria and urgency.  Musculoskeletal: Negative for back pain.  Skin: Negative for rash.  Neurological: Negative for loss of consciousness and headaches.       Objective:    Physical Exam  Constitutional: She is oriented to person, place, and time. She appears well-developed and well-nourished.  HENT:  Head: Normocephalic and atraumatic.  Eyes: Conjunctivae and EOM are normal.  Neck: Normal range of motion. Neck supple. No JVD present. Carotid bruit is not present. No thyromegaly present.  Cardiovascular: Normal rate, regular rhythm and normal heart sounds.  No murmur heard. Pulmonary/Chest: Effort normal and breath sounds normal. No respiratory distress. She has no wheezes. She has no rales. She exhibits no tenderness.  Abdominal: Soft. Bowel sounds are normal. She exhibits no distension and no mass. There is no tenderness. There is no rebound and no guarding.  Musculoskeletal: She exhibits no edema.  Neurological: She is alert and oriented to person, place, and time.  Psychiatric: She has a  normal mood and affect.  Nursing note and vitals reviewed.   BP 100/70 (BP Location: Left Arm, Cuff Size: Normal)   Pulse 82   Temp 98.4 F (36.9 C) (Oral)   Resp 16   Ht 5' 2"  (1.575 m)   Wt 147 lb 9.6 oz (67 kg)   LMP 03/30/2016   SpO2 97%   BMI 27.00 kg/m  Wt Readings from Last 3 Encounters:  05/30/17 147 lb 9.6 oz (67 kg)  05/15/17 144 lb (65.3 kg)  02/04/17 147 lb (66.7 kg)   BP Readings from Last 3 Encounters:  05/30/17 100/70  05/15/17 125/87  02/04/17 124/82     Immunization History  Administered Date(s) Administered  . Influenza Split 02/21/2015  . Influenza,inj,Quad PF,6+ Mos 04/19/2015, 02/07/2017    Health Maintenance  Topic Date Due  . Hepatitis C  Screening  06-17-1960  . HIV Screening  01/17/1978  . TETANUS/TDAP  01/17/1982  . MAMMOGRAM  01/17/2013  . PAP SMEAR  01/04/2017  . COLONOSCOPY  04/28/2024  . INFLUENZA VACCINE  Completed    Lab Results  Component Value Date   WBC 7.6 06/02/2014   HGB 12.5 06/02/2014   HCT 37.1 06/02/2014   PLT 283 06/02/2014    No results found for: TSH Lab Results  Component Value Date   WBC 7.6 06/02/2014   HGB 12.5 06/02/2014   HCT 37.1 06/02/2014   MCV 90.9 06/02/2014   PLT 283 06/02/2014   No results found for: NA, K, CHLORIDE, CO2, GLUCOSE, BUN, CREATININE, BILITOT, ALKPHOS, AST, ALT, PROT, ALBUMIN, CALCIUM, ANIONGAP, EGFR, GFR No results found for: CHOL No results found for: HDL No results found for: LDLCALC No results found for: TRIG No results found for: CHOLHDL No results found for: HGBA1C       Assessment & Plan:   Problem List Items Addressed This Visit    None    Visit Diagnoses    Cough    -  Primary   Relevant Medications   ranitidine (ZANTAC) 150 MG tablet   Suprapubic pain       Relevant Orders   POCT Urinalysis Dipstick (Automated) (Completed)      I have discontinued Anderson Malta C. Manzano's HYDROcodone-acetaminophen. I am also having her start on ranitidine. Additionally, I am  having her maintain her Ibuprofen, pseudoephedrine, cetirizine, fexofenadine, fluticasone, Diclofenac Sodium, Vitamin D (Ergocalciferol), and traZODone.  Meds ordered this encounter  Medications  . ranitidine (ZANTAC) 150 MG tablet    Sig: Take 1 tablet (150 mg total) by mouth 2 (two) times daily.    Dispense:  60 tablet    Refill:  2    CMA served as scribe during this visit. History, Physical and Plan performed by medical provider. Documentation and orders reviewed and attested to.  [name], DO

## 2017-05-30 NOTE — Assessment & Plan Note (Signed)
?   gerd---- diet ho given to pt on gerd Take zantac 150 mg qd-bid Consider referral to gi  Pt takes allergy meds in the spring and has not had any allergy symptoms

## 2017-06-05 DIAGNOSIS — Z1231 Encounter for screening mammogram for malignant neoplasm of breast: Secondary | ICD-10-CM | POA: Diagnosis not present

## 2017-06-16 ENCOUNTER — Other Ambulatory Visit: Payer: Self-pay | Admitting: *Deleted

## 2017-06-16 ENCOUNTER — Ambulatory Visit (INDEPENDENT_AMBULATORY_CARE_PROVIDER_SITE_OTHER): Payer: 59 | Admitting: Family Medicine

## 2017-06-16 ENCOUNTER — Other Ambulatory Visit: Payer: Self-pay

## 2017-06-16 ENCOUNTER — Ambulatory Visit (INDEPENDENT_AMBULATORY_CARE_PROVIDER_SITE_OTHER)
Admission: RE | Admit: 2017-06-16 | Discharge: 2017-06-16 | Disposition: A | Discharge status: Home / Self Care | Payer: 59 | Source: Ambulatory Visit | Attending: Family Medicine | Admitting: Family Medicine

## 2017-06-16 VITALS — BP 132/86 | HR 97 | Ht 62.0 in | Wt 149.0 lb

## 2017-06-16 DIAGNOSIS — M25561 Pain in right knee: Secondary | ICD-10-CM

## 2017-06-16 DIAGNOSIS — M25562 Pain in left knee: Secondary | ICD-10-CM | POA: Diagnosis not present

## 2017-06-16 DIAGNOSIS — G8929 Other chronic pain: Secondary | ICD-10-CM

## 2017-06-16 DIAGNOSIS — M222X1 Patellofemoral disorders, right knee: Secondary | ICD-10-CM | POA: Diagnosis not present

## 2017-06-16 DIAGNOSIS — M222X2 Patellofemoral disorders, left knee: Secondary | ICD-10-CM

## 2017-06-16 MED ORDER — DICLOFENAC SODIUM 2 % TD SOLN: 2.0000 "application " | Freq: Two times a day (BID) | TRANSDERMAL | 3 refills | Status: DC

## 2017-06-16 MED ORDER — VITAMIN D (ERGOCALCIFEROL) 1.25 MG (50000 UNIT) PO CAPS: 50000.0000 [IU] | ORAL_CAPSULE | ORAL | 0 refills | Status: DC

## 2017-06-16 NOTE — Assessment & Plan Note (Signed)
Bilateral patellofemoral.  We will go to be sent to formal physical therapy.  We discussed icing regimen and home exercises.  Which activities to doing which wants to avoid topical NSAIDs Worsening symptoms consider injections.  Patellofemoral brace given.

## 2017-06-16 NOTE — Patient Instructions (Signed)
Good to see you  It will take some time but is fixable.  Ice and pennsaid 2 times a day no matter what for 1 week.  PT will be calling you  Biking would be great  See me again I n3 weeks if you need injections otherwise see me again in 6 weeks

## 2017-06-16 NOTE — Progress Notes (Signed)
[facility] [address] [place], [place] [address] Phone: [phone] Subjective:    I'm seeing this patient by the request  of:    CC: Bilateral knee pain follow-up  [id number]  Elizabeth Meyer is a 57 y.o. female coming in with complaint of bilateral knee pain. Her pain has increased as the injections have worn off. Left knee is still worse than the right. She is wondering if she should go to physical therapy to learn what to do and not do. She has used ice to alleviate her pain recently.  Patient has been seen previously and diagnosed with more patellofemoral syndrome.  Last injections were greater than 4 months ago.  Started having increasing discomfort again now.  Feels like she is having swelling.     Past Medical History:  Diagnosis Date  . Complex cyst of left ovary   . Endometrial polyp   . Heart murmur Dr Johnsie Cancel   dx 2012--  Benign--  per echo abnormal mitral inflow  . History of gastrointestinal ulcer   . IBS (irritable bowel syndrome)   . Seasonal allergies   . Wears contact lenses    Past Surgical History:  Procedure Laterality Date  . CARPAL TUNNEL RELEASE Left 05/15/2017   Procedure: LEFT CARPAL TUNNEL RELEASE;  Surgeon: [name], MD;  Location: [facility];  Service: Orthopedics;  Laterality: Left;  . COLONOSCOPY WITH PROPOFOL  last one 04-28-2014  . CYSTOSCOPY N/A 06/06/2014   Procedure: CYSTOSCOPY;  Surgeon: [name], MD;  Location: [facility];  Service: Gynecology;  Laterality: N/A;  . DILATATION & CURRETTAGE/HYSTEROSCOPY WITH RESECTOCOPE N/A 06/06/2014   Procedure: DILATATION & CURETTAGE/HYSTEROSCOPY WITH RESECTOCOPE;  Surgeon: [name], MD;  Location: [facility];  Service: Gynecology;  Laterality: N/A;  . LAPAROSCOPIC SALPINGO OOPHERECTOMY Left 06/06/2014   Procedure: LAPAROSCOPIC LEFT SALPINGO OOPHORECTOMY;  Surgeon: [name], MD;  Location: [facility];  Service: Gynecology;  Laterality: Left;  . PLANTAR FASCIA SURGERY Left 2009   topaz procedure  . TRANSTHORACIC ECHOCARDIOGRAM  08-12-2007   normal LV,  ef 65-70%,  abnormal mitral inflow with trivial MR,  trivial PR and TR   Social History   Socioeconomic History  . Marital status: Married    Spouse name: Not on file  . Number of children: 2  . Years of education: Not on file  . Highest education level: Not on file  Social Needs  . Financial resource strain: Not on file  . Food insecurity - worry: Not on file  . Food insecurity - inability: Not on file  . Transportation needs - medical: Not on file  . Transportation needs - non-medical: Not on file  Occupational History  . 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 Smoking status: Never Smoker  . Smokeless tobacco: Never Used  Substance and Sexual Activity  . Alcohol use: No  . Drug use: No  . Sexual activity: Not on file  Other Topics Concern  . Not on file  Social History Narrative   Married, Aeronautical engineer 2 daughters   1-2 caffeinated beverages/day   No Known Allergies Family History  Problem Relation Age of Onset  . Glaucoma Mother   . Hypertension Mother   . Hyperlipidemia Mother   . Heart disease Paternal Grandfather   . Tuberculosis Maternal Grandfather   . Diabetes Maternal Grandfather   . Dementia Paternal Grandmother   .  Tuberculosis Unknown   . Colon cancer Neg Hx   . Stomach cancer Neg Hx      Past medical history, social, surgical and family history all reviewed in electronic medical record.  No pertanent information unless stated regarding to the chief complaint.   Review of Systems:Review of systems updated and as accurate as of 06/16/17  No headache, visual changes, nausea, vomiting, diarrhea, constipation, dizziness, abdominal pain, skin rash, fevers, chills, night sweats, weight loss, swollen lymph nodes, body aches, joint swelling,chest pain, shortness  of breath, mood changes.  Positive muscle aches  Objective  Blood pressure 132/86, pulse 97, height 5\' 2"  (1.575 m), weight 149 lb (67.6 kg), last menstrual period 03/30/2016, SpO2 98 %. Systems examined below as of 06/16/17   General: No apparent distress alert and oriented x3 mood and affect normal, dressed appropriately.  HEENT: Pupils equal, extraocular movements intact  Respiratory: Patient's speak in full sentences and does not appear short of breath  Cardiovascular: No lower extremity edema, non tender, no erythema  Skin: Warm dry intact with no signs of infection or rash on extremities or on axial skeleton.  Abdomen: Soft nontender  Neuro: Cranial nerves II through XII are intact, neurovascularly intact in all extremities with 2+ DTRs and 2+ pulses.  Lymph: No lymphadenopathy of posterior or anterior cervical chain or axillae bilaterally.  Gait normal with good balance and coordination.  MSK:  Non tender with full range of motion and good stability and symmetric strength and tone of shoulders, elbows, wrist, hip and ankles bilaterally.   Knee: Bilateral Mild lateral tilt of the patella bilaterally Palpation normal with no warmth, joint line tenderness, patellar tenderness, or condyle tenderness. ROM full in flexion and extension and lower leg rotation. Ligaments with solid consistent endpoints including ACL, PCL, LCL, MCL. Negative Mcmurray's, Apley's, and Thessalonian tests. Mild painful patellar compression. Patellar glide with mild to moderate crepitus. Patellar and quadriceps tendons unremarkable. Hamstring and quadriceps strength is normal.     Impression and Recommendations:     This case required medical decision making of moderate complexity.      Note: This dictation was prepared with Dragon dictation along with smaller phrase technology. Any transcriptional errors that result from this process are unintentional.

## 2017-06-16 NOTE — Telephone Encounter (Signed)
Refill done.  

## 2017-06-17 ENCOUNTER — Ambulatory Visit (INDEPENDENT_AMBULATORY_CARE_PROVIDER_SITE_OTHER): Payer: 59 | Admitting: Family Medicine

## 2017-06-17 ENCOUNTER — Encounter: Payer: Self-pay | Admitting: Family Medicine

## 2017-06-17 VITALS — BP 122/78 | HR 103 | Temp 98.0°F | Ht 62.0 in | Wt 149.0 lb

## 2017-06-17 DIAGNOSIS — Z780 Asymptomatic menopausal state: Secondary | ICD-10-CM | POA: Insufficient documentation

## 2017-06-17 DIAGNOSIS — E039 Hypothyroidism, unspecified: Secondary | ICD-10-CM

## 2017-06-17 DIAGNOSIS — E7800 Pure hypercholesterolemia, unspecified: Secondary | ICD-10-CM | POA: Insufficient documentation

## 2017-06-17 DIAGNOSIS — J029 Acute pharyngitis, unspecified: Secondary | ICD-10-CM | POA: Diagnosis not present

## 2017-06-17 HISTORY — DX: Asymptomatic menopausal state: Z78.0

## 2017-06-17 HISTORY — DX: Hypothyroidism, unspecified: E03.9

## 2017-06-17 HISTORY — DX: Pure hypercholesterolemia, unspecified: E78.00

## 2017-06-17 LAB — POCT RAPID STREP A (OFFICE): RAPID STREP A SCREEN: NEGATIVE

## 2017-06-17 MED ORDER — PENICILLIN V POTASSIUM 500 MG PO TABS: 500.0000 mg | ORAL_TABLET | Freq: Three times a day (TID) | ORAL | 0 refills | Status: AC

## 2017-06-17 NOTE — Progress Notes (Signed)
Subjective:  Patient ID: Elizabeth Meyer, female    DOB: 10/04/1960  Age: 57 y.o. MRN: [id number]  CC: Possible Strep Throat (started on Sunday, sorre throat, headache, no fever. )   HPI Elizabeth Meyer presents for 2-day history of headache and sore throat.  She denies stuffy nose drainage and cough.  Appetite has been okay.  There is been no nausea or vomiting.  She has been afebrile.  There have been some myalgias.  She is felt fatigued.  She has tried Sudafed with some relief.  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  Outpatient Medications Prior to Visit  Medication Sig Dispense Refill  . cetirizine (ZYRTEC) 10 MG tablet Take 10 mg by mouth as needed for allergies.    . Diclofenac Sodium (PENNSAID) 2 % SOLN Place 2 application onto the skin 2 (two) times daily. 112 g 3  . fexofenadine (ALLEGRA) 180 MG tablet Take 180 mg by mouth daily.    . fluticasone (FLONASE) 50 MCG/ACT nasal spray Place 2 sprays into both nostrils daily. 16 g 0  . Ibuprofen 200 MG CAPS Take by mouth as needed.    . pseudoephedrine (SUDAFED) 120 MG 12 hr tablet Take 120 mg by mouth every 12 (twelve) hours as needed for congestion.    . ranitidine (ZANTAC) 150 MG tablet Take 1 tablet (150 mg total) by mouth 2 (two) times daily. 60 tablet 2  . traZODone (DESYREL) 50 MG tablet Take 0.5-1 tablets (25-50 mg total) by mouth at bedtime as needed for sleep. 90 tablet 0  . Vitamin D, Ergocalciferol, (DRISDOL) 50000 units CAPS capsule Take 1 capsule (50,000 Units total) by mouth every 7 (seven) days. 12 capsule 0   No facility-administered medications prior to visit.     ROS Review of Systems  Constitutional: Positive for fatigue. Negative for appetite change, chills, fever and unexpected weight change.  HENT: Positive for sore throat. Negative for congestion, ear pain, postnasal drip, rhinorrhea, sinus pressure, sinus pain and trouble swallowing.   Eyes: Negative for photophobia and visual disturbance.  Respiratory:  Negative for cough.   Cardiovascular: Negative.   Gastrointestinal: Negative for abdominal pain, nausea and vomiting.  Musculoskeletal: Positive for myalgias. Negative for arthralgias.  Skin: Negative for rash.  Allergic/Immunologic: Negative for immunocompromised state.  Neurological: Positive for headaches. Negative for weakness.  Hematological: Negative.   Psychiatric/Behavioral: Negative.     Objective:  BP 122/78 (BP Location: Left Arm, Patient Position: Sitting, Cuff Size: Normal)   Pulse (!) 103   Temp 98 F (36.7 C) (Oral)   Ht 5\' 2"  (1.575 m)   Wt 149 lb (67.6 kg)   LMP 03/30/2016   SpO2 98%   BMI 27.25 kg/m   BP Readings from Last 3 Encounters:  06/17/17 122/78  06/16/17 132/86  05/30/17 100/70    Wt Readings from Last 3 Encounters:  06/17/17 149 lb (67.6 kg)  06/16/17 149 lb (67.6 kg)  05/30/17 147 lb 9.6 oz (67 kg)    Physical Exam  Constitutional: She is oriented to person, place, and time. She appears well-developed. No distress.  HENT:  Head: Normocephalic and atraumatic.  Right Ear: External ear normal.  Left Ear: External ear normal.  Eyes: Pupils are equal, round, and reactive to light. Right eye exhibits no discharge. Left eye exhibits no discharge. No scleral icterus.  Neck: Neck supple. No JVD present. No tracheal deviation present. No thyromegaly present.  Cardiovascular: Normal rate, regular rhythm and normal heart sounds.  Pulmonary/Chest: Effort  normal and breath sounds normal. No stridor.  Abdominal: Bowel sounds are normal.  Lymphadenopathy:    She has no cervical adenopathy.  Neurological: She is alert and oriented to person, place, and time.  Skin: Skin is warm and dry. She is not diaphoretic.  Psychiatric: She has a normal mood and affect. Her behavior is normal.    Lab Results  Component Value Date   WBC 7.6 06/02/2014   HGB 12.5 06/02/2014   HCT 37.1 06/02/2014   PLT 283 06/02/2014    Dg Knee Bilateral Standing Ap  Result  Date: 06/17/2017 CLINICAL DATA:  Chronic bilateral knee pain x6 months, intermittent, no known injury EXAM: BILATERAL KNEES STANDING - 1 VIEW COMPARISON:  None. FINDINGS: No evidence of fracture or dislocation. Medial and lateral joint space compartments are preserved bilaterally on this single standing view. Visualized soft tissues are unremarkable. IMPRESSION: Negative. Electronically Signed   By: Julian Hy M.D.   On: 06/17/2017 08:11    Assessment & Plan:   Jaquesha was seen today for possible strep throat.  Diagnoses and all orders for this visit:  Pharyngitis, unspecified etiology -     POCT rapid strep A -     penicillin v potassium (VEETID) 500 MG tablet; Take 1 tablet (500 mg total) by mouth 3 (three) times daily for 10 days.   I am having Elizabeth Meyer start on penicillin v potassium. I am also having her maintain her Ibuprofen, pseudoephedrine, cetirizine, fexofenadine, fluticasone, traZODone, ranitidine, Vitamin D (Ergocalciferol), and Diclofenac Sodium.  Meds ordered this encounter  Medications  . penicillin v potassium (VEETID) 500 MG tablet    Sig: Take 1 tablet (500 mg total) by mouth 3 (three) times daily for 10 days.    Dispense:  30 tablet    Refill:  0    Allow patient to hold.   Strep test was negative.  I am concerned about the sore throat without respiratory tract symptoms.  Will send in a prescription for penicillin to hold.  She will started at her sore throat is not improved in a few days.  Recommended over-the-counter ibuprofen or Tylenol as needed.  Follow-up: No Follow-up on file.  Libby Maw, MD

## 2017-07-03 ENCOUNTER — Ambulatory Visit: Payer: 59 | Attending: Family Medicine | Admitting: Physical Therapy

## 2017-07-03 ENCOUNTER — Other Ambulatory Visit: Payer: Self-pay

## 2017-07-03 ENCOUNTER — Encounter: Payer: Self-pay | Admitting: Physical Therapy

## 2017-07-03 DIAGNOSIS — M25562 Pain in left knee: Secondary | ICD-10-CM | POA: Diagnosis not present

## 2017-07-03 DIAGNOSIS — M25662 Stiffness of left knee, not elsewhere classified: Secondary | ICD-10-CM | POA: Insufficient documentation

## 2017-07-03 DIAGNOSIS — R262 Difficulty in walking, not elsewhere classified: Secondary | ICD-10-CM | POA: Diagnosis present

## 2017-07-03 DIAGNOSIS — M25561 Pain in right knee: Secondary | ICD-10-CM | POA: Insufficient documentation

## 2017-07-03 NOTE — Therapy (Signed)
[address] [place], [place], [address] Phone: [phone]   Fax:  [phone]  Physical Therapy Treatment  Patient Details  Name: Elizabeth Meyer MRN: [id number] Date of Birth: 04-05-1961 Referring Provider: Creig Hines   Encounter Date: 07/03/2017  PT End of Session - 07/03/17 1310    Visit Number  1    Date for PT Re-Evaluation  09/02/17    PT Start Time  0926    PT Stop Time  1016    PT Time Calculation (min)  50 min    Activity Tolerance  Patient tolerated treatment well    Behavior During Therapy  [facility] for tasks assessed/performed       Past Medical History:  Diagnosis Date  . Complex cyst of left ovary   . Endometrial polyp   . Heart murmur Dr Johnsie Cancel   dx 2012--  Benign--  per echo abnormal mitral inflow  . History of gastrointestinal ulcer   . IBS (irritable bowel syndrome)   . Seasonal allergies   . Wears contact lenses     Past Surgical History:  Procedure Laterality Date  . CARPAL TUNNEL RELEASE Left 05/15/2017   Procedure: LEFT CARPAL TUNNEL RELEASE;  Surgeon: [name], MD;  Location: [facility];  Service: Orthopedics;  Laterality: Left;  . COLONOSCOPY WITH PROPOFOL  last one 04-28-2014  . CYSTOSCOPY N/A 06/06/2014   Procedure: CYSTOSCOPY;  Surgeon: [name], MD;  Location: [facility];  Service: Gynecology;  Laterality: N/A;  . DILATATION & CURRETTAGE/HYSTEROSCOPY WITH RESECTOCOPE N/A 06/06/2014   Procedure: DILATATION & CURETTAGE/HYSTEROSCOPY WITH RESECTOCOPE;  Surgeon: [name], MD;  Location: [facility];  Service: Gynecology;  Laterality: N/A;  . LAPAROSCOPIC SALPINGO OOPHERECTOMY Left 06/06/2014   Procedure: LAPAROSCOPIC LEFT SALPINGO OOPHORECTOMY;  Surgeon: [name], MD;  Location: [facility];  Service: Gynecology;  Laterality: Left;  . PLANTAR FASCIA SURGERY Left 2009   topaz procedure  . TRANSTHORACIC  ECHOCARDIOGRAM  08-12-2007   normal LV,  ef 65-70%,  abnormal mitral inflow with trivial MR,  trivial PR and TR    There were no vitals filed for this visit.  Subjective Assessment - 07/03/17 0935    Subjective  Patient reports that she has had bilateal knee pain for about a year, left > right, reports noticed it while squatting.  Had injections in October that lasted 4 months, now pain is back, she reports that she was bad and did not do exercises because the shots stopped the pain.      Limitations  Standing;House hold activities    Patient Stated Goals  have less pain, squat    Currently in Pain?  Yes    Pain Score  1     Pain Location  Knee    Pain Orientation  Right;Left    Pain Descriptors / Indicators  Aching    Pain Type  Acute pain    Pain Radiating Towards  reports some pain in the back and the hips as well, she worries it is how she is walking    Pain Onset  More than a month ago    Pain Frequency  Constant    Aggravating Factors   squatting, stairs, walking, pain up to 8/10    Pain Relieving Factors  rest, ice, advil at best a 2/10    Effect of Pain on Daily Activities  limits walking, squatting  [facility] PT Assessment - 07/03/17 0001      Assessment   Medical Diagnosis  bilateral patellofemoral syndrome    Referring Provider  Z. Smith    Onset Date/Surgical Date  06/16/17    Prior Therapy  no      Precautions   Precautions  None      Balance Screen   Has the patient fallen in the past 6 months  No    Has the patient had a decrease in activity level because of a fear of falling?   No    Is the patient reluctant to leave their home because of a fear of falling?   No      Home Environment   Additional Comments  has stairs at home, does the housework, does some yardwork      Prior Function   Level of Independence  Independent    Vocation  Part time employment    Vocation Requirements  has to squat, get down in the little chairs    Leisure  no exercise       ROM / Strength   AROM / PROM / Strength  AROM;Strength      AROM   Overall AROM Comments  pain into flexion, passive flexion caused a lot of pain    AROM Assessment Site  Knee    Right/Left Knee  Right;Left    Right Knee Extension  3    Right Knee Flexion  115    Left Knee Extension  3    Left Knee Flexion  105      Strength   Overall Strength Comments  left knee 4-/5 with mild pain, right knee 4/5      Flexibility   Soft Tissue Assessment /Muscle Length  yes    Hamstrings  tight    Quadriceps  very tight    ITB  very tight    Piriformis  very tight      Palpation   Palpation comment  mild lateral tracking patellae, she is ternder in the ITB      Ambulation/Gait   Gait Comments  no device, slight antalgic gait on the left, c/o pain down the stairs, there is audible and palpable crepitus             No data recorded               PT Education - 07/03/17 1309    Education provided  Yes    Education Details  HEP for flexibility and VMO    Person(s) Educated  Patient    Methods  Explanation;Demonstration;Tactile cues;Verbal cues;Handout    Comprehension  Returned demonstration;Verbalized understanding;Tactile cues required       PT Short Term Goals - 07/03/17 1428      PT SHORT TERM GOAL #1   Title  independent with initial HEP    Time  2    Period  Weeks    Status  New        PT Long Term Goals - 07/03/17 1513      PT LONG TERM GOAL #1   Title  squat wihtout pain > 3/10    Time  8    Period  Weeks    Status  New      PT LONG TERM GOAL #2   Title  go up and down stairs step over step without pain > 3/10    Time  8    Period  Weeks  Status  New      PT LONG TERM GOAL #3   Title  increase AROM to 0-120 degrees bilaterally    Time  8    Period  Weeks    Status  New      PT LONG TERM GOAL #4   Title  increase strength to 4+/5    Time  8    Period  Weeks    Status  New            Plan - 07/03/17 1310    Clinical  Impression Statement  Patient reports some knee pain for about a year, she reports cortisone injections helped and she stopped exercises.  The pain returned and the MD felt she needed PT, she has lateral tracking patellae, she is very tight in the LE's especially Quads.  Mile lateral tracking patellae.  Difficulty with squatting and stairs.    Clinical Presentation  Stable    Clinical Decision Making  Low    Rehab Potential  Good    PT Frequency  2x / week    PT Duration  8 weeks    PT Treatment/Interventions  ADLs/Self Care Home Management;Iontophoresis 4mg /ml Dexamethasone;Cryotherapy;Electrical Stimulation;Moist Heat;Gait training;Neuromuscular re-education;Balance training;Therapeutic exercise;Therapeutic activities;Functional mobility training;Stair training;Patient/family education;Manual techniques;Vasopneumatic Device    Consulted and Agree with Plan of Care  Patient       Patient will benefit from skilled therapeutic intervention in order to improve the following deficits and impairments:  Abnormal gait, Decreased range of motion, Difficulty walking, Pain, Impaired flexibility, Decreased strength  Visit Diagnosis: Acute pain of left knee - Plan: PT plan of care cert/re-cert  Acute pain of right knee - Plan: PT plan of care cert/re-cert  Stiffness of left knee, not elsewhere classified - Plan: PT plan of care cert/re-cert  Difficulty in walking, not elsewhere classified - Plan: PT plan of care cert/re-cert     Problem List Patient Active Problem List   Diagnosis Date Noted  . Elevated LDL cholesterol level 06/17/2017  . Acquired hypothyroidism 06/17/2017  . Post-menopausal 06/17/2017  . Cough 05/30/2017  . Suprapubic pain 05/30/2017  . Patellofemoral syndrome of both knees 12/19/2016  . Allergic dermatitis 09/16/2015  . Plantar fasciitis, right 07/27/2014  . Loss of transverse plantar arch 07/27/2014  . Pharyngitis 06/21/2014  . 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     Class: Present on Admission  . Endometrial polyp 06/06/2014    Class: Present on Admission  . Sinusitis, acute frontal 06/03/2014  . IBS (irritable bowel syndrome) 03/08/2014    Sumner Boast., PT 07/03/2017, 3:17 PM  [address] [place], [place], [address] Phone: [phone]   Fax:  [phone]  Name: NAVAEH KEHRES MRN: [id number] Date of Birth: 1961-03-01

## 2017-07-09 ENCOUNTER — Ambulatory Visit: Payer: 59 | Attending: Family Medicine | Admitting: Physical Therapy

## 2017-07-09 ENCOUNTER — Ambulatory Visit: Payer: 59 | Admitting: Family Medicine

## 2017-07-09 ENCOUNTER — Encounter: Payer: Self-pay | Admitting: Physical Therapy

## 2017-07-09 DIAGNOSIS — R262 Difficulty in walking, not elsewhere classified: Secondary | ICD-10-CM

## 2017-07-09 DIAGNOSIS — M25561 Pain in right knee: Secondary | ICD-10-CM

## 2017-07-09 DIAGNOSIS — M25662 Stiffness of left knee, not elsewhere classified: Secondary | ICD-10-CM

## 2017-07-09 DIAGNOSIS — M25562 Pain in left knee: Secondary | ICD-10-CM

## 2017-07-09 NOTE — Therapy (Signed)
[address] [place], [place], [address] Phone: [phone]   Fax:  [phone]  Physical Therapy Treatment  Patient Details  Name: Elizabeth Meyer MRN: [id number] Date of Birth: [birth year] Referring Provider: Creig Hines   Encounter Date: 07/09/2017  PT End of Session - 07/09/17 1703    Visit Number  2    Date for PT Re-Evaluation  09/02/17    PT Start Time  0762    PT Stop Time  1710    PT Time Calculation (min)  56 min    Activity Tolerance  Patient tolerated treatment well    Behavior During Therapy  [facility] for tasks assessed/performed       Past Medical History:  Diagnosis Date  . Complex cyst of left ovary   . Endometrial polyp   . Heart murmur Dr Johnsie Cancel   dx 2012--  Benign--  per echo abnormal mitral inflow  . History of gastrointestinal ulcer   . IBS (irritable bowel syndrome)   . Seasonal allergies   . Wears contact lenses     Past Surgical History:  Procedure Laterality Date  . CARPAL TUNNEL RELEASE Left 05/15/2017   Procedure: LEFT CARPAL TUNNEL RELEASE;  Surgeon: [name], MD;  Location: [place];  Service: Orthopedics;  Laterality: Left;  . COLONOSCOPY WITH PROPOFOL  last one 04-28-2014  . CYSTOSCOPY N/A 06/06/2014   Procedure: CYSTOSCOPY;  Surgeon: [name], MD;  Location: [facility];  Service: Gynecology;  Laterality: N/A;  . DILATATION & CURRETTAGE/HYSTEROSCOPY WITH RESECTOCOPE N/A 06/06/2014   Procedure: DILATATION & CURETTAGE/HYSTEROSCOPY WITH RESECTOCOPE;  Surgeon: [name], MD;  Location: [place];  Service: Gynecology;  Laterality: N/A;  . LAPAROSCOPIC SALPINGO OOPHERECTOMY Left 06/06/2014   Procedure: LAPAROSCOPIC LEFT SALPINGO OOPHORECTOMY;  Surgeon: [name], MD;  Location: [place];  Service: Gynecology;  Laterality: Left;  . PLANTAR FASCIA SURGERY Left 2009   topaz procedure  . TRANSTHORACIC  ECHOCARDIOGRAM  08-12-2007   normal LV,  ef 65-70%,  abnormal mitral inflow with trivial MR,  trivial PR and TR    There were no vitals filed for this visit.  Subjective Assessment - 07/09/17 1617    Subjective  Patient reports that she was having some increased pain over the wekend, reports that the past two days have been a lot better , less pain.  Has some questions about HEP    Currently in Pain?  Yes    Pain Score  1     Pain Location  Knee    Pain Orientation  Right;Left                       [facility] Adult PT Treatment/Exercise - 07/09/17 0001      Exercises   Exercises  Knee/Hip      Knee/Hip Exercises: Stretches   Passive Hamstring Stretch  Both;3 reps;20 seconds    Quad Stretch  3 reps;20 seconds    ITB Stretch  3 reps;20 seconds    Piriformis Stretch  3 reps;20 seconds      Knee/Hip Exercises: Aerobic   Nustep  Level 5 x 5 minutes      Knee/Hip Exercises: Machines for Strengthening   Cybex Leg Press  20# 2x10 with ball squeeze      Knee/Hip Exercises: Standing   Walking with Sports Cord  fwd and backward x 60 feet  Knee/Hip Exercises: Supine   Quad Sets  2 sets;10 reps;Both    Quad Sets Limitations  3#    Short Arc [employer]  Both;2 sets;10 reps    Short Arc [employer] Limitations  3#    Bridges with [employer]  2 sets;10 reps      Knee/Hip Exercises: Sidelying   Hip ADduction  2 sets;10 reps;Both    Hip ADduction Limitations  3#      Modalities   Modalities  Moist Heat;Electrical Stimulation      Moist Heat Therapy   Number Minutes Moist Heat  15 Minutes    Moist Heat Location  Lumbar Spine      Electrical Stimulation   Electrical Stimulation Location  L/S area    Electrical Stimulation Action  IFC    Electrical Stimulation Parameters  supine    Electrical Stimulation Goals  Pain               PT Short Term Goals - 07/09/17 1705      PT SHORT TERM GOAL #1   Title  independent with initial HEP    Status  Partially  Met        PT Long Term Goals - 07/03/17 1513      PT LONG TERM GOAL #1   Title  squat wihtout pain > 3/10    Time  8    Period  Weeks    Status  New      PT LONG TERM GOAL #2   Title  go up and down stairs step over step without pain > 3/10    Time  8    Period  Weeks    Status  New      PT LONG TERM GOAL #3   Title  increase AROM to 0-120 degrees bilaterally    Time  8    Period  Weeks    Status  New      PT LONG TERM GOAL #4   Title  increase strength to 4+/5    Time  8    Period  Weeks    Status  New            Plan - 07/09/17 1703    Clinical Impression Statement  Patient with a lot of difficulty squatting, has most issue with left knee, she was not doing HEP correctly needed some cues to get better VMO, I feel like she has it now, but needed verbal and a lot of tactile cues.  She does have back pain possibly due to the tightness in the LE's and a change in how she walks    PT Next Visit Plan  work on [employer] and flexibility    Consulted and Agree with Plan of Care  Patient       Patient will benefit from skilled therapeutic intervention in order to improve the following deficits and impairments:  Abnormal gait, Decreased range of motion, Difficulty walking, Pain, Impaired flexibility, Decreased strength  Visit Diagnosis: Acute pain of left knee  Acute pain of right knee  Stiffness of left knee, not elsewhere classified  Difficulty in walking, not elsewhere classified     Problem List Patient Active Problem List   Diagnosis Date Noted  . Elevated LDL cholesterol level 06/17/2017  . Acquired hypothyroidism 06/17/2017  . Post-menopausal 06/17/2017  . Cough 05/30/2017  . Suprapubic pain 05/30/2017  . Patellofemoral syndrome of both knees 12/19/2016  . Allergic dermatitis 09/16/2015  . Plantar  fasciitis, right 07/27/2014  . Loss of transverse plantar arch 07/27/2014  . Pharyngitis 06/21/2014  . 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    Class:  Present on Admission  . Endometrial polyp 06/06/2014    Class: Present on Admission  . Sinusitis, acute frontal 06/03/2014  . IBS (irritable bowel syndrome) 03/08/2014    Sumner Boast., PT 07/09/2017, 5:11 PM  [address] [place], [place], [address] Phone: [phone]   Fax:  [phone]  Name: Elizabeth Meyer MRN: [id number] Date of Birth: October 02, 1960

## 2017-07-17 ENCOUNTER — Ambulatory Visit: Payer: 59 | Admitting: Physical Therapy

## 2017-07-17 DIAGNOSIS — M25561 Pain in right knee: Secondary | ICD-10-CM

## 2017-07-17 DIAGNOSIS — M25562 Pain in left knee: Secondary | ICD-10-CM | POA: Diagnosis not present

## 2017-07-17 DIAGNOSIS — M25662 Stiffness of left knee, not elsewhere classified: Secondary | ICD-10-CM

## 2017-07-17 DIAGNOSIS — R262 Difficulty in walking, not elsewhere classified: Secondary | ICD-10-CM

## 2017-07-17 NOTE — Therapy (Signed)
[address] [address] [place], [place], [address] Phone: [phone]   Fax:  [phone]  Physical Therapy Treatment  Patient Details  Name: Elizabeth Meyer MRN: [id number] Date of Birth: Mar 12, 1961 Referring Provider: Creig Hines   Encounter Date: 07/17/2017  PT End of Session - 07/17/17 1133    Visit Number  3    Date for PT Re-Evaluation  09/02/17    PT Start Time  1057    PT Stop Time  1150    PT Time Calculation (min)  53 min       Past Medical History:  Diagnosis Date  . Complex cyst of left ovary   . Endometrial polyp   . Heart murmur Dr Johnsie Cancel   dx 2012--  Benign--  per echo abnormal mitral inflow  . History of gastrointestinal ulcer   . IBS (irritable bowel syndrome)   . Seasonal allergies   . Wears contact lenses     Past Surgical History:  Procedure Laterality Date  . CARPAL TUNNEL RELEASE Left 05/15/2017   Procedure: LEFT CARPAL TUNNEL RELEASE;  Surgeon: [name], MD;  Location: [place];  Service: Orthopedics;  Laterality: Left;  . COLONOSCOPY WITH PROPOFOL  last one 04-28-2014  . CYSTOSCOPY N/A 06/06/2014   Procedure: CYSTOSCOPY;  Surgeon: [name], MD;  Location: [facility];  Service: Gynecology;  Laterality: N/A;  . DILATATION & CURRETTAGE/HYSTEROSCOPY WITH RESECTOCOPE N/A 06/06/2014   Procedure: DILATATION & CURETTAGE/HYSTEROSCOPY WITH RESECTOCOPE;  Surgeon: [name], MD;  Location: [place];  Service: Gynecology;  Laterality: N/A;  . LAPAROSCOPIC SALPINGO OOPHERECTOMY Left 06/06/2014   Procedure: LAPAROSCOPIC LEFT SALPINGO OOPHORECTOMY;  Surgeon: [name], MD;  Location: [place];  Service: Gynecology;  Laterality: Left;  . PLANTAR FASCIA SURGERY Left 2009   topaz procedure  . TRANSTHORACIC ECHOCARDIOGRAM  08-12-2007   normal LV,  ef 65-70%,  abnormal mitral inflow with trivial MR,  trivial PR and TR     There were no vitals filed for this visit.  Subjective Assessment - 07/17/17 1100    Subjective  some of hip ex are aggravating me. no real consistant pain    Currently in Pain?  No/denies                       [facility] Adult PT Treatment/Exercise - 07/17/17 0001      Knee/Hip Exercises: Aerobic   Nustep  L 5 6 min      Knee/Hip Exercises: Machines for Strengthening   Cybex Leg Press  20# 3x10 with ball squeeze      Knee/Hip Exercises: Standing   Lateral Step Up  Both;15 reps;Hand Hold: 1;Step Height: 6" opp leg abd, plus 15 add step ups      Knee/Hip Exercises: Seated   Long Arc Quad  Strengthening;Both;2 sets;10 reps with VMO ball squeeze and green tband      Knee/Hip Exercises: Supine   Bridges with Ball Squeeze  Both;2 sets;10 reps    Bridges with Clamshell  Strengthening;Both;2 sets;10 reps    Other Supine Knee/Hip Exercises  bridge KTC,obl  and abd with ball 15 each      Modalities   Modalities  Moist Heat;Electrical Stimulation      Moist Heat Therapy   Number Minutes Moist Heat  15 Minutes    Moist Heat Location  Lumbar Spine      Electrical Stimulation  Electrical Stimulation Location  L/S area    Chartered certified [employer]  IFC    Electrical Stimulation Parameters  supine    Electrical Stimulation Goals  Pain             PT Education - 07/17/17 1116    Education provided  Yes    Education Details  changed IT stretch and reviewed others to decrease hip strain    Person(s) Educated  Patient    Methods  Explanation    Comprehension  Verbalized understanding;Returned demonstration       PT Short Term Goals - 07/17/17 1135      PT SHORT TERM GOAL #1   Title  independent with initial HEP    Status  Achieved        PT Long Term Goals - 07/03/17 1513      PT LONG TERM GOAL #1   Title  squat wihtout pain > 3/10    Time  8    Period  Weeks    Status  New      PT LONG TERM GOAL #2   Title  go up and down stairs step over  step without pain > 3/10    Time  8    Period  Weeks    Status  New      PT LONG TERM GOAL #3   Title  increase AROM to 0-120 degrees bilaterally    Time  8    Period  Weeks    Status  New      PT LONG TERM GOAL #4   Title  increase strength to 4+/5    Time  8    Period  Weeks    Status  New            Plan - 07/17/17 1134    Clinical Impression Statement  cuing needed with ex to activate VMO and core . reviewed and adjusted HEP/stretches. STG met    PT Treatment/Interventions  ADLs/Self Care Home Management;Iontophoresis 30m/ml Dexamethasone;Cryotherapy;Electrical Stimulation;Moist Heat;Gait training;Neuromuscular re-education;Balance training;Therapeutic exercise;Therapeutic activities;Functional mobility training;Stair training;Patient/family education;Manual techniques;Vasopneumatic Device    PT Next Visit Plan  work on V[employer]and flexibility       Patient will benefit from skilled therapeutic intervention in order to improve the following deficits and impairments:  Abnormal gait, Decreased range of motion, Difficulty walking, Pain, Impaired flexibility, Decreased strength  Visit Diagnosis: Acute pain of left knee  Acute pain of right knee  Stiffness of left knee, not elsewhere classified  Difficulty in walking, not elsewhere classified     Problem List Patient Active Problem List   Diagnosis Date Noted  . Elevated LDL cholesterol level 06/17/2017  . Acquired hypothyroidism 06/17/2017  . Post-menopausal 06/17/2017  . Cough 05/30/2017  . Suprapubic pain 05/30/2017  . Patellofemoral syndrome of both knees 12/19/2016  . Allergic dermatitis 09/16/2015  . Plantar fasciitis, right 07/27/2014  . Loss of transverse plantar arch 07/27/2014  . Pharyngitis 06/21/2014  . 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    Class: Present on Admission  . Endometrial polyp 06/06/2014    Class: Present on Admission  . Sinusitis, acute frontal 06/03/2014  . IBS (irritable bowel  syndrome) 03/08/2014    PAYSEUR,ANGIE PTA 07/17/2017, 11:36 AM  [address] [address]Phone: [phone]  Fax:  [phone] Name: Elizabeth MATOSMRN: [id number] of Birth: 112/21/1962

## 2017-07-30 ENCOUNTER — Ambulatory Visit: Payer: 59 | Admitting: Physical Therapy

## 2017-07-31 ENCOUNTER — Ambulatory Visit: Payer: 59 | Admitting: Family Medicine

## 2017-07-31 DIAGNOSIS — Z01419 Encounter for gynecological examination (general) (routine) without abnormal findings: Secondary | ICD-10-CM | POA: Diagnosis not present

## 2017-07-31 DIAGNOSIS — Z6826 Body mass index (BMI) 26.0-26.9, adult: Secondary | ICD-10-CM | POA: Diagnosis not present

## 2017-08-06 ENCOUNTER — Ambulatory Visit: Payer: 59 | Attending: Family Medicine | Admitting: Physical Therapy

## 2017-08-06 ENCOUNTER — Encounter: Payer: Self-pay | Admitting: Physical Therapy

## 2017-08-06 DIAGNOSIS — R262 Difficulty in walking, not elsewhere classified: Secondary | ICD-10-CM | POA: Insufficient documentation

## 2017-08-06 DIAGNOSIS — M25662 Stiffness of left knee, not elsewhere classified: Secondary | ICD-10-CM | POA: Diagnosis not present

## 2017-08-06 DIAGNOSIS — M25561 Pain in right knee: Secondary | ICD-10-CM | POA: Diagnosis not present

## 2017-08-06 DIAGNOSIS — M25562 Pain in left knee: Secondary | ICD-10-CM | POA: Diagnosis not present

## 2017-08-06 NOTE — Therapy (Signed)
[address] [address] [place], [place], [address] Phone: [phone]   Fax:  [phone]  Physical Therapy Treatment  Patient Details  Name: Elizabeth Meyer MRN: [id number] Date of Birth: 1961/03/31 Referring Provider: Creig Hines   Encounter Date: 08/06/2017  PT End of Session - 08/06/17 1435    Visit Number  4    Date for PT Re-Evaluation  09/02/17    PT Start Time  1347    PT Stop Time  1447    PT Time Calculation (min)  60 min    Activity Tolerance  Patient tolerated treatment well    Behavior During Therapy  [facility] for tasks assessed/performed       Past Medical History:  Diagnosis Date  . Complex cyst of left ovary   . Endometrial polyp   . Heart murmur Dr Johnsie Cancel   dx 2012--  Benign--  per echo abnormal mitral inflow  . History of gastrointestinal ulcer   . IBS (irritable bowel syndrome)   . Seasonal allergies   . Wears contact lenses     Past Surgical History:  Procedure Laterality Date  . CARPAL TUNNEL RELEASE Left 05/15/2017   Procedure: LEFT CARPAL TUNNEL RELEASE;  Surgeon: [name], MD;  Location: [place];  Service: Orthopedics;  Laterality: Left;  . COLONOSCOPY WITH PROPOFOL  last one 04-28-2014  . CYSTOSCOPY N/A 06/06/2014   Procedure: CYSTOSCOPY;  Surgeon: [name], MD;  Location: [facility];  Service: Gynecology;  Laterality: N/A;  . DILATATION & CURRETTAGE/HYSTEROSCOPY WITH RESECTOCOPE N/A 06/06/2014   Procedure: DILATATION & CURETTAGE/HYSTEROSCOPY WITH RESECTOCOPE;  Surgeon: [name], MD;  Location: [place];  Service: Gynecology;  Laterality: N/A;  . LAPAROSCOPIC SALPINGO OOPHERECTOMY Left 06/06/2014   Procedure: LAPAROSCOPIC LEFT SALPINGO OOPHORECTOMY;  Surgeon: [name], MD;  Location: [place];  Service: Gynecology;  Laterality: Left;  . PLANTAR FASCIA SURGERY Left 2009   topaz procedure  . TRANSTHORACIC  ECHOCARDIOGRAM  08-12-2007   normal LV,  ef 65-70%,  abnormal mitral inflow with trivial MR,  trivial PR and TR    There were no vitals filed for this visit.  Subjective Assessment - 08/06/17 1351    Subjective  pt reports that she is unable to squat down and she isn't able to get in out of the tub. She hasn't been able to do her HEP because her mom passed away two weeks ago.    Currently in Pain?  Yes    Pain Score  1     Pain Location  Knee    Pain Orientation  Right;Left                       [facility] Adult PT Treatment/Exercise - 08/06/17 0001      Knee/Hip Exercises: Stretches   Passive Hamstring Stretch  Both;3 reps;20 seconds    Quad Stretch  5 reps;20 seconds;Left pt positioned in sidelying     ITB Stretch  3 reps;20 seconds      Knee/Hip Exercises: Aerobic   Nustep  L2 41min       Knee/Hip Exercises: Seated   Long Arc Quad  Strengthening;Both;2 sets;10 reps    Heel Slides  -- with the ball squeeze      Knee/Hip Exercises: Supine   Short Arc Quad Sets  Both;2 sets;10 reps    Short Arc Quad Sets Limitations  2lbs  Bridges with [employer]  2 sets;10 reps    Straight Leg Raises  2 sets;10 reps with ball.     Straight Leg Raise with External Rotation  2 sets;10 reps      Modalities   Modalities  Electrical Stimulation;Moist Heat      Moist Heat Therapy   Number Minutes Moist Heat  15 Minutes    Moist Heat Location  Lumbar Spine      Electrical Stimulation   Electrical Stimulation Location  L/S area    Electrical Stimulation Action  IFC    Electrical Stimulation Parameters  supine    Electrical Stimulation Goals  Pain      Manual Therapy   Manual Therapy  Passive ROM Into end range             PT Education - 08/06/17 1434    Education provided  Yes    Education Details  added straight leg raise with external rotation.    Person(s) Educated  Patient    Methods  Explanation;Demonstration;Handout    Comprehension  Returned  demonstration;Verbalized understanding       PT Short Term Goals - 07/17/17 1135      PT SHORT TERM GOAL #1   Title  independent with initial HEP    Status  Achieved        PT Long Term Goals - 07/03/17 1513      PT LONG TERM GOAL #1   Title  squat wihtout pain > 3/10    Time  8    Period  Weeks    Status  New      PT LONG TERM GOAL #2   Title  go up and down stairs step over step without pain > 3/10    Time  8    Period  Weeks    Status  New      PT LONG TERM GOAL #3   Title  increase AROM to 0-120 degrees bilaterally    Time  8    Period  Weeks    Status  New      PT LONG TERM GOAL #4   Title  increase strength to 4+/5    Time  8    Period  Weeks    Status  New            Plan - 08/06/17 1436    Clinical Impression Statement  Pt reported not doing her exercises due to a death in the family. She verbally reports having difficulty with squatting and sitting down in low chairs. she needed vc to activate quad and VMO during  LAQ,SLR, and SAQ. Pt did take 2 aleve before e-stim application due to the anticipation of pain.    Rehab Potential  Good    PT Frequency  2x / week    PT Duration  8 weeks    PT Treatment/Interventions  ADLs/Self Care Home Management;Iontophoresis 4mg /ml Dexamethasone;Cryotherapy;Electrical Stimulation;Moist Heat;Gait training;Neuromuscular re-education;Balance training;Therapeutic exercise;Therapeutic activities;Functional mobility training;Stair training;Patient/family education;Manual techniques;Vasopneumatic Device    PT Next Visit Plan  work on VMO and increase flexibility in hamstrings and ITB       Patient will benefit from skilled therapeutic intervention in order to improve the following deficits and impairments:  Abnormal gait, Decreased range of motion, Difficulty walking, Pain, Impaired flexibility, Decreased strength  Visit Diagnosis: Acute pain of left knee  Acute pain of right knee  Stiffness of left knee, not elsewhere  classified  Difficulty in walking, not  elsewhere classified     Problem List Patient Active Problem List   Diagnosis Date Noted  . Elevated LDL cholesterol level 06/17/2017  . Acquired hypothyroidism 06/17/2017  . Post-menopausal 06/17/2017  . Cough 05/30/2017  . Suprapubic pain 05/30/2017  . Patellofemoral syndrome of both knees 12/19/2016  . Allergic dermatitis 09/16/2015  . Plantar fasciitis, right 07/27/2014  . Loss of transverse plantar arch 07/27/2014  . Pharyngitis 06/21/2014  . 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    Class: Present on Admission  . Endometrial polyp 06/06/2014    Class: Present on Admission  . Sinusitis, acute frontal 06/03/2014  . IBS (irritable bowel syndrome) 03/08/2014    Loyal Gambler 08/06/2017, 2:41 PM  [place] [place], [place], [address] Phone: [phone]   Fax:  [phone]  Name: DECLYNN LOPRESTI MRN: [id number] Date of Birth: 1960/11/30

## 2017-08-14 ENCOUNTER — Ambulatory Visit: Payer: 59 | Admitting: Physical Therapy

## 2017-08-14 DIAGNOSIS — M25562 Pain in left knee: Secondary | ICD-10-CM | POA: Diagnosis not present

## 2017-08-14 DIAGNOSIS — M25561 Pain in right knee: Secondary | ICD-10-CM

## 2017-08-14 DIAGNOSIS — M25662 Stiffness of left knee, not elsewhere classified: Secondary | ICD-10-CM

## 2017-08-14 DIAGNOSIS — R262 Difficulty in walking, not elsewhere classified: Secondary | ICD-10-CM

## 2017-08-14 NOTE — Therapy (Addendum)
[address] [place], [place], [address] Phone: [phone]   Fax:  [phone]  Physical Therapy Treatment  Patient Details  Name: Elizabeth Meyer MRN: [id number] Date of Birth: 03-Jun-1960 Referring Provider: Creig Meyer   Encounter Date: 08/14/2017  PT End of Session - 08/14/17 0926    Visit Number  5    Date for PT Re-Evaluation  09/02/17    PT Start Time  0845    PT Stop Time  0930    PT Time Calculation (min)  45 min       Past Medical History:  Diagnosis Date  . Complex cyst of left ovary   . Endometrial polyp   . Heart murmur Dr Elizabeth Meyer   dx 2012--  Benign--  per echo abnormal mitral inflow  . History of gastrointestinal ulcer   . IBS (irritable bowel syndrome)   . Seasonal allergies   . Wears contact lenses     Past Surgical History:  Procedure Laterality Date  . CARPAL TUNNEL RELEASE Left 05/15/2017   Procedure: LEFT CARPAL TUNNEL RELEASE;  Surgeon: Elizabeth Brod, MD;  Location: [place];  Service: Orthopedics;  Laterality: Left;  . COLONOSCOPY WITH PROPOFOL  last one 04-28-2014  . CYSTOSCOPY N/A 06/06/2014   Procedure: CYSTOSCOPY;  Surgeon: Elizabeth Chamber, MD;  Location: [facility];  Service: Gynecology;  Laterality: N/A;  . DILATATION & CURRETTAGE/HYSTEROSCOPY WITH RESECTOCOPE N/A 06/06/2014   Procedure: DILATATION & CURETTAGE/HYSTEROSCOPY WITH RESECTOCOPE;  Surgeon: Elizabeth Chamber, MD;  Location: [place];  Service: Gynecology;  Laterality: N/A;  . LAPAROSCOPIC SALPINGO OOPHERECTOMY Left 06/06/2014   Procedure: LAPAROSCOPIC LEFT SALPINGO OOPHORECTOMY;  Surgeon: Elizabeth Chamber, MD;  Location: [place];  Service: Gynecology;  Laterality: Left;  . PLANTAR FASCIA SURGERY Left 2009   topaz procedure  . TRANSTHORACIC ECHOCARDIOGRAM  08-12-2007   normal LV,  ef 65-70%,  abnormal mitral inflow with trivial MR,  trivial PR and TR     There were no vitals filed for this visit.  Subjective Assessment - 08/14/17 0843    Subjective  very bad for 3- 4 days after last session. squatting and bending is still biggest issue. I can not just keep coming so I need ex to do at [facility]    Currently in Pain?  Yes    Pain Score  3     Pain Location  Knee    Pain Orientation  Right;Left                       [facility] Adult PT Treatment/Exercise - 08/14/17 0001      Self-Care   Self-Care  ADL's;Lifting;Heat/Ice Application;Posture      Knee/Hip Exercises: Aerobic   Nustep  L 5 42mn      Knee/Hip Exercises: Machines for Strengthening   Cybex Knee Extension  5# with ball squeeze 2 sets 10    Cybex Knee Flexion  15# 2 sets 10    Cybex Leg Press  20# 2x10 with ball squeeze      Knee/Hip Exercises: Seated   Ball Squeeze  20    Clamshell with [employer]  Green    Other Seated Knee/Hip Exercises  trunk flex and ex with black tband 20 reps each               PT Short Term Goals - 07/17/17 1135  PT SHORT TERM GOAL #1   Title  independent with initial HEP    Status  Achieved        PT Long Term Goals - 08/14/17 0904      PT LONG TERM GOAL #1   Title  squat wihtout pain > 3/10    Status  On-going      PT LONG TERM GOAL #2   Title  go up and down stairs step over step without pain > 3/10    Status  Partially Met      PT LONG TERM GOAL #3   Title  increase AROM to 0-120 degrees bilaterally    Baseline  0-120 BIL with pain with flexion    Status  Achieved      PT LONG TERM GOAL #4   Title  increase strength to 4+/5    Status  Achieved            Plan - 08/14/17 0926    Clinical Impression Statement  ROM and MMT goals met. educ in gym transition and self care with ADLs at home ot avoid injury.     PT Treatment/Interventions  ADLs/Self Care Home Management;Iontophoresis 60m/ml Dexamethasone;Cryotherapy;Electrical Stimulation;Moist Heat;Gait training;Neuromuscular re-education;Balance  training;Therapeutic exercise;Therapeutic activities;Functional mobility training;Stair training;Patient/family education;Manual techniques;Vasopneumatic Device    PT Next Visit Plan  follow up in 2 week sto assure okay with gym transition       Patient will benefit from skilled therapeutic intervention in order to improve the following deficits and impairments:  Abnormal gait, Decreased range of motion, Difficulty walking, Pain, Impaired flexibility, Decreased strength  Visit Diagnosis: Acute pain of left knee  Acute pain of right knee  Stiffness of left knee, not elsewhere classified  Difficulty in walking, not elsewhere classified     Problem List Patient Active Problem List   Diagnosis Date Noted  . Elevated LDL cholesterol level 06/17/2017  . Acquired hypothyroidism 06/17/2017  . Post-menopausal 06/17/2017  . Cough 05/30/2017  . Suprapubic pain 05/30/2017  . Patellofemoral syndrome of both knees 12/19/2016  . Allergic dermatitis 09/16/2015  . Plantar fasciitis, right 07/27/2014  . Loss of transverse plantar arch 07/27/2014  . Pharyngitis 06/21/2014  . 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    Class: Present on Admission  . Endometrial polyp 06/06/2014    Class: Present on Admission  . Sinusitis, acute frontal 06/03/2014  . IBS (irritable bowel syndrome) 03/08/2014   PHYSICAL THERAPY DISCHARGE SUMMARY  Visits from Start of Care: 5 Plan: Patient agrees to discharge.  Patient goals were partially met. Patient is being discharged due to financial reasons.  ?????      Elizabeth Meyer PTA 08/14/2017, 9:28 AM  [address] [address]Phone: [phone]  Fax:  [phone] Name: JGERDA YINMRN: [id number] of Birth: [birth year]

## 2017-08-18 ENCOUNTER — Ambulatory Visit: Payer: 59 | Admitting: Family Medicine

## 2017-08-26 ENCOUNTER — Other Ambulatory Visit: Payer: Self-pay | Admitting: Family Medicine

## 2017-08-26 DIAGNOSIS — R059 Cough, unspecified: Secondary | ICD-10-CM

## 2017-08-26 DIAGNOSIS — R05 Cough: Secondary | ICD-10-CM

## 2017-08-27 ENCOUNTER — Ambulatory Visit: Payer: 59 | Admitting: Physical Therapy

## 2017-09-03 ENCOUNTER — Other Ambulatory Visit: Payer: Self-pay | Admitting: Family Medicine

## 2017-09-03 NOTE — Telephone Encounter (Signed)
Refill done.  

## 2017-09-09 ENCOUNTER — Other Ambulatory Visit (INDEPENDENT_AMBULATORY_CARE_PROVIDER_SITE_OTHER): Payer: 59

## 2017-09-09 ENCOUNTER — Ambulatory Visit (INDEPENDENT_AMBULATORY_CARE_PROVIDER_SITE_OTHER)
Admission: RE | Admit: 2017-09-09 | Discharge: 2017-09-09 | Disposition: A | Discharge status: Home / Self Care | Payer: 59 | Source: Ambulatory Visit | Attending: Family Medicine | Admitting: Family Medicine

## 2017-09-09 ENCOUNTER — Encounter: Payer: Self-pay | Admitting: Family Medicine

## 2017-09-09 ENCOUNTER — Ambulatory Visit (INDEPENDENT_AMBULATORY_CARE_PROVIDER_SITE_OTHER): Payer: 59 | Admitting: Family Medicine

## 2017-09-09 VITALS — BP 112/82 | HR 76 | Ht 62.0 in | Wt 149.0 lb

## 2017-09-09 DIAGNOSIS — M545 Low back pain, unspecified: Secondary | ICD-10-CM

## 2017-09-09 DIAGNOSIS — M2550 Pain in unspecified joint: Secondary | ICD-10-CM | POA: Diagnosis not present

## 2017-09-09 DIAGNOSIS — M222X2 Patellofemoral disorders, left knee: Secondary | ICD-10-CM | POA: Diagnosis not present

## 2017-09-09 DIAGNOSIS — G8929 Other chronic pain: Secondary | ICD-10-CM

## 2017-09-09 DIAGNOSIS — M222X1 Patellofemoral disorders, right knee: Secondary | ICD-10-CM

## 2017-09-09 HISTORY — DX: Low back pain, unspecified: M54.50

## 2017-09-09 LAB — COMPREHENSIVE METABOLIC PANEL
ALBUMIN: 4.3 g/dL (ref 3.5–5.2)
ALK PHOS: 118 U/L — AB (ref 39–117)
ALT: 15 U/L (ref 0–35)
AST: 14 U/L (ref 0–37)
BILIRUBIN TOTAL: 0.3 mg/dL (ref 0.2–1.2)
BUN: 16 mg/dL (ref 6–23)
CO2: 29 mEq/L (ref 19–32)
Calcium: 10.2 mg/dL (ref 8.4–10.5)
Chloride: 105 mEq/L (ref 96–112)
Creatinine, Ser: 0.71 mg/dL (ref 0.40–1.20)
GFR: 90.96 mL/min (ref >60.00)
GLUCOSE: 101 mg/dL — AB (ref 70–99)
Potassium: 4 mEq/L (ref 3.5–5.1)
SODIUM: 141 meq/L (ref 135–145)
Total Protein: 6.8 g/dL (ref 6.0–8.3)

## 2017-09-09 LAB — CBC WITH DIFFERENTIAL/PLATELET
BASOS ABS: 0.1 10*3/uL (ref 0.0–0.1)
Basophils Relative: 0.9 % (ref 0.0–3.0)
EOS ABS: 0.2 10*3/uL (ref 0.0–0.7)
Eosinophils Relative: 2.3 % (ref 0.0–5.0)
HCT: 37.5 % (ref 36.0–46.0)
Hemoglobin: 13.1 g/dL (ref 12.0–15.0)
LYMPHS ABS: 1.7 10*3/uL (ref 0.7–4.0)
Lymphocytes Relative: 24.9 % (ref 12.0–46.0)
MCHC: 34.9 g/dL (ref 30.0–36.0)
MCV: 90.3 fl (ref 78.0–100.0)
MONO ABS: 0.6 10*3/uL (ref 0.1–1.0)
Monocytes Relative: 9.3 % (ref 3.0–12.0)
NEUTROS PCT: 62.6 % (ref 43.0–77.0)
Neutro Abs: 4.2 10*3/uL (ref 1.4–7.7)
Platelets: 325 10*3/uL (ref 150.0–400.0)
RBC: 4.15 Mil/uL (ref 3.87–5.11)
RDW: 12.6 % (ref 11.5–15.5)
WBC: 6.7 10*3/uL (ref 4.0–10.5)

## 2017-09-09 LAB — URIC ACID: Uric Acid, Serum: 4.7 mg/dL (ref 2.4–7.0)

## 2017-09-09 LAB — IBC PANEL
Iron: 85 ug/dL (ref 42–145)
Saturation Ratios: 20.9 % (ref 20.0–50.0)
Transferrin: 290 mg/dL (ref 212.0–360.0)

## 2017-09-09 LAB — VITAMIN D 25 HYDROXY (VIT D DEFICIENCY, FRACTURES): VITD: 34.95 ng/mL (ref 30.00–100.00)

## 2017-09-09 LAB — TSH: TSH: 0.41 u[IU]/mL (ref 0.35–4.50)

## 2017-09-09 LAB — SEDIMENTATION RATE: Sed Rate: 5 mm/hr (ref 0–30)

## 2017-09-09 LAB — C-REACTIVE PROTEIN: CRP: 0.1 mg/dL — AB (ref 0.5–20.0)

## 2017-09-09 NOTE — Assessment & Plan Note (Signed)
Bilateral low back pain.  Discussed icing regimen and home exercise.  Discussed which activities to do which wants to avoid.  Increase activity as tolerated.  X-rays ordered today.  Laboratory work-up due to pain being out of proportion to the amount of palpation today.

## 2017-09-09 NOTE — Patient Instructions (Signed)
Good to see you  Injected the knees today  Ice is your friend.  LAbs ordered today  Xray today as well  See me again in 4 weeks just in case.

## 2017-09-09 NOTE — Assessment & Plan Note (Signed)
Patient does have bilateral knee pain.  Given injections again today.  Patient's x-rays seem to be fairly unremarkable except for the patellofemoral joint.  Pain seems to be out of proportion and will send to the laboratory work-up for polyarthralgia.  Discussed icing regimen and home exercise.  Discussed topical anti-inflammatories.  Follow-up again in 4 weeks

## 2017-09-09 NOTE — Progress Notes (Signed)
[name] [place], [place] [address] Phone: [phone] Subjective:      CC: Bilateral knee pain, back pain  STM:[id number]  Elizabeth Meyer is a 57 y.o. female coming in with complaint of bilateral knee pain. Did do some physical therapy which helped minimally. Still is unable to squat. Pain with being on her feet for prolonged periods. Does still do HEP at home.   Patient also notes that she back and hip pain on the right side which has seemed to increase as she feels that she is compensating.  Patient states that the pain has been severe enough to stop her from activities from time to time including something with the family.  Denies any radiation.     Past Medical History:  Diagnosis Date  . Complex cyst of left ovary   . Endometrial polyp   . Heart murmur Dr Johnsie Cancel   dx 2012--  Benign--  per echo abnormal mitral inflow  . History of gastrointestinal ulcer   . IBS (irritable bowel syndrome)   . Seasonal allergies   . Wears contact lenses    Past Surgical History:  Procedure Laterality Date  . CARPAL TUNNEL RELEASE Left 05/15/2017   Procedure: LEFT CARPAL TUNNEL RELEASE;  Surgeon: [name], MD;  Location: [facility];  Service: Orthopedics;  Laterality: Left;  . COLONOSCOPY WITH PROPOFOL  last one 04-28-2014  . CYSTOSCOPY N/A 06/06/2014   Procedure: CYSTOSCOPY;  Surgeon: [name], MD;  Location: [facility];  Service: Gynecology;  Laterality: N/A;  . DILATATION & CURRETTAGE/HYSTEROSCOPY WITH RESECTOCOPE N/A 06/06/2014   Procedure: DILATATION & CURETTAGE/HYSTEROSCOPY WITH RESECTOCOPE;  Surgeon: [name], MD;  Location: [place];  Service: Gynecology;  Laterality: N/A;  . LAPAROSCOPIC SALPINGO OOPHERECTOMY Left 06/06/2014   Procedure: LAPAROSCOPIC LEFT SALPINGO OOPHORECTOMY;  Surgeon: [name], MD;  Location: [facility];  Service: Gynecology;  Laterality:  Left;  . PLANTAR FASCIA SURGERY Left 2009   topaz procedure  . TRANSTHORACIC ECHOCARDIOGRAM  08-12-2007   normal LV,  ef 65-70%,  abnormal mitral inflow with trivial MR,  trivial PR and TR   Social History   Socioeconomic History  . Marital status: Married    Spouse name: Not on file  . Number of children: 2  . Years of education: Not on file  . Highest education level: Not on file  Occupational History  . Occupation: Agricultural consultant: [employer]  Social Needs  . Financial resource strain: Not on file  . Food insecurity:    Worry: Not on file    Inability: Not on file  . Transportation needs:    Medical: Not on file    Non-medical: Not on file  Tobacco Use  . Smoking status: Never Smoker  . Smokeless tobacco: Never Used  Substance and Sexual Activity  . Alcohol use: No  . Drug use: No  . Sexual activity: Not on file  Lifestyle  . Physical activity:    Days per week: Not on file    Minutes per session: Not on file  . Stress: Not on file  Relationships  . Social connections:    Talks on phone: Not on file    Gets together: Not on file    Attends religious service: Not on file    Active member of club or organization: Not on file    Attends meetings of clubs or organizations: Not  on file    Relationship status: Not on file  Other Topics Concern  . Not on file  Social History Narrative   Married, Aeronautical engineer 2 daughters   1-2 caffeinated beverages/day   No Known Allergies Family History  Problem Relation Age of Onset  . Glaucoma Mother   . Hypertension Mother   . Hyperlipidemia Mother   . Heart disease Paternal Grandfather   . Tuberculosis Maternal Grandfather   . Diabetes Maternal Grandfather   . Dementia Paternal Grandmother   . Tuberculosis Unknown   . Colon cancer Neg Hx   . Stomach cancer Neg Hx      Past medical history, social, surgical and family history all reviewed in electronic medical record.  No pertanent information  unless stated regarding to the chief complaint.   Review of Systems:Review of systems updated and as accurate as of 09/09/17  No headache, visual changes, nausea, vomiting, diarrhea, constipation, dizziness, abdominal pain, skin rash, fevers, chills, night sweats, weight loss, swollen lymph nodes,joint swelling, chest pain, shortness of breath, mood changes.  Positive muscle aches, body aches  Objective  Blood pressure 112/82, pulse 76, height 5\' 2"  (1.575 m), weight 149 lb (67.6 kg), last menstrual period 03/30/2016, SpO2 98 %. Systems examined below as of 09/09/17   General: No apparent distress alert and oriented x3 mood and affect normal, dressed appropriately.  HEENT: Pupils equal, extraocular movements intact  Respiratory: Patient's speak in full sentences and does not appear short of breath  Cardiovascular: No lower extremity edema, non tender, no erythema  Skin: Warm dry intact with no signs of infection or rash on extremities or on axial skeleton.  Abdomen: Soft nontender  Neuro: Cranial nerves II through XII are intact, neurovascularly intact in all extremities with 2+ DTRs and 2+ pulses.  Lymph: No lymphadenopathy of posterior or anterior cervical chain or axillae bilaterally.  Gait mild antalgic MSK:  tender with full range of motion and good stability and symmetric strength and tone of shoulders, elbows, wrist, hip, and ankles bilaterally.  Knee exam bilaterally still shows a positive patella grind.  Patient has some mild lateral tracking of the patella bilaterally.  Otherwise knee seems to be fairly unremarkable.  Full range of motion and full strength  Back exam shows loss of lordosis.  Tightness of the [facility] test bilaterally.  Negative straight leg test but tightness of the hamstrings bilaterally.  Discussed icing regimen    Impression and Recommendations:     This case required medical decision making of moderate complexity.      Note: This dictation was prepared  with Dragon dictation along with smaller phrase technology. Any transcriptional errors that result from this process are unintentional.

## 2017-09-11 LAB — PTH, INTACT AND CALCIUM
Calcium: 10.6 mg/dL — ABNORMAL HIGH (ref 8.6–10.4)
PTH: 121 pg/mL — ABNORMAL HIGH (ref 14–64)

## 2017-09-11 LAB — ANGIOTENSIN CONVERTING ENZYME: Angiotensin-Converting Enzyme: 19 U/L (ref 9–67)

## 2017-09-11 LAB — RHEUMATOID FACTOR: Rhuematoid fact SerPl-aCnc: <14 IU/mL (ref <14)

## 2017-09-11 LAB — ANA: Anti Nuclear Antibody(ANA): NEGATIVE

## 2017-09-11 LAB — CYCLIC CITRUL PEPTIDE ANTIBODY, IGG

## 2017-09-11 LAB — CALCIUM, IONIZED: CALCIUM ION: 5.8 mg/dL — AB (ref 4.8–5.6)

## 2017-09-15 ENCOUNTER — Encounter: Payer: Self-pay | Admitting: Family Medicine

## 2017-10-07 ENCOUNTER — Ambulatory Visit (INDEPENDENT_AMBULATORY_CARE_PROVIDER_SITE_OTHER): Payer: 59 | Admitting: Internal Medicine

## 2017-10-07 ENCOUNTER — Encounter: Payer: Self-pay | Admitting: Internal Medicine

## 2017-10-07 DIAGNOSIS — E213 Hyperparathyroidism, unspecified: Secondary | ICD-10-CM | POA: Diagnosis not present

## 2017-10-07 LAB — MAGNESIUM: MAGNESIUM: 2.2 mg/dL (ref 1.5–2.5)

## 2017-10-07 LAB — PHOSPHORUS: PHOSPHORUS: 2.9 mg/dL (ref 2.3–4.6)

## 2017-10-07 NOTE — Patient Instructions (Addendum)
Please stop at the lab.  Please come back for a follow-up appointment in 6 months.  Please collect a 24h urine for calcium: Patient information (Up-to-Date): Collection of a 24-hour urine specimen  - You should collect every drop of urine during each 24-hour period. It does not matter how much or little urine is passed each time, as long as every drop is collected. - Begin the urine collection in the morning after you wake up, after you have emptied your bladder for the first time. - Urinate (empty the bladder) for the first time and flush it down the toilet. Note the exact time (eg, 6:15 AM). You will begin the urine collection at this time. - Collect every drop of urine during the day and night in an empty collection bottle. Store the bottle at room temperature or in the refrigerator. - If you need to have a bowel movement, any urine passed with the bowel movement should be collected. Try not to include feces with the urine collection. If feces does get mixed in, do not try to remove the feces from the urine collection bottle. - Finish by collecting the first urine passed the next morning, adding it to the collection bottle. This should be within ten minutes before or after the time of the first morning void on the first day (which was flushed). In this example, you would try to void between 6:05 and 6:25 on the second day. - If you need to urinate one hour before the final collection time, drink a full glass of water so that you can void again at the appropriate time. If you have to urinate 20 minutes before, try to hold the urine until the proper time. - Please note the exact time of the final collection, even if it is not the same time as when collection began on day 1. - The bottle(s) may be kept at room temperature for a day or two, but should be kept cool or refrigerated for longer periods of time.  Please come back for a follow-up appointment in 6 months.  Hypercalcemia Hypercalcemia is  having too much calcium in the blood. The body needs calcium to make bones and keep them strong. Calcium also helps the muscles, nerves, brain, and heart work the way they should. Most of the calcium in the body is in the bones. There is also some calcium in the blood. Hypercalcemia can happen when calcium comes out of the bones, or when the kidneys are not able to remove calcium from the blood. Hypercalcemia can be mild or severe. What are the causes? There are many possible causes of hypercalcemia. Common causes include:  Hyperparathyroidism. This is a condition in which the body produces too much parathyroid hormone. There are four parathyroid glands in your neck. These glands produce a chemical messenger (hormone) that helps the body absorb calcium from foods and helps your bones release calcium.  Certain kinds of cancer, such as lung cancer, breast cancer, or myeloma.  Less common causes of hypercalcemia include:  Getting too much calcium or vitamin D from your diet.  Kidney failure.  Hyperthyroidism.  Being on bed rest for a long time.  Certain medicines.  Infections.  Sarcoidosis.  What increases the risk? This condition is more likely to develop in:  Women.  People who are 60 years or older.  People who have a family history of hypercalcemia.  What are the signs or symptoms? Mild hypercalcemia that starts slowly may not cause symptoms. Severe, sudden  hypercalcemia is more likely to cause symptoms, such as:  Loss of appetite.  Increased thirst and frequent urination.  Fatigue.  Nausea and vomiting.  Headache.  Abdominal pain.  Muscle pain, twitching, or weakness.  Constipation.  Blood in the urine.  Pain in the side of the back (flank pain).  Anxiety, confusion, or depression.  Irregular heartbeat (arrhythmia).  Loss of consciousness.  How is this diagnosed? This condition may be diagnosed based on:  Your symptoms.  Blood tests.  Urine  tests.  X-rays.  Ultrasound.  MRI.  CT scan.  How is this treated? Treatment for hypercalcemia depends on the cause. Treatment may include:  Receiving fluids through an IV tube.  Medicines that keep calcium levels steady after receiving fluids (loop diuretics).  Medicines that keep calcium in your bones (bisphosphonates).  Medicines that lower the calcium level in your blood.  Surgery to remove overactive parathyroid glands.  Follow these instructions at home:  Take over-the-counter and prescription medicines only as told by your health care provider.  Follow instructions from your health care provider about eating or drinking restrictions.  Drink enough fluid to keep your urine clear or pale yellow.  Stay active. Weight-bearing exercise helps to keep calcium in your bones. Follow instructions from your health care provider about what type and level of exercise is safe for you.  Keep all follow-up visits as told by your health care provider. This is important. Contact a health care provider if:  You have a fever.  You have flank or abdominal pain that is getting worse. Get help right away if:  You have severe abdominal or flank pain.  You have chest pain.  You have trouble breathing.  You become very confused and sleepy.  You lose consciousness. This information is not intended to replace advice given to you by your health care provider. Make sure you discuss any questions you have with your health care provider. Document Released: 06/08/2004 Document Revised: 08/31/2015 Document Reviewed: 08/10/2014 Elsevier Interactive Patient Education  2018 [employer].

## 2017-10-07 NOTE — Progress Notes (Addendum)
Patient ID: DESERIE DIRKS, female   DOB: December 11, 1960, 57 y.o.   MRN: [id number]    HPI  FARHIYA ROSTEN is a 57 y.o.-year-old female, referred by Dr Tamala Julian, for evaluation for hypercalcemia/hyperparathyroidism.  Patient was found to have an elevated calcium during investigation by Dr. Tamala Julian, whom she sees for joint pain (hips, knees).  A PTH level was also found to be high.  She was referred to endocrinology for further investigation.  I reviewed pt's pertinent labs: Lab Results  Component Value Date   PTH 121 (H) 09/09/2017   CALCIUM 10.6 (H) 09/09/2017   CALCIUM 10.2 09/09/2017   No history of osteoporosis.  No fractures or falls.   No h/o kidney stones.  No h/o CKD. Last BUN/Cr: Lab Results  Component Value Date   BUN 16 09/09/2017   CREATININE 0.71 09/09/2017   Pt is not on HCTZ.  + h/o vitamin D deficiency. Reviewed vit D levels: Lab Results  Component Value Date   VD25OH 34.95 09/09/2017   Pt is not on calcium but takes 50,000 units ergocalciferol per week - for several months.  Recent TSH was normal.  Pt does not have a FH of hypercalcemia, pituitary tumors, thyroid cancer, or osteoporosis.  Her daughter and her mother  have had kidney stones.   Pt. also has a history of carpal tunnel surgeries bilaterally.   ROS: Constitutional: no weight gain/loss, no fatigue, + poor sleep, + hot flashes, + nocturia Eyes: no blurry vision, no xerophthalmia ENT: no sore throat, no nodules palpated in throat, no dysphagia/odynophagia, no hoarseness Cardiovascular: no CP/SOB/palpitations/leg swelling Respiratory: no cough/SOB Gastrointestinal: no N/V/D/+ C Musculoskeletal: + Both muscle/joint aches Skin: no rashes Neurological: no tremors/numbness/tingling/dizziness Psychiatric: no depression/anxiety  Past Medical History:  Diagnosis Date  . Complex cyst of left ovary   . Endometrial polyp   . Heart murmur Dr Johnsie Cancel   dx 2012--  Benign--  per echo abnormal mitral inflow   . History of gastrointestinal ulcer   . IBS (irritable bowel syndrome)   . Seasonal allergies   . Wears contact lenses    Past Surgical History:  Procedure Laterality Date  . CARPAL TUNNEL RELEASE Left 05/15/2017   Procedure: LEFT CARPAL TUNNEL RELEASE;  Surgeon: [name], MD;  Location: [facility];  Service: Orthopedics;  Laterality: Left;  . COLONOSCOPY WITH PROPOFOL  last one 04-28-2014  . CYSTOSCOPY N/A 06/06/2014   Procedure: CYSTOSCOPY;  Surgeon: [name], MD;  Location[facility];  Service: Gynecology;  Laterality: N/A;  . DILATATION & CURRETTAGE/HYSTEROSCOPY WITH RESECTOCOPE N/A 06/06/2014   Procedure: DILATATION & CURETTAGE/HYSTEROSCOPY WITH RESECTOCOPE;  Surgeon: [name], MD;  Location: [place];  Service: Gynecology;  Laterality: N/A;  . LAPAROSCOPIC SALPINGO OOPHERECTOMY Left 06/06/2014   Procedure: LAPAROSCOPIC LEFT SALPINGO OOPHORECTOMY;  Surgeon: [name], MD;  Location: [place] [facility];  Service: Gynecology;  Laterality: Left;  . PLANTAR FASCIA SURGERY Left 2009   topaz procedure  . TRANSTHORACIC ECHOCARDIOGRAM  08-12-2007   normal LV,  ef 65-70%,  abnormal mitral inflow with trivial MR,  trivial PR and TR   Social History   Socioeconomic History  . Marital status: Married    Spouse name: Not on file  . Number of children: 2  . Years of education: Not on file  . Highest education level: Not on file  Occupational History  . Occupation: Agricultural consultant: [employer]  Social Needs  .  Financial resource strain: Not on file  . Food insecurity:    Worry: Not on file    Inability: Not on file  . Transportation needs:    Medical: Not on file    Non-medical: Not on file  Tobacco Use  . Smoking status: Never Smoker  . Smokeless tobacco: Never Used  Substance and Sexual Activity  . Alcohol use: No  . Drug use: No  . Sexual activity: Not on file  Lifestyle  . Physical activity:     Days per week: Not on file    Minutes per session: Not on file  . Stress: Not on file  Relationships  . Social connections:    Talks on phone: Not on file    Gets together: Not on file    Attends religious service: Not on file    Active member of club or organization: Not on file    Attends meetings of clubs or organizations: Not on file    Relationship status: Not on file  . Intimate partner violence:    Fear of current or ex partner: Not on file    Emotionally abused: Not on file    Physically abused: Not on file    Forced sexual activity: Not on file  Other Topics Concern  . Not on file  Social History Narrative   Married, Aeronautical engineer 2 daughters   1-2 caffeinated beverages/day   Current Outpatient Medications on File Prior to Visit  Medication Sig Dispense Refill  . cetirizine (ZYRTEC) 10 MG tablet Take 10 mg by mouth as needed for allergies.    . Diclofenac Sodium (PENNSAID) 2 % SOLN Place 2 application onto the skin 2 (two) times daily. 112 g 3  . fexofenadine (ALLEGRA) 180 MG tablet Take 180 mg by mouth daily.    . fluticasone (FLONASE) 50 MCG/ACT nasal spray Place 2 sprays into both nostrils daily. 16 g 0  . Ibuprofen 200 MG CAPS Take by mouth as needed.    . pseudoephedrine (SUDAFED) 120 MG 12 hr tablet Take 120 mg by mouth every 12 (twelve) hours as needed for congestion.    . ranitidine (ZANTAC) 150 MG tablet TAKE 1 TABLET BY MOUTH TWICE A DAY 60 tablet 2  . traZODone (DESYREL) 50 MG tablet Take 0.5-1 tablets (25-50 mg total) by mouth at bedtime as needed for sleep. 90 tablet 0  . Vitamin D, Ergocalciferol, (DRISDOL) 50000 units CAPS capsule TAKE 1 CAPSULE BY MOUTH EVERY 7 DAYS. 12 capsule 0  . SUMAtriptan (IMITREX) 100 MG tablet TAKE 1 TABLET AT ONSET OF MIGRAINE HEADACHE. MAY REPEAT IN 2 HOURS IF NEEDED.  1   No current facility-administered medications on file prior to visit.    No Known Allergies Family History  Problem Relation Age of Onset  .  Glaucoma Mother   . Hypertension Mother   . Hyperlipidemia Mother   . Heart disease Paternal Grandfather   . Tuberculosis Maternal Grandfather   . Diabetes Maternal Grandfather   . Dementia Paternal Grandmother   . Tuberculosis Unknown   . Colon cancer Neg Hx   . Stomach cancer Neg Hx     PE: BP 110/60   Pulse 89   Ht 5\' 2"  (1.575 m)   Wt 147 lb 9.6 oz (67 kg)   LMP 03/30/2016   SpO2 98%   BMI 27.00 kg/m  Wt Readings from Last 3 Encounters:  10/07/17 147 lb 9.6 oz (67 kg)  09/09/17 149 lb (67.6 kg)  06/17/17 149  lb (67.6 kg)   Constitutional: Slightly overweight, in NAD. No kyphosis. Eyes: PERRLA, EOMI, no exophthalmos ENT: moist mucous membranes, no thyromegaly, no cervical lymphadenopathy Cardiovascular: RRR, No MRG Respiratory: CTA B Gastrointestinal: abdomen soft, NT, ND, BS+ Musculoskeletal: no deformities, strength intact in all 4 Skin: moist, warm, no rashes Neurological: no tremor with outstretched hands, DTR normal in all 4  Assessment: 1. Hypercalcemia/hyperparathyroidism  Plan: Patient was found to have an elevated calcium, at 10.6. A corresponding intact PTH level was also high, at 121.  - Patient also  has vitamin D deficiency, and is on replacement with ergocalciferol weekly, however, her latest level was normal: 34.95.  - No apparent complications from hypercalcemia: no h/o nephrolithiasis, no osteoporosis, no fractures. No abdominal pain, depression, bone pain.  She does have joint pain for which she is seeing Dr. Tamala Julian, as mentioned above. - I discussed with the patient about the physiology of calcium and parathyroid hormone, and possible side effects from increased PTH, including kidney stones, osteoporosis, abdominal pain, etc.  - We discussed that we need to check whether her hyperparathyroidism is primary (Familial hypercalcemic hypocalciuria or parathyroid adenoma) or secondary (to conditions like: vitamin D deficiency, calcium malabsorption,  hypercalciuria, renal insufficiency, etc.). - At today's visit, we will check: calcium level intact PTH ([facility]) Magnesium Phosphorus vitamin D 1,25 HO 24h urinary calcium/creatinine ratio - given instructions for urine collection - We discussed possible consequences of hyperparathyroidism: ~1/3 pts will develop complications over 15 years (OP, nephrolithiasis).  - If the tests indicate a parathyroid adenoma, she agrees with a referral to surgery.  - criteria for parathyroid surgery:  Increased calcium by more than 1 mg/dL above the upper limit of normal  Kidney ds.  Osteoporosis (or Vb fx) Age <29 years old Newer criteria (2013): High UCa >400 mg/d and increased stone risk by biochemical stone risk analysis Presence of nephrolithiasis or nephrocalcinosis Pt's preference!  - We may need to check a DXA scan to see if she has osteoporosis (+ add a 33% distal radius for evaluation of cortical bone, which is predominantly affected by hyperparathyroidism).  - I will see the patient back in 6 months  Component     Latest Ref Rng & Units 10/07/2017          Glucose     65 - 99 mg/dL 114 (H)  BUN     7 - 25 mg/dL 21  Creatinine     0.50 - 1.05 mg/dL 0.85  GFR, Est Non African American     > OR = 60 mL/min/1.60m2 78  GFR, Est African American     > OR = 60 mL/min/1.57m2 90  Sodium     135 - 146 mmol/L 140  Potassium     3.5 - 5.3 mmol/L 3.7  Chloride     98 - 110 mmol/L 106  CO2     20 - 32 mmol/L 27  Calcium     8.6 - 10.4 mg/dL 10.0  Vitamin D 1, 25 (OH) Total     18 - 72 pg/mL 84 (H)  Vitamin D3 1, 25 (OH)     pg/mL 41  Vitamin D2 1, 25 (OH)     pg/mL 43  PTH, Intact     15 - 65 pg/mL 98 (H)  Magnesium     1.5 - 2.5 mg/dL 2.2  Phosphorus     2.3 - 4.6 mg/dL 2.9   PTH high with normal calcium but high calcitriol. Mg and phos  normal. Would suggest to go ahead and collect a 24h urine for calcium.   Received and reviewed DXA scan report and images: 07/31/2017:   Lumbar spine L1-L4 (L2) Femoral neck (FN) 33% distal radius frax  07/31/2017 -2.4 (-10.7%*) RFN: -1.3 LFN: -0.8 n/a 10-year fracture risk 5.9% 10-year hip fracture risk: 0.3%  02/24/2014 -1.5  n/a   *Statistically significant difference  The DEXA report is not clear in comparison between the hip bone mineral densities between 2019 and 2015.  It looks that the bone density have decreased but I cannot tell whether it is the total hip BMD's that were compared.  We will scan the report.  Component     Latest Ref Rng & Units 10/15/2017  Creatinine, 24H Ur     0.50 - 2.15 g/24 h 0.78  Calcium, 24H Urine     mg/24 h 198   Normal urinary calcium.  At this point, based on the decreased (and decreasing) bone density and the elevated PTH, I would suggest a referral to surgery.  Philemon Kingdom, MD PhD [facility] Endocr[facility]

## 2017-10-08 LAB — PARATHYROID HORMONE, INTACT (NO CA): PTH: 98 pg/mL — AB (ref 15–65)

## 2017-10-10 LAB — BASIC METABOLIC PANEL WITH GFR
BUN: 21 mg/dL (ref 7–25)
CO2: 27 mmol/L (ref 20–32)
Calcium: 10 mg/dL (ref 8.6–10.4)
Chloride: 106 mmol/L (ref 98–110)
Creat: 0.85 mg/dL (ref 0.50–1.05)
GFR, EST AFRICAN AMERICAN: 90 mL/min/{1.73_m2} (ref >=60)
GFR, EST NON AFRICAN AMERICAN: 78 mL/min/{1.73_m2} (ref >=60)
Glucose, Bld: 114 mg/dL — ABNORMAL HIGH (ref 65–99)
POTASSIUM: 3.7 mmol/L (ref 3.5–5.3)
SODIUM: 140 mmol/L (ref 135–146)

## 2017-10-10 LAB — VITAMIN D 1,25 DIHYDROXY
VITAMIN D 1, 25 (OH) TOTAL: 84 pg/mL — AB (ref 18–72)
VITAMIN D3 1, 25 (OH): 41 pg/mL
Vitamin D2 1, 25 (OH)2: 43 pg/mL

## 2017-10-14 ENCOUNTER — Telehealth: Payer: Self-pay | Admitting: Emergency Medicine

## 2017-10-14 ENCOUNTER — Encounter: Payer: Self-pay | Admitting: Emergency Medicine

## 2017-10-14 NOTE — Telephone Encounter (Signed)
Patient is calling to see if a copy of her bone density has come in from another office. If no she will call them again to resend it. Thanks.

## 2017-10-14 NOTE — Telephone Encounter (Signed)
LM informing patient that we have not received Bone Density Report

## 2017-10-15 ENCOUNTER — Telehealth: Payer: Self-pay | Admitting: Internal Medicine

## 2017-10-15 ENCOUNTER — Other Ambulatory Visit: Payer: Self-pay

## 2017-10-15 ENCOUNTER — Ambulatory Visit: Payer: 59 | Admitting: Family Medicine

## 2017-10-15 NOTE — Telephone Encounter (Signed)
Please call patient re: bone density test faxed from Dr. Sherran Needs office yesterday. Patient's ph# [phone]

## 2017-10-16 ENCOUNTER — Encounter: Payer: Self-pay | Admitting: Internal Medicine

## 2017-10-16 LAB — EXTRA URINE SPECIMEN

## 2017-10-16 LAB — CREATININE, URINE, 24 HOUR: Creatinine, 24H Ur: 0.78 g/(24.h) (ref 0.50–2.15)

## 2017-10-16 LAB — CALCIUM, URINE, 24 HOUR: Calcium, 24H Urine: 198 mg/24 h

## 2017-10-16 NOTE — Addendum Note (Signed)
Addended by: Philemon Kingdom on: 10/16/2017 11:59 AM   Modules accepted: Orders

## 2017-10-16 NOTE — Telephone Encounter (Signed)
Spoke to pt to info that we did receive results

## 2017-10-21 ENCOUNTER — Other Ambulatory Visit: Payer: 59

## 2017-10-30 ENCOUNTER — Telehealth: Payer: Self-pay

## 2017-10-30 NOTE — Telephone Encounter (Signed)
LMTCB

## 2017-10-30 NOTE — Telephone Encounter (Signed)
-----   Message from Philemon Kingdom, MD sent at 10/30/2017  8:56 AM EDT ----- C,  Can you please call her - she did not read my last msg to her about the DXA scan. Thank you, C

## 2017-10-31 NOTE — Telephone Encounter (Signed)
Pt is aware and has no questions or concerns at this time  

## 2017-11-03 ENCOUNTER — Other Ambulatory Visit: Payer: Self-pay | Admitting: Family Medicine

## 2017-11-03 DIAGNOSIS — E210 Primary hyperparathyroidism: Secondary | ICD-10-CM | POA: Diagnosis not present

## 2017-11-04 ENCOUNTER — Other Ambulatory Visit (HOSPITAL_COMMUNITY): Payer: Self-pay | Admitting: Surgery

## 2017-11-04 DIAGNOSIS — E210 Primary hyperparathyroidism: Secondary | ICD-10-CM

## 2017-11-13 ENCOUNTER — Encounter (HOSPITAL_COMMUNITY)
Admission: RE | Admit: 2017-11-13 | Discharge: 2017-11-13 | Disposition: A | Discharge status: Home / Self Care | Payer: 59 | Source: Ambulatory Visit | Attending: Surgery | Admitting: Surgery

## 2017-11-13 ENCOUNTER — Ambulatory Visit (HOSPITAL_COMMUNITY)
Admission: RE | Admit: 2017-11-13 | Discharge: 2017-11-13 | Disposition: A | Discharge status: Home / Self Care | Payer: 59 | Source: Ambulatory Visit | Attending: Surgery | Admitting: Surgery

## 2017-11-13 DIAGNOSIS — E210 Primary hyperparathyroidism: Secondary | ICD-10-CM | POA: Insufficient documentation

## 2017-11-13 DIAGNOSIS — E213 Hyperparathyroidism, unspecified: Secondary | ICD-10-CM | POA: Diagnosis not present

## 2017-11-13 MED ORDER — TECHNETIUM TC 99M SESTAMIBI GENERIC - CARDIOLITE
10.0000 | Freq: Once | INTRAVENOUS | Status: AC | PRN
  Administered 2017-11-13: 10 via INTRAVENOUS

## 2017-11-27 ENCOUNTER — Ambulatory Visit: Payer: 59 | Admitting: Internal Medicine

## 2017-11-27 ENCOUNTER — Other Ambulatory Visit: Payer: Self-pay | Admitting: Surgery

## 2017-11-27 ENCOUNTER — Other Ambulatory Visit: Payer: Self-pay | Admitting: Family Medicine

## 2017-11-27 DIAGNOSIS — E210 Primary hyperparathyroidism: Secondary | ICD-10-CM

## 2017-12-04 ENCOUNTER — Ambulatory Visit
Admission: RE | Admit: 2017-12-04 | Discharge: 2017-12-04 | Disposition: A | Discharge status: Home / Self Care | Payer: 59 | Source: Ambulatory Visit | Attending: Surgery | Admitting: Surgery

## 2017-12-04 DIAGNOSIS — E210 Primary hyperparathyroidism: Secondary | ICD-10-CM | POA: Diagnosis not present

## 2017-12-04 MED ORDER — IOPAMIDOL (ISOVUE-300) INJECTION 61%
75.0000 mL | Freq: Once | INTRAVENOUS | Status: AC | PRN
  Administered 2017-12-04: 75 mL via INTRAVENOUS

## 2017-12-18 ENCOUNTER — Ambulatory Visit: Payer: Self-pay | Admitting: Surgery

## 2017-12-19 ENCOUNTER — Other Ambulatory Visit: Payer: Self-pay | Admitting: Family Medicine

## 2017-12-22 NOTE — Telephone Encounter (Signed)
Refill done.  

## 2017-12-23 ENCOUNTER — Encounter (HOSPITAL_COMMUNITY): Payer: Self-pay | Admitting: *Deleted

## 2018-01-09 ENCOUNTER — Encounter (HOSPITAL_COMMUNITY)
Admission: RE | Admit: 2018-01-09 | Discharge: 2018-01-09 | Disposition: A | Discharge status: Home / Self Care | Payer: 59 | Source: Ambulatory Visit | Attending: Surgery | Admitting: Surgery

## 2018-01-09 ENCOUNTER — Encounter (HOSPITAL_COMMUNITY): Payer: Self-pay

## 2018-01-09 ENCOUNTER — Other Ambulatory Visit: Payer: Self-pay

## 2018-01-09 DIAGNOSIS — Z01812 Encounter for preprocedural laboratory examination: Secondary | ICD-10-CM | POA: Diagnosis not present

## 2018-01-09 LAB — CBC
HEMATOCRIT: 37.3 % (ref 36.0–46.0)
Hemoglobin: 12.4 g/dL (ref 12.0–15.0)
MCH: 30.2 pg (ref 26.0–34.0)
MCHC: 33.2 g/dL (ref 30.0–36.0)
MCV: 91 fL (ref 78.0–100.0)
Platelets: 329 10*3/uL (ref 150–400)
RBC: 4.1 MIL/uL (ref 3.87–5.11)
RDW: 12.2 % (ref 11.5–15.5)
WBC: 6.4 10*3/uL (ref 4.0–10.5)

## 2018-01-09 LAB — BASIC METABOLIC PANEL
ANION GAP: 6 (ref 5–15)
BUN: 15 mg/dL (ref 6–20)
CO2: 28 mmol/L (ref 22–32)
Calcium: 10 mg/dL (ref 8.9–10.3)
Chloride: 111 mmol/L (ref 98–111)
Creatinine, Ser: 0.64 mg/dL (ref 0.44–1.00)
GFR calc Af Amer: >60 mL/min (ref >60)
GFR calc non Af Amer: >60 mL/min (ref >60)
GLUCOSE: 95 mg/dL (ref 70–99)
POTASSIUM: 3.6 mmol/L (ref 3.5–5.1)
Sodium: 145 mmol/L (ref 135–145)

## 2018-01-09 NOTE — Patient Instructions (Addendum)
Elizabeth Meyer  01/09/2018   Your procedure is scheduled on:   01-13-2018  Report to [facility] Main  Entrance,  Report to admitting at  7:15 AM    Call this number if you have problems the morning of surgery [phone]    Remember: Do not eat food or drink liquids :After Midnight.                            BRUSH YOUR TEETH MORNING OF SURGERY AND RINSE YOUR MOUTH OUT, NO CHEWING GUM CANDY OR MINTS.     Take these medicines the morning of surgery with A SIP OF WATER:  Ranitidine (Zantac), Imitex if needed                                You may not have any metal on your body including hair pins and              piercings  Do not wear jewelry, make-up, lotions, powders or perfumes, deodorant             Do not wear nail polish.  Do not shave  48 hours prior to surgery.              Do not bring valuables to the hospital. [place].  Contacts, dentures or bridgework may not be worn into surgery.  Leave suitcase in the car. After surgery it may be brought to your room.     Patients discharged the day of surgery will not be allowed to drive home.  Name and phone number of your driver:   Husband-- Elizabeth Meyer # [phone]  Special Instructions: N/A              Please read over the following fact sheets you were given: _____________________________________________________________________             [facility] - Preparing for Surgery Before surgery, you can play an important role.  Because skin is not sterile, your skin needs to be as free of germs as possible.  You can reduce the number of germs on your skin by washing with CHG (chlorahexidine gluconate) soap before surgery.  CHG is an antiseptic cleaner which kills germs and bonds with the skin to continue killing germs even after washing. Please DO NOT use if you have an allergy to CHG or antibacterial soaps.  If your skin becomes reddened/irritated  stop using the CHG and inform your nurse when you arrive at Short Stay. Do not shave (including legs and underarms) for at least 48 hours prior to the first CHG shower.  You may shave your face/neck. Please follow these instructions carefully:  1.  Shower with CHG Soap the night before surgery and the  morning of Surgery.  2.  If you choose to wash your hair, wash your hair first as usual with your  normal  shampoo.  3.  After you shampoo, rinse your hair and body thoroughly to remove the  shampoo.                            4.  Use CHG as you  would any other liquid soap.  You can apply chg directly  to the skin and wash                       Gently with a scrungie or clean washcloth.  5.  Apply the CHG Soap to your body ONLY FROM THE NECK DOWN.   Do not use on face/ open                           Wound or open sores. Avoid contact with eyes, ears mouth and genitals (private parts).                       Wash face,  Genitals (private parts) with your normal soap.             6.  Wash thoroughly, paying special attention to the area where your surgery  will be performed.  7.  Thoroughly rinse your body with warm water from the neck down.  8.  DO NOT shower/wash with your normal soap after using and rinsing off  the CHG Soap.             9.  Pat yourself dry with a clean towel.            10.  Wear clean pajamas.            11.  Place clean sheets on your bed the night of your first shower and do not  sleep with pets. Day of Surgery : Do not apply any lotions/deodorants the morning of surgery.  Please wear clean clothes to the hospital/surgery center.  FAILURE TO FOLLOW THESE INSTRUCTIONS MAY RESULT IN THE CANCELLATION OF YOUR SURGERY PATIENT SIGNATURE_________________________________  NURSE SIGNATURE__________________________________  ________________________________________________________________________

## 2018-01-11 ENCOUNTER — Encounter (HOSPITAL_COMMUNITY): Payer: Self-pay | Admitting: Surgery

## 2018-01-11 DIAGNOSIS — E210 Primary hyperparathyroidism: Secondary | ICD-10-CM | POA: Diagnosis present

## 2018-01-11 HISTORY — DX: Primary hyperparathyroidism: E21.0

## 2018-01-11 NOTE — H&P (Signed)
General Surgery [facility]  Madilyn Fireman DOB: March 08, 1961 Married / Language: English / Race: White Female   History of Present Illness  The patient is a 57 year old female who presents with primary hyperparathyroidism.  CHIEF COMPLAINT: primary hyperparathyroidism  Patient is referred by Dr. Philemon Kingdom for surgical evaluation and management of suspected primary hyperparathyroidism. Patient's primary care physician is Dr. Roma Schanz. Patient was complaining of bilateral knee pain, bilateral hip pain, and lower back pain. As part of an evaluation she had laboratory studies which included calcium and PTH levels. Patient was found to have elevated calcium levels ranging from 10.2-10.6. Intact PTH level was elevated at 121. 25-hydroxy vitamin D level was normal at 35. 24-hour urine collection for calcium was normal at 198. Patient has had bone density scans which do not show evidence of osteopenia or osteoporosis. Patient denies any history of nephrolithiasis. She does complain of increased urinary frequency. She also notes mild fatigue. Patient has had no prior head or neck surgery. There are no family members with parathyroid disease or other endocrine neoplasms. Patient presents today accompanied by her husband to discuss further evaluation and management of suspected primary hyperparathyroidism.   Past Surgical History Foot Surgery  Left. Oral Surgery   Diagnostic Studies History  Colonoscopy  1-5 years ago Mammogram  within last year Pap Smear  1-5 years ago  Allergies No Known Drug Allergies [11/03/2017]: Allergies Reconciled   Medication History Zantac (150MG  Tablet, Oral) Active. ZyrTEC Allergy (10MG  Tablet, Oral) Active. Medications Reconciled  Social History  Alcohol use  Occasional alcohol use. Caffeine use  Carbonated beverages. No drug use  Tobacco use  Never smoker.  Family History  Arthritis   Mother. Heart Disease  Mother. Kidney Disease  Mother.  Pregnancy / Birth History Age at menarche  65 years. Age of menopause  51-55 Contraceptive History  Oral contraceptives. Gravida  2 Length (months) of breastfeeding  3-6 Maternal age  70-30 Para  2  Other Problems Hemorrhoids  Migraine Headache  Oophorectomy   Review of Systems General Not Present- Appetite Loss, Chills, Fatigue, Fever, Night Sweats, Weight Gain and Weight Loss. Skin Not Present- Change in Wart/Mole, Dryness, Hives, Jaundice, New Lesions, Non-Healing Wounds, Rash and Ulcer. HEENT Present- Seasonal Allergies and Wears glasses/contact lenses. Not Present- Earache, Hearing Loss, Hoarseness, Nose Bleed, Oral Ulcers, Ringing in the Ears, Sinus Pain, Sore Throat, Visual Disturbances and Yellow Eyes. Respiratory Not Present- Bloody sputum, Chronic Cough, Difficulty Breathing, Snoring and Wheezing. Breast Not Present- Breast Mass, Breast Pain, Nipple Discharge and Skin Changes. Cardiovascular Not Present- Chest Pain, Difficulty Breathing Lying Down, Leg Cramps, Palpitations, Rapid Heart Rate, Shortness of Breath and Swelling of Extremities. Gastrointestinal Not Present- Abdominal Pain, Bloating, Bloody Stool, Change in Bowel Habits, Chronic diarrhea, Constipation, Difficulty Swallowing, Excessive gas, Gets full quickly at meals, Hemorrhoids, Indigestion, Nausea, Rectal Pain and Vomiting. Female Genitourinary Present- Nocturia. Not Present- Frequency, Painful Urination, Pelvic Pain and Urgency. Musculoskeletal Present- Joint Pain. Not Present- Back Pain, Joint Stiffness, Muscle Pain, Muscle Weakness and Swelling of Extremities. Neurological Not Present- Decreased Memory, Fainting, Headaches, Numbness, Seizures, Tingling, Tremor, Trouble walking and Weakness. Psychiatric Not Present- Anxiety, Bipolar, Change in Sleep Pattern, Depression, Fearful and Frequent crying. Endocrine Present- Hot flashes. Not Present-  Cold Intolerance, Excessive Hunger, Hair Changes, Heat Intolerance and New Diabetes. Hematology Not Present- Blood Thinners, Easy Bruising, Excessive bleeding, Gland problems, HIV and Persistent Infections.  Vitals Weight: 150.4 lb Height: 62in Body Surface Area: 1.69 m  Body Mass Index: 27.51 kg/m  Temp.: 98.48F  Pulse: 95 (Regular)  BP: 122/84 (Sitting, Left Arm, Standard)   Physical Exam   See vital signs recorded above  GENERAL APPEARANCE Development: normal Nutritional status: normal Gross deformities: none  SKIN Rash, lesions, ulcers: none Induration, erythema: none Nodules: none palpable  EYES Conjunctiva and lids: normal Pupils: equal and reactive Iris: normal bilaterally  EARS, NOSE, MOUTH, THROAT External ears: no lesion or deformity External nose: no lesion or deformity Hearing: grossly normal Lips: no lesion or deformity Dentition: normal for age Oral mucosa: moist  NECK Symmetric: yes Trachea: midline Thyroid: no palpable nodules in the thyroid bed  CHEST Respiratory effort: normal Retraction or accessory muscle use: no Breath sounds: normal bilaterally Rales, rhonchi, wheeze: none  CARDIOVASCULAR Auscultation: regular rhythm, normal rate Murmurs: none Pulses: carotid and radial pulse 2+ palpable Lower extremity edema: none Lower extremity varicosities: none  MUSCULOSKELETAL Station and gait: normal Digits and nails: no clubbing or cyanosis Muscle strength: grossly normal all extremities Range of motion: grossly normal all extremities Deformity: none  LYMPHATIC Cervical: none palpable Supraclavicular: none palpable  PSYCHIATRIC Oriented to person, place, and time: yes Mood and affect: normal for situation Judgment and insight: appropriate for situation    Assessment & Plan   PRIMARY HYPERPARATHYROIDISM (E21.0)  Pt Education - Pamphlet Given - The Parathyroid Surgery Book: discussed with patient and provided  information.  Follow Up - Call [facility] office after tests / studies doneto discuss further plans  Patient presents on referral from Dr. Philemon Kingdom for surgical evaluation of primary hyperparathyroidism. She is accompanied by her husband. They are provided with written literature on parathyroid surgery to review at home.  Patient has biochemical evidence of hyperparathyroidism. She also has clinical symptoms. She has not had any imaging studies. We will request a ultrasound examination of the neck as well as a nuclear medicine parathyroid scan. We will contact the patient with the results of these studies when they are available. We discussed the possible need for additional studies including a 4D CT scan of the neck.  We discussed parathyroid surgery. We discussed the location of the surgical incision. We discussed performing this as an outpatient procedure. Patient understands and agrees to proceed with further evaluation for possible surgical intervention.  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  Plan minimally invasive surgery.  The risks and benefits of the procedure have been discussed at length with the patient.  The patient understands the proposed procedure, potential alternative treatments, and the course of recovery to be expected.  All of the patient's questions have been answered at this time.  The patient wishes to proceed with surgery.  Armandina Gemma, [facility] Office: [phone]

## 2018-01-13 ENCOUNTER — Encounter (HOSPITAL_COMMUNITY)
Admission: RE | Disposition: A | Discharge status: Home / Self Care | Payer: Self-pay | Source: Other Acute Inpatient Hospital | Attending: Surgery

## 2018-01-13 ENCOUNTER — Ambulatory Visit (HOSPITAL_COMMUNITY): Payer: 59 | Admitting: Certified Registered Nurse Anesthetist

## 2018-01-13 ENCOUNTER — Ambulatory Visit (HOSPITAL_COMMUNITY)
Admission: RE | Admit: 2018-01-13 | Discharge: 2018-01-13 | Disposition: A | Discharge status: Home / Self Care | Payer: 59 | Source: Other Acute Inpatient Hospital | Attending: Surgery | Admitting: Surgery

## 2018-01-13 ENCOUNTER — Encounter (HOSPITAL_COMMUNITY): Payer: Self-pay | Admitting: *Deleted

## 2018-01-13 DIAGNOSIS — Z8249 Family history of ischemic heart disease and other diseases of the circulatory system: Secondary | ICD-10-CM | POA: Insufficient documentation

## 2018-01-13 DIAGNOSIS — D351 Benign neoplasm of parathyroid gland: Secondary | ICD-10-CM | POA: Insufficient documentation

## 2018-01-13 DIAGNOSIS — E210 Primary hyperparathyroidism: Secondary | ICD-10-CM | POA: Diagnosis not present

## 2018-01-13 HISTORY — PX: PARATHYROIDECTOMY: SHX19

## 2018-01-13 SURGERY — PARATHYROIDECTOMY
Anesthesia: General | Site: Neck | Laterality: Right

## 2018-01-13 MED ORDER — ONDANSETRON HCL 4 MG/2ML IJ SOLN
INTRAMUSCULAR | Status: DC | PRN
  Administered 2018-01-13: 4 mg via INTRAVENOUS

## 2018-01-13 MED ORDER — OXYCODONE HCL 5 MG PO TABS
ORAL_TABLET | ORAL | Status: AC
  Filled 2018-01-13: qty 1

## 2018-01-13 MED ORDER — SUCCINYLCHOLINE CHLORIDE 200 MG/10ML IV SOSY
PREFILLED_SYRINGE | INTRAVENOUS | Status: AC
  Filled 2018-01-13: qty 10

## 2018-01-13 MED ORDER — FENTANYL CITRATE (PF) 100 MCG/2ML IJ SOLN
INTRAMUSCULAR | Status: AC
  Filled 2018-01-13: qty 2

## 2018-01-13 MED ORDER — PROPOFOL 10 MG/ML IV BOLUS
INTRAVENOUS | Status: AC
  Filled 2018-01-13: qty 20

## 2018-01-13 MED ORDER — ACETAMINOPHEN 10 MG/ML IV SOLN
INTRAVENOUS | Status: AC
  Filled 2018-01-13: qty 100

## 2018-01-13 MED ORDER — CHLORHEXIDINE GLUCONATE CLOTH 2 % EX PADS: 6.0000 | MEDICATED_PAD | Freq: Once | CUTANEOUS | Status: DC

## 2018-01-13 MED ORDER — SUGAMMADEX SODIUM 200 MG/2ML IV SOLN
INTRAVENOUS | Status: AC
  Filled 2018-01-13: qty 2

## 2018-01-13 MED ORDER — BUPIVACAINE HCL 0.25 % IJ SOLN
INTRAMUSCULAR | Status: DC | PRN
  Administered 2018-01-13: 30 mL

## 2018-01-13 MED ORDER — 0.9 % SODIUM CHLORIDE (POUR BTL) OPTIME
TOPICAL | Status: DC | PRN
  Administered 2018-01-13: 1000 mL

## 2018-01-13 MED ORDER — ROCURONIUM BROMIDE 100 MG/10ML IV SOLN
INTRAVENOUS | Status: AC
  Filled 2018-01-13: qty 1

## 2018-01-13 MED ORDER — ACETAMINOPHEN 10 MG/ML IV SOLN
1000.0000 mg | Freq: Once | INTRAVENOUS | Status: AC
  Administered 2018-01-13: 1000 mg via INTRAVENOUS

## 2018-01-13 MED ORDER — OXYCODONE HCL 5 MG PO TABS
5.0000 mg | ORAL_TABLET | Freq: Once | ORAL | Status: AC | PRN
  Administered 2018-01-13 (×2): 5 mg via ORAL

## 2018-01-13 MED ORDER — PROPOFOL 10 MG/ML IV BOLUS
INTRAVENOUS | Status: DC | PRN
  Administered 2018-01-13: 150 mg via INTRAVENOUS

## 2018-01-13 MED ORDER — PHENYLEPHRINE 40 MCG/ML (10ML) SYRINGE FOR IV PUSH (FOR BLOOD PRESSURE SUPPORT)
PREFILLED_SYRINGE | INTRAVENOUS | Status: DC | PRN
  Administered 2018-01-13 (×2): 40 ug via INTRAVENOUS

## 2018-01-13 MED ORDER — SUGAMMADEX SODIUM 200 MG/2ML IV SOLN
INTRAVENOUS | Status: DC | PRN
  Administered 2018-01-13: 200 mg via INTRAVENOUS

## 2018-01-13 MED ORDER — DEXAMETHASONE SODIUM PHOSPHATE 10 MG/ML IJ SOLN
INTRAMUSCULAR | Status: AC
  Filled 2018-01-13: qty 1

## 2018-01-13 MED ORDER — MIDAZOLAM HCL 5 MG/5ML IJ SOLN
INTRAMUSCULAR | Status: DC | PRN
  Administered 2018-01-13: 2 mg via INTRAVENOUS

## 2018-01-13 MED ORDER — PHENYLEPHRINE 40 MCG/ML (10ML) SYRINGE FOR IV PUSH (FOR BLOOD PRESSURE SUPPORT)
PREFILLED_SYRINGE | INTRAVENOUS | Status: AC
  Filled 2018-01-13: qty 10

## 2018-01-13 MED ORDER — ESMOLOL HCL 100 MG/10ML IV SOLN
INTRAVENOUS | Status: AC
  Filled 2018-01-13: qty 10

## 2018-01-13 MED ORDER — CEFAZOLIN SODIUM-DEXTROSE 2-4 GM/100ML-% IV SOLN
INTRAVENOUS | Status: AC
  Filled 2018-01-13: qty 100

## 2018-01-13 MED ORDER — DEXAMETHASONE SODIUM PHOSPHATE 10 MG/ML IJ SOLN
INTRAMUSCULAR | Status: DC | PRN
  Administered 2018-01-13: 10 mg via INTRAVENOUS

## 2018-01-13 MED ORDER — ROCURONIUM BROMIDE 50 MG/5ML IV SOSY
PREFILLED_SYRINGE | INTRAVENOUS | Status: DC | PRN
  Administered 2018-01-13: 40 mg via INTRAVENOUS

## 2018-01-13 MED ORDER — LIDOCAINE 2% (20 MG/ML) 5 ML SYRINGE
INTRAMUSCULAR | Status: DC | PRN
  Administered 2018-01-13 (×2): 50 mg via INTRAVENOUS

## 2018-01-13 MED ORDER — BUPIVACAINE HCL (PF) 0.25 % IJ SOLN
INTRAMUSCULAR | Status: AC
  Filled 2018-01-13: qty 30

## 2018-01-13 MED ORDER — FENTANYL CITRATE (PF) 100 MCG/2ML IJ SOLN
25.0000 ug | INTRAMUSCULAR | Status: DC | PRN
  Administered 2018-01-13 (×3): 50 ug via INTRAVENOUS

## 2018-01-13 MED ORDER — ESMOLOL HCL 100 MG/10ML IV SOLN
INTRAVENOUS | Status: DC | PRN
  Administered 2018-01-13: 10 mg via INTRAVENOUS

## 2018-01-13 MED ORDER — MIDAZOLAM HCL 2 MG/2ML IJ SOLN
INTRAMUSCULAR | Status: AC
  Filled 2018-01-13: qty 2

## 2018-01-13 MED ORDER — FENTANYL CITRATE (PF) 250 MCG/5ML IJ SOLN
INTRAMUSCULAR | Status: AC
  Filled 2018-01-13: qty 5

## 2018-01-13 MED ORDER — TRAMADOL HCL 50 MG PO TABS
50.0000 mg | ORAL_TABLET | Freq: Four times a day (QID) | ORAL | Status: DC | PRN
  Administered 2018-01-13: 50 mg via ORAL

## 2018-01-13 MED ORDER — CEFAZOLIN SODIUM-DEXTROSE 2-4 GM/100ML-% IV SOLN
2.0000 g | INTRAVENOUS | Status: AC
  Administered 2018-01-13: 2 g via INTRAVENOUS
  Filled 2018-01-13: qty 100

## 2018-01-13 MED ORDER — ONDANSETRON HCL 4 MG/2ML IJ SOLN: 4.0000 mg | Freq: Once | INTRAMUSCULAR | Status: DC | PRN

## 2018-01-13 MED ORDER — LACTATED RINGERS IV SOLN
INTRAVENOUS | Status: DC
  Administered 2018-01-13 (×2): via INTRAVENOUS

## 2018-01-13 MED ORDER — TRAMADOL HCL 50 MG PO TABS: 50.0000 mg | ORAL_TABLET | Freq: Four times a day (QID) | ORAL | 0 refills | Status: DC | PRN

## 2018-01-13 MED ORDER — OXYCODONE HCL 5 MG/5ML PO SOLN
5.0000 mg | Freq: Once | ORAL | Status: AC | PRN
  Filled 2018-01-13: qty 5

## 2018-01-13 MED ORDER — FENTANYL CITRATE (PF) 100 MCG/2ML IJ SOLN
INTRAMUSCULAR | Status: DC | PRN
  Administered 2018-01-13 (×3): 50 ug via INTRAVENOUS

## 2018-01-13 MED ORDER — ONDANSETRON HCL 4 MG/2ML IJ SOLN
INTRAMUSCULAR | Status: AC
  Filled 2018-01-13: qty 2

## 2018-01-13 MED ORDER — TRAMADOL HCL 50 MG PO TABS
ORAL_TABLET | ORAL | Status: AC
  Filled 2018-01-13: qty 1

## 2018-01-13 SURGICAL SUPPLY — 37 items
ADH SKN CLS APL DERMABOND .7 (GAUZE/BANDAGES/DRESSINGS) ×1
ATTRACTOMAT 16X20 MAGNETIC DRP (DRAPES) ×3 IMPLANT
BLADE SURG 15 STRL LF DISP TIS (BLADE) ×1 IMPLANT
BLADE SURG 15 STRL SS (BLADE) ×3
CHLORAPREP W/TINT 26ML (MISCELLANEOUS) ×3 IMPLANT
CLIP VESOCCLUDE MED 6/CT (CLIP) ×6 IMPLANT
CLIP VESOCCLUDE SM WIDE 6/CT (CLIP) ×6 IMPLANT
CLOSURE WOUND 1/2 X4 (GAUZE/BANDAGES/DRESSINGS)
COVER SURGICAL LIGHT HANDLE (MISCELLANEOUS) ×3 IMPLANT
DERMABOND ADVANCED (GAUZE/BANDAGES/DRESSINGS) ×2
DERMABOND ADVANCED .7 DNX12 (GAUZE/BANDAGES/DRESSINGS) IMPLANT
DRAPE LAPAROTOMY T 98X78 PEDS (DRAPES) ×3 IMPLANT
ELECT PENCIL ROCKER SW 15FT (MISCELLANEOUS) ×3 IMPLANT
ELECT REM PT RETURN 15FT ADLT (MISCELLANEOUS) ×3 IMPLANT
GAUZE 4X4 16PLY RFD (DISPOSABLE) ×3 IMPLANT
GLOVE SURG ORTHO 8.0 STRL STRW (GLOVE) ×3 IMPLANT
GOWN STRL REUS W/TWL XL LVL3 (GOWN DISPOSABLE) ×9 IMPLANT
HEMOSTAT SURGICEL 2X4 FIBR (HEMOSTASIS) IMPLANT
ILLUMINATOR WAVEGUIDE N/F (MISCELLANEOUS) IMPLANT
KIT BASIN OR (CUSTOM PROCEDURE TRAY) ×3 IMPLANT
LIGHT WAVEGUIDE WIDE FLAT (MISCELLANEOUS) IMPLANT
NDL HYPO 25X1 1.5 SAFETY (NEEDLE) ×1 IMPLANT
NEEDLE HYPO 25X1 1.5 SAFETY (NEEDLE) ×3 IMPLANT
PACK BASIC VI WITH GOWN DISP (CUSTOM PROCEDURE TRAY) ×3 IMPLANT
POWDER SURGICEL 3.0 GRAM (HEMOSTASIS) IMPLANT
STRIP CLOSURE SKIN 1/2X4 (GAUZE/BANDAGES/DRESSINGS) IMPLANT
SUT MNCRL AB 4-0 PS2 18 (SUTURE) ×3 IMPLANT
SUT SILK 2 0 (SUTURE)
SUT SILK 2-0 18XBRD TIE 12 (SUTURE) IMPLANT
SUT VIC AB 3-0 SH 18 (SUTURE) ×3 IMPLANT
SYR BULB IRRIGATION 50ML (SYRINGE) ×3 IMPLANT
SYR CONTROL 10ML LL (SYRINGE) ×3 IMPLANT
TOWEL OR 17X26 10 PK STRL BLUE (TOWEL DISPOSABLE) ×3 IMPLANT
TOWEL OR NON WOVEN STRL DISP B (DISPOSABLE) ×3 IMPLANT
TUBING CONNECTING 10 (TUBING) ×1 IMPLANT
TUBING CONNECTING 10' (TUBING) ×1
YANKAUER SUCT BULB TIP 10FT TU (MISCELLANEOUS) ×1 IMPLANT

## 2018-01-13 NOTE — Transfer of Care (Signed)
Immediate Anesthesia Transfer of Care Note  Patient: Elizabeth Meyer  Procedure(s) Performed: RIGHT SUPERIOR PARATHYROIDECTOMY (Right Neck)  Patient Location: PACU  Anesthesia Type:General  Level of Consciousness: awake, alert  and oriented  Airway & Oxygen Therapy: Patient Spontanous Breathing and Patient connected to face mask oxygen  Post-op Assessment: Report given to RN and Post -op Vital signs reviewed and stable  Post vital signs: Reviewed and stable  Last Vitals:  Vitals Value Taken Time  BP    Temp    Pulse 71 01/13/2018 11:49 AM  Resp 7 01/13/2018 11:49 AM  SpO2 100 % 01/13/2018 11:49 AM  Vitals shown include unvalidated device data.  Last Pain:  Vitals:   01/13/18 0739  TempSrc: Oral  PainSc: 0-No pain         Complications: No apparent anesthesia complications

## 2018-01-13 NOTE — Anesthesia Procedure Notes (Signed)
Procedure Name: Intubation Date/Time: 01/13/2018 10:50 AM Performed by: Maxwell Caul, CRNA Pre-anesthesia Checklist: Patient identified, Emergency Drugs available, Suction available and Patient being monitored Patient Re-evaluated:Patient Re-evaluated prior to induction Oxygen Delivery Method: Circle system utilized Preoxygenation: Pre-oxygenation with 100% oxygen Induction Type: IV induction Ventilation: Mask ventilation without difficulty Laryngoscope Size: Mac and 3 Grade View: Grade I Tube type: Oral Tube size: 7.0 mm Number of attempts: 1 Airway Equipment and Method: Stylet Placement Confirmation: ETT inserted through vocal cords under direct vision,  positive ETCO2 and breath sounds checked- equal and bilateral Secured at: 20 cm Tube secured with: Tape Dental Injury: Teeth and Oropharynx as per pre-operative assessment

## 2018-01-13 NOTE — Op Note (Signed)
OPERATIVE REPORT - PARATHYROIDECTOMY  Preoperative diagnosis: Primary hyperparathyroidism  Postop diagnosis: Same  Procedure: Right superior minimally invasive parathyroidectomy  Surgeon:  Armandina Gemma, MD  Anesthesia: General endotracheal  Estimated blood loss: Minimal  Preparation: ChloraPrep  Indications: Patient is referred by Dr. Philemon Kingdom for surgical evaluation and management of suspected primary hyperparathyroidism. Patient's primary care physician is Dr. Roma Schanz. Patient was complaining of bilateral knee pain, bilateral hip pain, and lower back pain. As part of an evaluation she had laboratory studies which included calcium and PTH levels. Patient was found to have elevated calcium levels ranging from 10.2-10.6. Intact PTH level was elevated at 121. 25-hydroxy vitamin D level was normal at 35. 24-hour urine collection for calcium was normal at 198. Patient has had bone density scans which do not show evidence of osteopenia or osteoporosis. 4D-CT scan positive for right superior parathyroid adenoma. Patient now comes to surgery for minimally invasive parathyroidectomy.  Procedure: The patient was prepared in the pre-operative holding area. The patient was brought to the operating room and placed in a supine position on the operating room table. Following administration of general anesthesia, the patient was positioned and then prepped and draped in the usual strict aseptic fashion. After ascertaining that an adequate level of anesthesia been achieved, a neck incision was made with a #15 blade. Dissection was carried through subcutaneous tissues and platysma. Hemostasis was obtained with the electrocautery. Skin flaps were developed circumferentially and a Weitlander retractor was placed for exposure.  Strap muscles were incised in the midline. Strap muscles were reflected lateralley exposing the thyroid lobe. With gentle blunt dissection the thyroid lobe was  mobilized.  Dissection was carried through adipose tissue and an enlarged parathyroid gland was identified. It was gently mobilized. Vascular structures were divided between small and medium ligaclips. Care was taken to avoid the recurrent laryngeal nerve and the esophagus. The parathyroid gland was completely excised. It was submitted to pathology where frozen section confirmed parathyroid tissue consistent with adenoma.  Neck was irrigated with warm saline and good hemostasis was noted. Fibrillar was placed in the operative field. Strap muscles were reapproximated in the midline with interrupted 3-0 Vicryl sutures. Platysma was closed with interrupted 3-0 Vicryl sutures. Skin was closed with a running 4-0 Monocryl subcuticular suture. Marcaine was infiltrated circumferentially. Wound was washed and dried and Dermabond was applied. Patient was awakened from anesthesia and brought to the recovery room. The patient tolerated the procedure well.   Armandina Gemma, MD [facility] Office: [phone]

## 2018-01-13 NOTE — Anesthesia Preprocedure Evaluation (Signed)
Anesthesia Evaluation  Patient identified by MRN, date of birth, ID band Patient awake    Reviewed: Allergy & Precautions, NPO status , Unable to perform ROS - Chart review only  Airway Mallampati: II  TM Distance: >3 FB Neck ROM: Full    Dental  (+) Teeth Intact, Dental Advisory Given   Pulmonary    breath sounds clear to auscultation       Cardiovascular  Rhythm:Regular Rate:Normal     Neuro/Psych    GI/Hepatic   Endo/Other    Renal/GU      Musculoskeletal   Abdominal   Peds  Hematology   Anesthesia Other Findings   Reproductive/Obstetrics                             Anesthesia Physical Anesthesia Plan  ASA: II  Anesthesia Plan: General   Post-op Pain Management:    Induction: Intravenous  PONV Risk Score and Plan: Ondansetron and Dexamethasone  Airway Management Planned: Oral ETT  Additional Equipment:   Intra-op Plan:   Post-operative Plan: Extubation in OR  Informed Consent: I have reviewed the patients History and Physical, chart, labs and discussed the procedure including the risks, benefits and alternatives for the proposed anesthesia with the patient or authorized representative who has indicated his/her understanding and acceptance.   Dental advisory given  Plan Discussed with: CRNA and Anesthesiologist  Anesthesia Plan Comments:         Anesthesia Quick Evaluation

## 2018-01-13 NOTE — Interval H&P Note (Signed)
History and Physical Interval Note:  01/13/2018 10:27 AM  Elizabeth Meyer  has presented today for surgery, with the diagnosis of primary hyperparathyroidism.  The various methods of treatment have been discussed with the patient and family. After consideration of risks, benefits and other options for treatment, the patient has consented to    Procedure(s): RIGHT SUPERIOR PARATHYROIDECTOMY (Right) as a surgical intervention .    The patient's history has been reviewed, patient examined, no change in status, stable for surgery.  I have reviewed the patient's chart and labs.  Questions were answered to the patient's satisfaction.    Armandina Gemma, [facility] Office: [place]

## 2018-01-14 ENCOUNTER — Encounter (HOSPITAL_COMMUNITY): Payer: Self-pay | Admitting: Surgery

## 2018-01-14 NOTE — Progress Notes (Signed)
Please contact patient and notify of benign pathology results.  Quency Tober M. Tiffeny Minchew, MD, FACS [facility] Office: [phone]   

## 2018-01-14 NOTE — Anesthesia Postprocedure Evaluation (Signed)
Anesthesia Post Note  Patient: Elizabeth Meyer  Procedure(s) Performed: RIGHT SUPERIOR PARATHYROIDECTOMY (Right Neck)     Patient location during evaluation: PACU Anesthesia Type: General Level of consciousness: awake and alert Pain management: pain level controlled Vital Signs Assessment: post-procedure vital signs reviewed and stable Respiratory status: spontaneous breathing, nonlabored ventilation, respiratory function stable and patient connected to nasal cannula oxygen Cardiovascular status: blood pressure returned to baseline and stable Postop Assessment: no apparent nausea or vomiting Anesthetic complications: no    Last Vitals:  Vitals:   01/13/18 1426 01/13/18 1513  BP: 100/75 134/87  Pulse: 72 72  Resp: 18 18  Temp: 37 C 37 C  SpO2:  97%    Last Pain:  Vitals:   01/13/18 1600  TempSrc:   PainSc: 4                  Marinna Blane COKER

## 2018-01-26 DIAGNOSIS — E210 Primary hyperparathyroidism: Secondary | ICD-10-CM | POA: Diagnosis not present

## 2018-01-27 ENCOUNTER — Encounter: Payer: Self-pay | Admitting: Family Medicine

## 2018-01-27 ENCOUNTER — Ambulatory Visit (INDEPENDENT_AMBULATORY_CARE_PROVIDER_SITE_OTHER): Payer: 59 | Admitting: Family Medicine

## 2018-01-27 VITALS — BP 116/80 | HR 71 | Temp 98.6°F | Resp 16 | Ht 62.0 in | Wt 151.8 lb

## 2018-01-27 DIAGNOSIS — J029 Acute pharyngitis, unspecified: Secondary | ICD-10-CM | POA: Diagnosis not present

## 2018-01-27 DIAGNOSIS — J069 Acute upper respiratory infection, unspecified: Secondary | ICD-10-CM | POA: Diagnosis not present

## 2018-01-27 LAB — POC INFLUENZA A&B (BINAX/QUICKVUE)
Influenza A, POC: NEGATIVE
Influenza B, POC: NEGATIVE

## 2018-01-27 LAB — POCT RAPID STREP A (OFFICE): Rapid Strep A Screen: NEGATIVE

## 2018-01-27 MED ORDER — FLUCONAZOLE 150 MG PO TABS: ORAL_TABLET | ORAL | 0 refills | Status: DC

## 2018-01-27 MED ORDER — FLUTICASONE PROPIONATE 50 MCG/ACT NA SUSP: 2.0000 | Freq: Every day | NASAL | 0 refills | Status: DC

## 2018-01-27 MED ORDER — CETIRIZINE HCL 10 MG PO TABS: 10.0000 mg | ORAL_TABLET | Freq: Every day | ORAL | 5 refills | Status: DC

## 2018-01-27 MED ORDER — AMOXICILLIN 875 MG PO TABS: 875.0000 mg | ORAL_TABLET | Freq: Two times a day (BID) | ORAL | 0 refills | Status: DC

## 2018-01-27 NOTE — Progress Notes (Signed)
Patient ID: Elizabeth Meyer, female    DOB: 08-25-1960  Age: 57 y.o. MRN: [id number]    Subjective:  Subjective  HPI Elizabeth Meyer presents for sore throat and congestion since Sat.  Pt works in a daycare.  No fever but she is taking tylenol/ advil regularly.  She just had surgery about 2 weeks ago.  No other complaints.   Review of Systems  Constitutional: Negative for appetite change, diaphoresis, fatigue and unexpected weight change.  HENT: Positive for congestion, postnasal drip, rhinorrhea, sneezing and sore throat.   Eyes: Negative for pain, redness and visual disturbance.  Respiratory: Negative for cough, chest tightness, shortness of breath and wheezing.   Cardiovascular: Negative for chest pain, palpitations and leg swelling.  Endocrine: Negative for cold intolerance, heat intolerance, polydipsia, polyphagia and polyuria.  Genitourinary: Negative for difficulty urinating, dysuria and frequency.  Neurological: Negative for dizziness, light-headedness, numbness and headaches.    History Past Medical History:  Diagnosis Date  . Complex cyst of left ovary   . Endometrial polyp   . Heart murmur Dr Johnsie Cancel   dx 2012--  Benign--  per echo abnormal mitral inflow  . History of gastrointestinal ulcer   . IBS (irritable bowel syndrome)   . Seasonal allergies   . Wears contact lenses     She has a past surgical history that includes transthoracic echocardiogram (08-12-2007); Plantar fascia surgery (Left, 2009); Colonoscopy with propofol (last one 04-28-2014); Dilatation & currettage/hysteroscopy with resectoscope (N/A, 06/06/2014); Laparoscopic salpingo oophorectomy (Left, 06/06/2014); Cystoscopy (N/A, 06/06/2014); Carpal tunnel release (Left, 05/15/2017); and Parathyroidectomy (Right, 01/13/2018).   Her family history includes Dementia in her paternal grandmother; Diabetes in her maternal grandfather; Glaucoma in her mother; Heart disease in her paternal grandfather; Hyperlipidemia in  her mother; Hypertension in her mother; Tuberculosis in her maternal grandfather and unknown relative.She reports that she has never smoked. She has never used smokeless tobacco. She reports that she drinks alcohol. She reports that she does not use drugs.  Current Outpatient Medications on File Prior to Visit  Medication Sig Dispense Refill  . Ibuprofen 200 MG CAPS Take 600 mg by mouth every 6 (six) hours as needed (pain).     . ranitidine (ZANTAC) 150 MG tablet TAKE 1 TABLET BY MOUTH TWICE A DAY (Patient taking differently: Take 150 mg by mouth daily as needed for heartburn. ) 60 tablet 2  . SUMAtriptan (IMITREX) 100 MG tablet Take 100 mg by mouth every 2 (two) hours as needed for migraine.   1  . traZODone (DESYREL) 50 MG tablet TAKE 1/2 TO 1 TABLETS BY MOUTH AT BEDTIME AS NEEDED FOR SLEEP. (Patient taking differently: Take 50 mg by mouth at bedtime. ) 90 tablet 0  . Vitamin D, Ergocalciferol, (DRISDOL) 50000 units CAPS capsule TAKE ONE CAPSULE BY MOUTH ONE TIME PER WEEK (Patient taking differently: Take 50,000 Units by mouth every Tuesday. ) 12 capsule 0   No current facility-administered medications on file prior to visit.      Objective:  Objective  Physical Exam  Constitutional: She is oriented to person, place, and time. She appears well-developed and well-nourished.  HENT:  Right Ear: Hearing, tympanic membrane, external ear and ear canal normal.  Left Ear: Hearing, tympanic membrane, external ear and ear canal normal.  + PND + errythema  Eyes: Conjunctivae are normal. Right eye exhibits no discharge. Left eye exhibits no discharge.  Cardiovascular: Normal rate, regular rhythm and normal heart sounds.  No murmur heard. Pulmonary/Chest: Effort normal and breath  sounds normal. No respiratory distress. She has no wheezes. She has no rhonchi. She has no rales. She exhibits no tenderness.  Musculoskeletal: She exhibits no edema.  Lymphadenopathy:    She has no cervical adenopathy.    Neurological: She is alert and oriented to person, place, and time.  Nursing note and vitals reviewed.  BP 116/80 (BP Location: Left Arm, Cuff Size: Normal)   Pulse 71   Temp 98.6 F (37 C) (Oral)   Resp 16   Ht 5\' 2"  (1.575 m)   Wt 151 lb 12.8 oz (68.9 kg)   LMP 03/30/2016   SpO2 98%   BMI 27.76 kg/m  Wt Readings from Last 3 Encounters:  01/27/18 151 lb 12.8 oz (68.9 kg)  01/13/18 150 lb (68 kg)  01/09/18 155 lb (70.3 kg)     Lab Results  Component Value Date   WBC 6.4 01/09/2018   HGB 12.4 01/09/2018   HCT 37.3 01/09/2018   PLT 329 01/09/2018   GLUCOSE 95 01/09/2018   ALT 15 09/09/2017   AST 14 09/09/2017   NA 145 01/09/2018   K 3.6 01/09/2018   CL 111 01/09/2018   CREATININE 0.64 01/09/2018   BUN 15 01/09/2018   CO2 28 01/09/2018   TSH 0.41 09/09/2017    No results found.   Assessment & Plan:  Plan  I have discontinued Haru C. Kamm's Diclofenac Sodium and traMADol. I have also changed her cetirizine. Additionally, I am having her start on amoxicillin and fluconazole. Lastly, I am having her maintain her Ibuprofen, ranitidine, SUMAtriptan, traZODone, Vitamin D (Ergocalciferol), and fluticasone.  Meds ordered this encounter  Medications  . amoxicillin (AMOXIL) 875 MG tablet    Sig: Take 1 tablet (875 mg total) by mouth 2 (two) times daily.    Dispense:  20 tablet    Refill:  0  . fluconazole (DIFLUCAN) 150 MG tablet    Sig: 1 po x1, may repeat in 3 days prn    Dispense:  2 tablet    Refill:  0  . fluticasone (FLONASE) 50 MCG/ACT nasal spray    Sig: Place 2 sprays into both nostrils daily.    Dispense:  16 g    Refill:  0  . cetirizine (ZYRTEC) 10 MG tablet    Sig: Take 1 tablet (10 mg total) by mouth at bedtime.    Dispense:  30 tablet    Refill:  5    Problem List Items Addressed This Visit    None    Visit Diagnoses    Sore throat    -  Primary   Relevant Medications   amoxicillin (AMOXIL) 875 MG tablet   Other Relevant Orders    Culture, Group A Strep   POCT rapid strep A (Completed)   POC Influenza A&B (Binax test) (Completed)   Viral upper respiratory tract infection       Relevant Medications   amoxicillin (AMOXIL) 875 MG tablet   fluconazole (DIFLUCAN) 150 MG tablet   fluticasone (FLONASE) 50 MCG/ACT nasal spray   cetirizine (ZYRTEC) 10 MG tablet   Other Relevant Orders   POC Influenza A&B (Binax test) (Completed)    poc strep neg   Follow-up: Return if symptoms worsen or fail to improve.  [name], DO

## 2018-01-27 NOTE — Patient Instructions (Signed)

## 2018-01-28 ENCOUNTER — Other Ambulatory Visit: Payer: Self-pay | Admitting: Family Medicine

## 2018-01-29 LAB — CULTURE, GROUP A STREP
MICRO NUMBER:: 91268617
SPECIMEN QUALITY:: ADEQUATE

## 2018-02-14 ENCOUNTER — Ambulatory Visit (INDEPENDENT_AMBULATORY_CARE_PROVIDER_SITE_OTHER): Payer: 59 | Admitting: Family Medicine

## 2018-02-14 ENCOUNTER — Encounter: Payer: Self-pay | Admitting: Family Medicine

## 2018-02-14 VITALS — BP 110/80 | HR 90 | Temp 98.5°F | Resp 16 | Ht 62.0 in | Wt 151.0 lb

## 2018-02-14 DIAGNOSIS — B9689 Other specified bacterial agents as the cause of diseases classified elsewhere: Secondary | ICD-10-CM

## 2018-02-14 DIAGNOSIS — J329 Chronic sinusitis, unspecified: Secondary | ICD-10-CM

## 2018-02-14 MED ORDER — AMOXICILLIN-POT CLAVULANATE 875-125 MG PO TABS: 1.0000 | ORAL_TABLET | Freq: Two times a day (BID) | ORAL | 0 refills | Status: AC

## 2018-02-14 NOTE — Patient Instructions (Signed)
Sinsusitis Bacterial based on: Symptoms >10 days, double sickening. We are concerned this perhaps never fully cleared on amoxicillin so want to be aggressive and go ahead and treat with agumentin- with that being said we also discussed some of this could be immune response to the flu shot (but not the actual flu). Can continue her mucinex Dm  Finally, we reviewed reasons to return to care including if symptoms worsen or persist or new concerns arise (particularly fever or shortness of breath)

## 2018-02-14 NOTE — Progress Notes (Signed)
PCP: [name], DO  Subjective:  Elizabeth Meyer is a 57 y.o. year old very pleasant female patient who presents with sinusitis symptoms including nasal congestion with yellow/green mucus, sinus tenderness, cough. Also having ear pressure bilaterally.   2 weeks ago saw Dr. Etter Sjogren for sore throat and was treated with amoxicillin. Felt like got almost 100% better. Then was out in cold air at a ball game and then Thursday- noted with congestion, runny nose, trouble breathing due to volume of congestion, coughing.   Had flu shot on Wednesday- wonders if that worsened symptoms.  -day of illness: 3 of current illness but could be a continuum from 2 weeks ago when treated -Symptoms are worsening over last 36 hours thought stable in last 12 hours -previous treatments: mucinex Dm q12 hours helpful.  -sick contacts/travel/risks: denies flu exposure. 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  -Hx of: allergies  ROS-denies fever, SOB, NVD, tooth pain  Pertinent Past Medical History-  Patient Active Problem List   Diagnosis Date Noted  . Hyperparathyroidism, primary ([place]) 01/11/2018  . Low back pain 09/09/2017  . Elevated LDL cholesterol level 06/17/2017  . Acquired hypothyroidism 06/17/2017  . Post-menopausal 06/17/2017  . Cough 05/30/2017  . Suprapubic pain 05/30/2017  . Patellofemoral syndrome of both knees 12/19/2016  . Allergic dermatitis 09/16/2015  . Plantar fasciitis, right 07/27/2014  . Loss of transverse plantar arch 07/27/2014  . Pharyngitis 06/21/2014  . 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    Class: Present on Admission  . Endometrial polyp 06/06/2014    Class: Present on Admission  . Sinusitis, acute frontal 06/03/2014  . IBS (irritable bowel syndrome) 03/08/2014    Medications- reviewed  Current Outpatient Medications  Medication Sig Dispense Refill  . cetirizine (ZYRTEC) 10 MG tablet Take 1 tablet (10 mg total) by mouth at bedtime.  30 tablet 5  . fluconazole (DIFLUCAN) 150 MG tablet 1 po x1, may repeat in 3 days prn 2 tablet 0  . fluticasone (FLONASE) 50 MCG/ACT nasal spray Place 2 sprays into both nostrils daily. 16 g 0  . Ibuprofen 200 MG CAPS Take 600 mg by mouth every 6 (six) hours as needed (pain).     . ranitidine (ZANTAC) 150 MG tablet TAKE 1 TABLET BY MOUTH TWICE A DAY (Patient taking differently: Take 150 mg by mouth daily as needed for heartburn. ) 60 tablet 2  . SUMAtriptan (IMITREX) 100 MG tablet Take 100 mg by mouth every 2 (two) hours as needed for migraine.   1  . traZODone (DESYREL) 50 MG tablet TAKE 1/2 TO 1 TABLETS BY MOUTH AT BEDTIME AS NEEDED FOR SLEEP. 90 tablet 0  . Vitamin D, Ergocalciferol, (DRISDOL) 50000 units CAPS capsule TAKE ONE CAPSULE BY MOUTH ONE TIME PER WEEK (Patient taking differently: Take 50,000 Units by mouth every Tuesday. ) 12 capsule 0   No current facility-administered medications for this visit.     Objective: BP 110/80 (BP Location: Left Arm, Patient Position: Sitting, Cuff Size: Small)   Pulse 90   Temp 98.5 F (36.9 C) (Oral)   Resp 16   Ht 5\' 2"  (1.575 m)   Wt 151 lb (68.5 kg)   LMP 03/30/2016   SpO2 98%   BMI 27.62 kg/m  Gen: NAD, resting comfortably HEENT: Turbinates erythematous with minimal drainage, TM normal, pharynx mildly erythematous with no tonsilar exudate or edema, maxillary and frontal sinus tenderness CV: RRR no murmurs rubs or gallops  Lungs: CTAB no crackles, wheeze, rhonchi Ext: no edema Skin: warm, dry, no rash Neuro: grossly normal, moves all extremities  Assessment/Plan:  Sinsusitis Bacterial based on: Symptoms >10 days, double sickening (treated 2 weeks ago and not clear if every fully resolved). We are concerned this perhaps never fully cleared on amoxicillin so want to be aggressive and go ahead and treat with agumentin- with that being said we also discussed some of this could be immune response to the flu shot (but not the actual flu). Can  continue her mucinex Dm  Finally, we reviewed reasons to return to care including if symptoms worsen or persist or new concerns arise (particularly fever or shortness of breath)  Meds ordered this encounter  Medications  . amoxicillin-clavulanate (AUGMENTIN) 875-125 MG tablet    Sig: Take 1 tablet by mouth 2 (two) times daily for 7 days.    Dispense:  14 tablet    Refill:  0   Garret Reddish, MD

## 2018-02-18 ENCOUNTER — Other Ambulatory Visit: Payer: Self-pay | Admitting: Family Medicine

## 2018-02-18 DIAGNOSIS — J069 Acute upper respiratory infection, unspecified: Secondary | ICD-10-CM

## 2018-03-12 ENCOUNTER — Other Ambulatory Visit: Payer: Self-pay

## 2018-03-12 MED ORDER — VITAMIN D (ERGOCALCIFEROL) 1.25 MG (50000 UNIT) PO CAPS: ORAL_CAPSULE | ORAL | 0 refills | Status: DC

## 2018-03-16 NOTE — Progress Notes (Signed)
[facility] [place], [name] [address] Phone: [phone] Subjective:   [name], am serving as a scribe for Dr. Hulan Saas.   CC: Bilateral knee pain  MPN:[id number]  Elizabeth Meyer is a 57 y.o. female coming in with complaint of bilateral knee pain. Right side is worse than left. Feeling an increase in her pain recently. Did have relief from injections given in May.  Patient since she had her parathyroid hormone removed and is feeling relatively better.  Patient still having difficulty finding energy.  Has noticed some mild swelling in the knees.  Not giving out on her but severe enough to have her come in.  Has responded well to the trazodone to help her with sleep.  Vitamin D has helped her with some muscle strength and some of her aches and pains.       Past Medical History:  Diagnosis Date  . Complex cyst of left ovary   . Endometrial polyp   . Heart murmur Dr Johnsie Cancel   dx 2012--  Benign--  per echo abnormal mitral inflow  . History of gastrointestinal ulcer   . IBS (irritable bowel syndrome)   . Seasonal allergies   . Wears contact lenses    Past Surgical History:  Procedure Laterality Date  . CARPAL TUNNEL RELEASE Left 05/15/2017   Procedure: LEFT CARPAL TUNNEL RELEASE;  Surgeon: [name], MD;  Location: [facility];  Service: Orthopedics;  Laterality: Left;  . COLONOSCOPY WITH PROPOFOL  last one 04-28-2014  . CYSTOSCOPY N/A 06/06/2014   Procedure: CYSTOSCOPY;  Surgeon: [name], MD;  Location: [facility];  Service: Gynecology;  Laterality: N/A;  . DILATATION & CURRETTAGE/HYSTEROSCOPY WITH RESECTOCOPE N/A 06/06/2014   Procedure: DILATATION & CURETTAGE/HYSTEROSCOPY WITH RESECTOCOPE;  Surgeon: [name], MD;  Location: [facility];  Service: Gynecology;  Laterality: N/A;  . LAPAROSCOPIC SALPINGO OOPHERECTOMY Left 06/06/2014   Procedure: LAPAROSCOPIC LEFT SALPINGO  OOPHORECTOMY;  Surgeon: [name], MD;  Location: [facility];  Service: Gynecology;  Laterality: Left;  . PARATHYROIDECTOMY Right 01/13/2018   Procedure: RIGHT SUPERIOR PARATHYROIDECTOMY;  Surgeon: Armandina Gemma, MD;  Location: [facility];  Service: General;  Laterality: Right;  . PLANTAR FASCIA SURGERY Left 2009   topaz procedure  . TRANSTHORACIC ECHOCARDIOGRAM  08-12-2007   normal LV,  ef 65-70%,  abnormal mitral inflow with trivial MR,  trivial PR and TR   Social History   Socioeconomic History  . Marital status: Married    Spouse name: Not on file  . Number of children: 2  . Years of education: Not on file  . Highest education level: Not on file  Occupational History  . Occupation: Agricultural consultant: [employer]  Social Needs  . Financial resource strain: Not on file  . Food insecurity:    Worry: Not on file    Inability: Not on file  . Transportation needs:    Medical: Not on file    Non-medical: Not on file  Tobacco Use  . Smoking status: Never Smoker  . Smokeless tobacco: Never Used  Substance and Sexual Activity  . Alcohol use: Yes    Comment: occasional  . Drug use: Never  . Sexual activity: Not on file  Lifestyle  . Physical activity:    Days per week: Not on file    Minutes per session: Not on file  . Stress: Not on file  Relationships  . Social connections:    Talks on phone: Not on file    Gets together: Not on file    Attends religious service: Not on file    Active member of club or organization: Not on file    Attends meetings of clubs or organizations: Not on file    Relationship status: Not on file  Other Topics Concern  . Not on file  Social History Narrative   Married, Aeronautical engineer 2 daughters   1-2 caffeinated beverages/day   No Known Allergies Family History  Problem Relation Age of Onset  . Glaucoma Mother   . Hypertension Mother   . Hyperlipidemia Mother   . Heart disease Paternal Grandfather   .  Tuberculosis Maternal Grandfather   . Diabetes Maternal Grandfather   . Dementia Paternal Grandmother   . Tuberculosis Unknown   . Colon cancer Neg Hx   . Stomach cancer Neg Hx       Current Outpatient Medications (Respiratory):  .  cetirizine (ZYRTEC) 10 MG tablet, Take 1 tablet (10 mg total) by mouth at bedtime. .  fluticasone (FLONASE) 50 MCG/ACT nasal spray, SPRAY 2 SPRAYS INTO EACH NOSTRIL EVERY DAY  Current Outpatient Medications (Analgesics):  Marland Kitchen  Ibuprofen 200 MG CAPS, Take 600 mg by mouth every 6 (six) hours as needed (pain).  .  SUMAtriptan (IMITREX) 100 MG tablet, Take 100 mg by mouth every 2 (two) hours as needed for migraine.    Current Outpatient Medications (Other):  .  fluconazole (DIFLUCAN) 150 MG tablet, 1 po x1, may repeat in 3 days prn .  ranitidine (ZANTAC) 150 MG tablet, TAKE 1 TABLET BY MOUTH TWICE A DAY (Patient taking differently: Take 150 mg by mouth daily as needed for heartburn. ) .  traZODone (DESYREL) 50 MG tablet, Take 1 tablet (50 mg total) by mouth at bedtime as needed for sleep. .  Vitamin D, Ergocalciferol, (DRISDOL) 1.25 MG (50000 UT) CAPS capsule, TAKE ONE CAPSULE BY MOUTH ONE TIME PER WEEK    Past medical history, social, surgical and family history all reviewed in electronic medical record.  No pertanent information unless stated regarding to the chief complaint.   Review of Systems:  No headache, visual changes, nausea, vomiting, diarrhea, constipation, dizziness, abdominal pain, skin rash, fevers, chills, night sweats, weight loss, swollen lymph nodes, body aches, joint swelling, chest pain, shortness of breath, mood changes.  Positive muscle aches  Objective  Blood pressure 126/86, height 5\' 2"  (1.575 m), weight 151 lb (68.5 kg), last menstrual period 03/30/2016, SpO2 97 %.    General: No apparent distress alert and oriented x3 mood and affect normal, dressed appropriately.  HEENT: Pupils equal, extraocular movements intact  Respiratory:  Patient's speak in full sentences and does not appear short of breath  Cardiovascular: No lower extremity edema, non tender, no erythema  Skin: Warm dry intact with no signs of infection or rash on extremities or on axial skeleton.  Abdomen: Soft nontender  Neuro: Cranial nerves II through XII are intact, neurovascularly intact in all extremities with 2+ DTRs and 2+ pulses.  Lymph: No lymphadenopathy of posterior or anterior cervical chain or axillae bilaterally.  Gait normal with good balance and coordination.  MSK:  Non tender with full range of motion and good stability and symmetric strength and tone of shoulders, elbows, wrist, hip, and ankles bilaterally.  Knee: Bilateral Normal to inspection with no erythema or effusion or obvious bony abnormalities. Palpation normal with no warmth, joint line tenderness, patellar  tenderness, or condyle tenderness. ROM full in flexion and extension and lower leg rotation. Ligaments with solid consistent endpoints including ACL, PCL, LCL, MCL. Negative Mcmurray's, Apley's, and Thessalonian tests. painful patellar compression. Patellar glide with moderate crepitus. Patellar and quadriceps tendons unremarkable. Hamstring and quadriceps strength is normal.  After informed written and verbal consent, patient was seated on exam table. Right knee was prepped with alcohol swab and utilizing anterolateral approach, patient's right knee space was injected with 4:1  marcaine 0.5%: Kenalog 40mg /dL. Patient tolerated the procedure well without immediate complications.  After informed written and verbal consent, patient was seated on exam table. Left knee was prepped with alcohol swab and utilizing anterolateral approach, patient's left knee space was injected with 4:1  marcaine 0.5%: Kenalog 40mg /dL. Patient tolerated the procedure well without immediate complications.   Impression and Recommendations:     This case required medical decision making of moderate  complexity. The above documentation has been reviewed and is accurate and complete Lyndal Pulley, DO       Note: This dictation was prepared with Dragon dictation along with smaller phrase technology. Any transcriptional errors that result from this process are unintentional.

## 2018-03-17 ENCOUNTER — Encounter: Payer: Self-pay | Admitting: Family Medicine

## 2018-03-17 ENCOUNTER — Ambulatory Visit (INDEPENDENT_AMBULATORY_CARE_PROVIDER_SITE_OTHER): Payer: 59 | Admitting: Family Medicine

## 2018-03-17 DIAGNOSIS — M222X1 Patellofemoral disorders, right knee: Secondary | ICD-10-CM

## 2018-03-17 DIAGNOSIS — M222X2 Patellofemoral disorders, left knee: Secondary | ICD-10-CM | POA: Diagnosis not present

## 2018-03-17 MED ORDER — VITAMIN D (ERGOCALCIFEROL) 1.25 MG (50000 UNIT) PO CAPS: ORAL_CAPSULE | ORAL | 0 refills | Status: DC

## 2018-03-17 MED ORDER — TRAZODONE HCL 50 MG PO TABS: 50.0000 mg | ORAL_TABLET | Freq: Every evening | ORAL | 1 refills | Status: DC | PRN

## 2018-03-17 NOTE — Assessment & Plan Note (Signed)
Patient is a relatively well.  Bilateral injections given today.  Discussed icing regimen and home exercises.  Discussed which activities to do which was to avoid.  Discussed topical anti-inflammatories.  Follow-up again in 4 to 8 weeks

## 2018-03-17 NOTE — Patient Instructions (Signed)
Good to see you  You know the drill  Ice is your friend Injected both knees See me when you need me or when you miss me Happy holidays!

## 2018-03-31 ENCOUNTER — Ambulatory Visit (INDEPENDENT_AMBULATORY_CARE_PROVIDER_SITE_OTHER): Payer: 59 | Admitting: Internal Medicine

## 2018-03-31 ENCOUNTER — Encounter: Payer: Self-pay | Admitting: Internal Medicine

## 2018-03-31 VITALS — BP 122/78 | HR 85 | Temp 97.5°F | Resp 16 | Ht 62.0 in | Wt 148.0 lb

## 2018-03-31 DIAGNOSIS — J40 Bronchitis, not specified as acute or chronic: Secondary | ICD-10-CM

## 2018-03-31 MED ORDER — AZITHROMYCIN 250 MG PO TABS: ORAL_TABLET | ORAL | 0 refills | Status: DC

## 2018-03-31 MED ORDER — HYDROCODONE-HOMATROPINE 5-1.5 MG/5ML PO SYRP: 5.0000 mL | ORAL_SOLUTION | Freq: Every evening | ORAL | 0 refills | Status: DC | PRN

## 2018-03-31 MED ORDER — AZELASTINE HCL 0.1 % NA SOLN: 2.0000 | Freq: Every evening | NASAL | 3 refills | Status: DC | PRN

## 2018-03-31 NOTE — Patient Instructions (Signed)
Rest, fluids , tylenol  For cough:  Take Mucinex DM twice a day as needed until better Hydrocodone only if severe cough at night   For nasal congestion: Use OTC   Flonase : 2 nasal sprays on each side of the nose in the morning until you feel better Use ASTELIN a prescribed spray : 2 nasal sprays on each side of the nose at night until you feel better  ok Pseudoephedrine 30 to 60 mg twice a day   Take the antibiotic as prescribed  , zithromax, only if no better soon  Call if not gradually better over the next  10 days  Call anytime if the symptoms are severe

## 2018-03-31 NOTE — Progress Notes (Signed)
Subjective:    Patient ID: Elizabeth Meyer, female    DOB: 04/13/1960, 57 y.o.   MRN: [id number]  DOS:  03/31/2018 Type of visit - description: Acute Symptoms started 5 days ago with a dry cough, chest congestion.  She also have some sinus pressure for the last 2 - 3 days. Taking Mucinex and Sudafed with some relief.  Denies fever chills No myalgias No major headaches other than the sinus pressure. No nausea or vomiting. No history of asthma or tobacco abuse.   Review of Systems  As above Past Medical History:  Diagnosis Date  . Complex cyst of left ovary   . Endometrial polyp   . Heart murmur Dr Johnsie Cancel   dx 2012--  Benign--  per echo abnormal mitral inflow  . History of gastrointestinal ulcer   . IBS (irritable bowel syndrome)   . Seasonal allergies   . Wears contact lenses     Past Surgical History:  Procedure Laterality Date  . CARPAL TUNNEL RELEASE Left 05/15/2017   Procedure: LEFT CARPAL TUNNEL RELEASE;  Surgeon: [name], MD;  Location: [facility];  Service: Orthopedics;  Laterality: Left;  . COLONOSCOPY WITH PROPOFOL  last one 04-28-2014  . CYSTOSCOPY N/A 06/06/2014   Procedure: CYSTOSCOPY;  Surgeon: [name], MD;  Location[facility];  Service: Gynecology;  Laterality: N/A;  . DILATATION & CURRETTAGE/HYSTEROSCOPY WITH RESECTOCOPE N/A 06/06/2014   Procedure: DILATATION & CURETTAGE/HYSTEROSCOPY WITH RESECTOCOPE;  Surgeon: [name], MD;  Location: [facility];  Service: Gynecology;  Laterality: N/A;  . LAPAROSCOPIC SALPINGO OOPHERECTOMY Left 06/06/2014   Procedure: LAPAROSCOPIC LEFT SALPINGO OOPHORECTOMY;  Surgeon: [name], MD;  Location: [facility];  Service: Gynecology;  Laterality: Left;  . PARATHYROIDECTOMY Right 01/13/2018   Procedure: RIGHT SUPERIOR PARATHYROIDECTOMY;  Surgeon: Armandina Gemma, MD;  Location: [facility];  Service: General;  Laterality: Right;  . PLANTAR FASCIA SURGERY Left  2009   topaz procedure  . TRANSTHORACIC ECHOCARDIOGRAM  08-12-2007   normal LV,  ef 65-70%,  abnormal mitral inflow with trivial MR,  trivial PR and TR    Social History   Socioeconomic History  . Marital status: Married    Spouse name: Not on file  . Number of children: 2  . Years of education: Not on file  . Highest education level: Not on file  Occupational History  . Occupation: Agricultural consultant: [employer]  Social Needs  . Financial resource strain: Not on file  . Food insecurity:    Worry: Not on file    Inability: Not on file  . Transportation needs:    Medical: Not on file    Non-medical: Not on file  Tobacco Use  . Smoking status: Never Smoker  . Smokeless tobacco: Never Used  Substance and Sexual Activity  . Alcohol use: Yes    Comment: occasional  . Drug use: Never  . Sexual activity: Not on file  Lifestyle  . Physical activity:    Days per week: Not on file    Minutes per session: Not on file  . Stress: Not on file  Relationships  . Social connections:    Talks on phone: Not on file    Gets together: Not on file    Attends religious service: Not on file    Active member of club or organization: Not on file    Attends meetings of clubs or organizations: Not on file  Relationship status: Not on file  . Intimate partner violence:    Fear of current or ex partner: Not on file    Emotionally abused: Not on file    Physically abused: Not on file    Forced sexual activity: Not on file  Other Topics Concern  . Not on file  Social History Narrative   Married, pre-school teacher 2 daughters   1-2 caffeinated beverages/day      Allergies as of 03/31/2018   No Known Allergies     Medication List       Accurate as of March 31, 2018 11:23 AM. Always use your most recent med list.        cetirizine 10 MG tablet Commonly known as:  ZYRTEC Take 1 tablet (10 mg total) by mouth at bedtime.   fluconazole 150 MG tablet Commonly known  as:  DIFLUCAN 1 po x1, may repeat in 3 days prn   fluticasone 50 MCG/ACT nasal spray Commonly known as:  FLONASE SPRAY 2 SPRAYS INTO EACH NOSTRIL EVERY DAY   Ibuprofen 200 MG Caps Take 600 mg by mouth every 6 (six) hours as needed (pain).   ranitidine 150 MG tablet Commonly known as:  ZANTAC TAKE 1 TABLET BY MOUTH TWICE A DAY   SUMAtriptan 100 MG tablet Commonly known as:  IMITREX Take 100 mg by mouth every 2 (two) hours as needed for migraine.   traZODone 50 MG tablet Commonly known as:  DESYREL Take 1 tablet (50 mg total) by mouth at bedtime as needed for sleep.   Vitamin D (Ergocalciferol) 1.25 MG (50000 UT) Caps capsule Commonly known as:  DRISDOL TAKE ONE CAPSULE BY MOUTH ONE TIME PER WEEK           Objective:   Physical Exam BP 122/78 (BP Location: Left Arm, Patient Position: Sitting, Cuff Size: Small)   Pulse 85   Temp (!) 97.5 F (36.4 C) (Oral)   Resp 16   Ht 5\' 2"  (1.575 m)   Wt 148 lb (67.1 kg)   LMP 03/30/2016   SpO2 98%   BMI 27.07 kg/m   General:   Well developed, NAD, BMI noted. HEENT:  Normocephalic . Face symmetric, atraumatic. TMs normal.  Nose congested, sinuses slightly TTP.  Symmetric, no red Lungs:  + Rhonchi bilaterally, no wheezing. Normal respiratory effort, no intercostal retractions, no accessory muscle use. Heart: RRR,  no murmur.  No pretibial edema bilaterally  Skin: Not pale. Not jaundice Neurologic:  alert & oriented X3.  Speech normal, gait appropriate for age and unassisted Psych--  Cognition and judgment appear intact.  Cooperative with normal attention span and concentration.  Behavior appropriate. No anxious or depressed appearing.       Assessment     57 year old female, PMH includes IBS, hyperparathyroidism, benign heart murmur, presents with  Bronchitis: Supportive treatment with Mucinex DM, Flonase, Astelin.  She is also using pseudoephedrine with good results and no apparent side effects.  Continue with  it. If the cough is severe at night she will use hydrocodone. If she is not gradually improving will start Zithromax. Patient verbalized understanding of all instructions

## 2018-03-31 NOTE — Progress Notes (Signed)
Pre visit review using our clinic review tool, if applicable. No additional management support is needed unless otherwise documented below in the visit note. 

## 2018-04-14 ENCOUNTER — Ambulatory Visit: Payer: 59 | Admitting: Internal Medicine

## 2018-05-01 DIAGNOSIS — L821 Other seborrheic keratosis: Secondary | ICD-10-CM | POA: Diagnosis not present

## 2018-05-01 DIAGNOSIS — D2271 Melanocytic nevi of right lower limb, including hip: Secondary | ICD-10-CM | POA: Diagnosis not present

## 2018-05-01 DIAGNOSIS — D1801 Hemangioma of skin and subcutaneous tissue: Secondary | ICD-10-CM | POA: Diagnosis not present

## 2018-05-01 DIAGNOSIS — Z419 Encounter for procedure for purposes other than remedying health state, unspecified: Secondary | ICD-10-CM | POA: Diagnosis not present

## 2018-05-25 ENCOUNTER — Encounter: Payer: Self-pay | Admitting: Internal Medicine

## 2018-05-25 ENCOUNTER — Ambulatory Visit (INDEPENDENT_AMBULATORY_CARE_PROVIDER_SITE_OTHER): Payer: 59 | Admitting: Internal Medicine

## 2018-05-25 VITALS — BP 110/70 | HR 78 | Ht 62.0 in | Wt 150.0 lb

## 2018-05-25 DIAGNOSIS — M8580 Other specified disorders of bone density and structure, unspecified site: Secondary | ICD-10-CM

## 2018-05-25 DIAGNOSIS — Z8639 Personal history of other endocrine, nutritional and metabolic disease: Secondary | ICD-10-CM | POA: Diagnosis not present

## 2018-05-25 DIAGNOSIS — E041 Nontoxic single thyroid nodule: Secondary | ICD-10-CM | POA: Diagnosis not present

## 2018-05-25 DIAGNOSIS — E213 Hyperparathyroidism, unspecified: Secondary | ICD-10-CM

## 2018-05-25 NOTE — Progress Notes (Signed)
Patient ID: Elizabeth Meyer, female   DOB: Aug 22, 1960, 58 y.o.   MRN: [id number]    HPI  Elizabeth Meyer is a 58 y.o.-year-old female, initially referred by Dr Tamala Julian, 58 years returning for follow-up for hypercalcemia/hyperparathyroidism.  Last visit 7 months ago.    She now returns after having had parathyroid surgery 4 months ago.  Reviewed and addended history: Patient was found to have an elevated calcium during investigation by Dr. Tamala Julian, whom she sees for joint pain (hips, knees).  A PTH level was also found to be high.  She was referred to endocrinology for further investigation.  Reviewed pertinent labs: Postop, calcium was normal, at 9.4, and PTH greatly decreased, at 30. Lab Results  Component Value Date   PTH 98 (H) 10/07/2017   PTH 121 (H) 09/09/2017   CALCIUM 10.0 01/09/2018   CALCIUM 10.0 10/07/2017   CALCIUM 10.6 (H) 09/09/2017   CALCIUM 10.2 09/09/2017   No history of osteoporosis.  Latest DXA scan showed osteopenia, though:  Lumbar spine L1-L4 (L2) Femoral neck (FN) 33% distal radius frax  07/31/2017 -2.4 (-10.7%*) RFN: -1.3 LFN: -0.8 n/a 10-year fracture risk 5.9% 10-year hip fracture risk: 0.3%  02/24/2014 -1.5  n/a   *Statistically significant difference  The DEXA report is not clear in comparison between the hip bone mineral densities between 2019 and 2015.  It looks that the bone density have decreased but I cannot tell whether it is the total hip BMD's that were compared.    She had a high calcitriol level but normal magnesium and phosphorus: Component     Latest Ref Rng & Units 10/07/2017          Vitamin D 1, 25 (OH) Total     18 - 72 pg/mL 84 (H)  Vitamin D3 1, 25 (OH)     pg/mL 41  Vitamin D2 1, 25 (OH)     pg/mL 43  Magnesium     1.5 - 2.5 mg/dL 2.2  Phosphorus     2.3 - 4.6 mg/dL 2.9   24-hour urine calcium was not elevated, but close to the upper limit of normal: Component     Latest Ref Rng & Units 10/15/2017  Creatinine, 24H Ur     0.50 - 2.15  g/24 h 0.78  Calcium, 24H Urine     mg/24 h 198   No history of kidney stones.  Based on the decreased bone density and elevated PTH I suggested a referral to surgery at last visit.  Thyroid ultrasound (11/13/2017): 1 x 0.9 x 0.6 cm solid/almost completely solid, hypoechoic nodule in the right mid gland, but now sign of parathyroid adenoma.  Indication was for repeat ultrasound in a year.  Technetium sestamibi parathyroid scan (11/13/2017): Was negative for parathyroid adenoma  4D CT of the neck (12/04/2017): No abnormal left-sided finding. On the right, there appears to be relative enlargement of the superior parathyroid gland (marked with double arrows), but this retains a somewhat flattened appearance. This could represent an early adenoma or hyperplasia. Below that on the right, there are 2 small nodular foci at the tracheoesophageal groove which do not show enhancement on arterial phase and therefore more consistent with small nonpathologic lymph nodes.  She saw Dr. Harlow Asa and had right superior parathyroidectomy on 01/13/2018.  Pathology showed an adenoma of 0.25 g (1.5 x 0.9 x 0.2 cm).  Postop, calcium was normal, at 9.4, and PTH greatly decreased, at 30.  No history of CKD. Last BUN/Cr: Lab Results  Component Value Date   BUN 15 01/09/2018   BUN 21 10/07/2017   CREATININE 0.64 01/09/2018   CREATININE 0.85 10/07/2017   She does have a history of vitamin D deficient.  Last vitamin D level was normal: Lab Results  Component Value Date   VD25OH 34.95 09/09/2017   She continues ergocalciferol 50,000 units but forgets it weekly >> now monthly.  Latest TSH was reviewed and this was normal. Lab Results  Component Value Date   TSH 0.41 09/09/2017   Pt does not have a FH of hypercalcemia, pituitary tumors, thyroid cancer, or osteoporosis.  Her daughter and her mother  have had kidney stones.   Pt. also has a history of carpal tunnel surgeries bilaterally.    ROS: Constitutional: no weight gain/no weight loss, no fatigue, no subjective hyperthermia, no subjective hypothermia Eyes: no blurry vision, no xerophthalmia ENT: no sore throat, no nodules palpated in neck, no dysphagia, no odynophagia, no hoarseness Cardiovascular: no CP/no SOB/no palpitations/no leg swelling Respiratory: no cough/no SOB/no wheezing Gastrointestinal: no N/no V/no D/no C/+ acid reflux Musculoskeletal: no muscle aches/no joint aches Skin: no rashes, no hair loss Neurological: no tremors/no numbness/no tingling/no dizziness  I reviewed pt's medications, allergies, PMH, social hx, family hx, and changes were documented in the history of present illness. Otherwise, unchanged from my initial visit note.  Past Medical History:  Diagnosis Date  . Complex cyst of left ovary   . Endometrial polyp   . Heart murmur Dr Johnsie Cancel   dx 2012--  Benign--  per echo abnormal mitral inflow  . History of gastrointestinal ulcer   . IBS (irritable bowel syndrome)   . Seasonal allergies   . Wears contact lenses    Past Surgical History:  Procedure Laterality Date  . CARPAL TUNNEL RELEASE Left 05/15/2017   Procedure: LEFT CARPAL TUNNEL RELEASE;  Surgeon: [name], MD;  Location: [facility];  Service: Orthopedics;  Laterality: Left;  . COLONOSCOPY WITH PROPOFOL  last one 04-28-2014  . CYSTOSCOPY N/A 06/06/2014   Procedure: CYSTOSCOPY;  Surgeon: [name], MD;  Location: [facility];  Service: Gynecology;  Laterality: N/A;  . DILATATION & CURRETTAGE/HYSTEROSCOPY WITH RESECTOCOPE N/A 06/06/2014   Procedure: DILATATION & CURETTAGE/HYSTEROSCOPY WITH RESECTOCOPE;  Surgeon: [name], MD;  Location: [facility];  Service: Gynecology;  Laterality: N/A;  . LAPAROSCOPIC SALPINGO OOPHERECTOMY Left 06/06/2014   Procedure: LAPAROSCOPIC LEFT SALPINGO OOPHORECTOMY;  Surgeon: [name], MD;  Location: [facility];  Service:  Gynecology;  Laterality: Left;  . PARATHYROIDECTOMY Right 01/13/2018   Procedure: RIGHT SUPERIOR PARATHYROIDECTOMY;  Surgeon: Armandina Gemma, MD;  Location: [facility];  Service: General;  Laterality: Right;  . PLANTAR FASCIA SURGERY Left 2009   topaz procedure  . TRANSTHORACIC ECHOCARDIOGRAM  08-12-2007   normal LV,  ef 65-70%,  abnormal mitral inflow with trivial MR,  trivial PR and TR   Social History   Socioeconomic History  . Marital status: Married    Spouse name: Not on file  . Number of children: 2  . Years of education: Not on file  . Highest education level: Not on file  Occupational History  . Occupation: Agricultural consultant: [employer]  Social Needs  . Financial resource strain: Not on file  . Food insecurity:    Worry: Not on file    Inability: Not on file  . Transportation needs:    Medical: Not on file    Non-medical: Not  on file  Tobacco Use  . Smoking status: Never Smoker  . Smokeless tobacco: Never Used  Substance and Sexual Activity  . Alcohol use: Yes    Comment: occasional  . Drug use: Never  . Sexual activity: Not on file  Lifestyle  . Physical activity:    Days per week: Not on file    Minutes per session: Not on file  . Stress: Not on file  Relationships  . Social connections:    Talks on phone: Not on file    Gets together: Not on file    Attends religious service: Not on file    Active member of club or organization: Not on file    Attends meetings of clubs or organizations: Not on file    Relationship status: Not on file  . Intimate partner violence:    Fear of current or ex partner: Not on file    Emotionally abused: Not on file    Physically abused: Not on file    Forced sexual activity: Not on file  Other Topics Concern  . Not on file  Social History Narrative   Married, Aeronautical engineer 2 daughters   1-2 caffeinated beverages/day   Current Outpatient Medications on File Prior to Visit  Medication Sig Dispense Refill  .  azelastine (ASTELIN) 0.1 % nasal spray Place 2 sprays into both nostrils at bedtime as needed for rhinitis. 30 mL 3  . azithromycin (ZITHROMAX Z-PAK) 250 MG tablet 2 tabs a day the first day, then 1 tab a day x 4 days 6 tablet 0  . cetirizine (ZYRTEC) 10 MG tablet Take 1 tablet (10 mg total) by mouth at bedtime. 30 tablet 5  . fluticasone (FLONASE) 50 MCG/ACT nasal spray SPRAY 2 SPRAYS INTO EACH NOSTRIL EVERY DAY 50 g 0  . HYDROcodone-homatropine (HYCODAN) 5-1.5 MG/5ML syrup Take 5 mLs by mouth at bedtime as needed for cough. 120 mL 0  . Ibuprofen 200 MG CAPS Take 600 mg by mouth every 6 (six) hours as needed (pain).     . ranitidine (ZANTAC) 150 MG tablet TAKE 1 TABLET BY MOUTH TWICE A DAY (Patient taking differently: Take 150 mg by mouth daily as needed for heartburn. ) 60 tablet 2  . SUMAtriptan (IMITREX) 100 MG tablet Take 100 mg by mouth every 2 (two) hours as needed for migraine.   1  . traZODone (DESYREL) 50 MG tablet Take 1 tablet (50 mg total) by mouth at bedtime as needed for sleep. 90 tablet 1  . Vitamin D, Ergocalciferol, (DRISDOL) 1.25 MG (50000 UT) CAPS capsule TAKE ONE CAPSULE BY MOUTH ONE TIME PER WEEK 12 capsule 0   No current facility-administered medications on file prior to visit.    No Known Allergies Family History  Problem Relation Age of Onset  . Glaucoma Mother   . Hypertension Mother   . Hyperlipidemia Mother   . Heart disease Paternal Grandfather   . Tuberculosis Maternal Grandfather   . Diabetes Maternal Grandfather   . Dementia Paternal Grandmother   . Tuberculosis Other   . Colon cancer Neg Hx   . Stomach cancer Neg Hx     PE: BP 110/70   Pulse 78   Ht 5\' 2"  (1.575 m)   Wt 150 lb (68 kg)   LMP 03/30/2016   SpO2 98%   BMI 27.44 kg/m  Wt Readings from Last 3 Encounters:  05/25/18 150 lb (68 kg)  03/31/18 148 lb (67.1 kg)  03/17/18 151 lb (68.5 kg)  Constitutional: Normal weight, in NAD Eyes: PERRLA, EOMI, no exophthalmos ENT: moist mucous  membranes, no thyromegaly, no cervical lymphadenopathy Cardiovascular: RRR, No MRG Respiratory: CTA B Gastrointestinal: abdomen soft, NT, ND, BS+ Musculoskeletal: no deformities, strength intact in all 4 Skin: moist, warm, no rashes Neurological: no tremor with outstretched hands, DTR normal in all 4  Assessment: 1. Hypercalcemia/hyperparathyroidism  2.  History of vitamin D deficiency  3.  Osteopenia  4.  Thyroid nodule  Plan: Patient with history of slightly elevated calcium and also elevated PTH level.  Highest PTH was 121.  She also had a history of vitamin D deficiency and at last visit was on replacement with ergocalciferol weekly.  Latest level was normal.  At last visit, she had no apparent complications from hypercalcemia: No history of nephrolithiasis, no osteoporosis (however, she does have osteopenia), no fractures.  She denied abdominal pain, depression, bone pain.  She did have joint pains for which she was seen Dr. Tamala Julian. -At last visit, since her PTH and calcitriol levels were high, I did refer her to surgery although calcium was not elevated in blood or urine. -She had extensive imaging investigation with neck ultrasound, parathyroid scan, and 4D CT in the last test finally showed a possible parathyroid adenoma. -She had surgery on 01/13/2018 and this was successful in extracting a 0.25 g right superior adenomatous parathyroid -A calcium and PTH level obtained after surgery were normal.  We will repeat these today.  2.  History of vitamin D deficiency -Continue ergocalciferol - missed many doses... -Check vitamin D today  3.  Osteopenia -We will repeat DXA scan 2 years from the previous with hopefully improve scores after parathyroidectomy - discussed exercise - weight bearing and also given list of NOF-recommended exercises  4.  Thyroid nodule -No neck compression symptoms -We will repeat ultrasound in a year from the previous  Component     Latest Ref Rng &  Units 05/25/2018          Calcium     8.7 - 10.2 mg/dL 9.1  PTH, Intact     15 - 65 pg/mL 32  VITD     30.00 - 100.00 ng/mL 33.13   Tests are normal.  Philemon Kingdom, MD PhD [facility] Endocrinology

## 2018-05-25 NOTE — Patient Instructions (Addendum)
Please stop at the lab.  Please send me a message in 11/2018 for the ultrasound.  Please come back for a follow-up appointment in 1 year.   Exercise for Strong Bones (from [place]) There are two types of exercises that are important for building and maintaining bone density:  weight-bearing and muscle-strengthening exercises. Weight-bearing Exercises These exercises include activities that make you move against gravity while staying upright. Weight-bearing exercises can be high-impact or low-impact. High-impact weight-bearing exercises help build bones and keep them strong. If you have broken a bone due to osteoporosis or are at risk of breaking a bone, you may need to avoid high-impact exercises. If you're not sure, you should check with your healthcare provider. Examples of high-impact weight-bearing exercises are: . Dancing . Doing high-impact aerobics . Hiking . Jogging/running . Jumping Rope . Stair climbing . Tennis Low-impact weight-bearing exercises can also help keep bones strong and are a safe alternative if you cannot do high-impact exercises. Examples of low-impact weight-bearing exercises are: . Using elliptical training machines . Doing low-impact aerobics . Using stair-step machines . Fast walking on a treadmill or outside Muscle-Strengthening Exercises These exercises include activities where you move your body, a weight or some other resistance against gravity. They are also known as resistance exercises and include: . Lifting weights . Using elastic exercise bands . Using weight machines . Lifting your own body weight . Functional movements, such as standing and rising up on your toes Yoga and Pilates can also improve strength, balance and flexibility. However, certain positions may not be safe for people with osteoporosis or those at increased risk of broken bones. For example, exercises that have you bend forward may increase the chance of  breaking a bone in the spine. A physical therapist should be able to help you learn which exercises are safe and appropriate for you. Non-Impact Exercises Non-impact exercises can help you to improve balance, posture and how well you move in everyday activities. These exercises can also help to increase muscle strength and decrease the risk of falls and broken bones. Some of these exercises include: . Balance exercises that strengthen your legs and test your balance, such as Tai Chi, can decrease your risk of falls. . Posture exercises that improve your posture and reduce rounded or "sloping" shoulders can help you decrease the chance of breaking a bone, especially in the spine. . Functional exercises that improve how well you move can help you with everyday activities and decrease your chance of falling and breaking a bone. For example, if you have trouble getting up from a chair or climbing stairs, you should do these activities as exercises. A physical therapist can teach you balance, posture and functional exercises. Starting a New Exercise Program If you haven't exercised regularly for a while, check with your healthcare provider before beginning a new exercise program-particularly if you have health problems such as heart disease, diabetes or high blood pressure. If you're at high risk of breaking a bone, you should work with a physical therapist to develop a safe exercise program. Once you have your healthcare provider's approval, start slowly. If you've already broken bones in the spine because of osteoporosis, be very careful to avoid activities that require reaching down, bending forward, rapid twisting motions, heavy lifting and those that increase your chance of a fall. As you get started, your muscles may feel sore for a day or two after you exercise. If soreness lasts longer, you may be working too hard  and need to ease up. Exercises should be done in a pain-free range of motion. How Much  Exercise Do You Need? Weight-bearing exercises 30 minutes on most days of the week. Do a 30-minutesession or multiple sessions spread out throughout the day. The benefits to your bones are the same.   Muscle-strengthening exercises Two to three days per week. If you don't have much time for strengthening/resistance training, do small amounts at a time. You can do just one body part each day. For example do arms one day, legs the next and trunk the next. You can also spread these exercises out during your normal day.  Balance, posture and functional exercises Every day or as often as needed. You may want to focus on one area more than the others. If you have fallen or lose your balance, spend time doing balance exercises. If you are getting rounded shoulders, work more on posture exercises. If you have trouble climbing stairs or getting up from the couch, do more functional exercises. You can also perform these exercises at one time or spread them during your day. Work with a phyiscal therapist to learn the right exercises for you.

## 2018-05-26 LAB — PTH, INTACT AND CALCIUM
CALCIUM: 9.1 mg/dL (ref 8.7–10.2)
PTH: 32 pg/mL (ref 15–65)

## 2018-05-26 LAB — VITAMIN D 25 HYDROXY (VIT D DEFICIENCY, FRACTURES): VITD: 33.13 ng/mL (ref 30.00–100.00)

## 2018-05-27 ENCOUNTER — Encounter: Payer: Self-pay | Admitting: Internal Medicine

## 2018-05-27 ENCOUNTER — Encounter: Payer: Self-pay | Admitting: Family Medicine

## 2018-06-05 ENCOUNTER — Other Ambulatory Visit: Payer: Self-pay | Admitting: *Deleted

## 2018-06-05 MED ORDER — PREDNISONE 50 MG PO TABS: ORAL_TABLET | ORAL | 0 refills | Status: DC

## 2018-06-10 ENCOUNTER — Encounter: Payer: Self-pay | Admitting: Family Medicine

## 2018-06-10 ENCOUNTER — Ambulatory Visit (INDEPENDENT_AMBULATORY_CARE_PROVIDER_SITE_OTHER): Payer: 59 | Admitting: Family Medicine

## 2018-06-10 DIAGNOSIS — M545 Low back pain, unspecified: Secondary | ICD-10-CM

## 2018-06-10 DIAGNOSIS — G8929 Other chronic pain: Secondary | ICD-10-CM

## 2018-06-10 DIAGNOSIS — M999 Biomechanical lesion, unspecified: Secondary | ICD-10-CM

## 2018-06-10 HISTORY — DX: Biomechanical lesion, unspecified: M99.9

## 2018-06-10 MED ORDER — GABAPENTIN 100 MG PO CAPS: 200.0000 mg | ORAL_CAPSULE | Freq: Every day | ORAL | 3 refills | Status: DC

## 2018-06-10 NOTE — Progress Notes (Signed)
[name] [place] [place], [place] [address] Phone: [phone] Subjective:     CC: Back pain follow-up  TDV:[id number]  Elizabeth Meyer is a 58 y.o. female coming in with complaint of low back pain. She feels like her pain became progressively worse last week. Did finish prendisone on Tuesday. Pain has localized to right glute. Patient feels like her legs are sore and is having pain in back of right knee. Thinks she hurt her back in the gym.  Still having more right leg pain down.  More of a dull, throbbing aching pain.  Seems to keep him from activity.  Has responded well to the prednisone but is concerned that the pain may come back.     Past Medical History:  Diagnosis Date  . Complex cyst of left ovary   . Endometrial polyp   . Heart murmur Dr Johnsie Cancel   dx 2012--  Benign--  per echo abnormal mitral inflow  . History of gastrointestinal ulcer   . IBS (irritable bowel syndrome)   . Seasonal allergies   . Wears contact lenses    Past Surgical History:  Procedure Laterality Date  . CARPAL TUNNEL RELEASE Left 05/15/2017   Procedure: LEFT CARPAL TUNNEL RELEASE;  Surgeon: [name], MD;  Location: [facility];  Service: Orthopedics;  Laterality: Left;  . COLONOSCOPY WITH PROPOFOL  last one 04-28-2014  . CYSTOSCOPY N/A 06/06/2014   Procedure: CYSTOSCOPY;  Surgeon: [name], MD;  Location: [facility];  Service: Gynecology;  Laterality: N/A;  . DILATATION & CURRETTAGE/HYSTEROSCOPY WITH RESECTOCOPE N/A 06/06/2014   Procedure: DILATATION & CURETTAGE/HYSTEROSCOPY WITH RESECTOCOPE;  Surgeon: [name], MD;  Location: [facility];  Service: Gynecology;  Laterality: N/A;  . LAPAROSCOPIC SALPINGO OOPHERECTOMY Left 06/06/2014   Procedure: LAPAROSCOPIC LEFT SALPINGO OOPHORECTOMY;  Surgeon: [name], MD;  Location: [facility];  Service: Gynecology;  Laterality: Left;  . PARATHYROIDECTOMY  Right 01/13/2018   Procedure: RIGHT SUPERIOR PARATHYROIDECTOMY;  Surgeon: Armandina Gemma, MD;  Location: [facility];  Service: General;  Laterality: Right;  . PLANTAR FASCIA SURGERY Left 2009   topaz procedure  . TRANSTHORACIC ECHOCARDIOGRAM  08-12-2007   normal LV,  ef 65-70%,  abnormal mitral inflow with trivial MR,  trivial PR and TR   Social History   Socioeconomic History  . Marital status: Married    Spouse name: Not on file  . Number of children: 2  . Years of education: Not on file  . Highest education level: Not on file  Occupational History  . Occupation: Agricultural consultant: [employer]  Social Needs  . Financial resource strain: Not on file  . Food insecurity:    Worry: Not on file    Inability: Not on file  . Transportation needs:    Medical: Not on file    Non-medical: Not on file  Tobacco Use  . Smoking status: Never Smoker  . Smokeless tobacco: Never Used  Substance and Sexual Activity  . Alcohol use: Yes    Comment: occasional  . Drug use: Never  . Sexual activity: Not on file  Lifestyle  . Physical activity:    Days per week: Not on file    Minutes per session: Not on file  . Stress: Not on file  Relationships  . Social connections:    Talks on phone: Not on file    Gets together: Not on file  Attends religious service: Not on file    Active member of club or organization: Not on file    Attends meetings of clubs or organizations: Not on file    Relationship status: Not on file  Other Topics Concern  . Not on file  Social History Narrative   Married, Aeronautical engineer 2 daughters   1-2 caffeinated beverages/day   No Known Allergies Family History  Problem Relation Age of Onset  . Glaucoma Mother   . Hypertension Mother   . Hyperlipidemia Mother   . Heart disease Paternal Grandfather   . Tuberculosis Maternal Grandfather   . Diabetes Maternal Grandfather   . Dementia Paternal Grandmother   . Tuberculosis Other   . Colon cancer Neg  Hx   . Stomach cancer Neg Hx     Current Outpatient Medications (Endocrine & Metabolic):  .  predniSONE (DELTASONE) 50 MG tablet, Take 1 tablet daily.   Current Outpatient Medications (Respiratory):  .  azelastine (ASTELIN) 0.1 % nasal spray, Place 2 sprays into both nostrils at bedtime as needed for rhinitis. Marland Kitchen  cetirizine (ZYRTEC) 10 MG tablet, Take 1 tablet (10 mg total) by mouth at bedtime. .  fluticasone (FLONASE) 50 MCG/ACT nasal spray, SPRAY 2 SPRAYS INTO EACH NOSTRIL EVERY DAY  Current Outpatient Medications (Analgesics):  Marland Kitchen  SUMAtriptan (IMITREX) 100 MG tablet, Take 100 mg by mouth every 2 (two) hours as needed for migraine.    Current Outpatient Medications (Other):  .  gabapentin (NEURONTIN) 100 MG capsule, Take 2 capsules (200 mg total) by mouth at bedtime. .  ranitidine (ZANTAC) 150 MG tablet, TAKE 1 TABLET BY MOUTH TWICE A DAY (Patient taking differently: Take 150 mg by mouth daily as needed for heartburn. ) .  traZODone (DESYREL) 50 MG tablet, Take 1 tablet (50 mg total) by mouth at bedtime as needed for sleep. .  Vitamin D, Ergocalciferol, (DRISDOL) 1.25 MG (50000 UT) CAPS capsule, TAKE ONE CAPSULE BY MOUTH ONE TIME PER WEEK    Past medical history, social, surgical and family history all reviewed in electronic medical record.  No pertanent information unless stated regarding to the chief complaint.   Review of Systems:  No headache, visual changes, nausea, vomiting, diarrhea, constipation, dizziness, abdominal pain, skin rash, fevers, chills, night sweats, weight loss, swollen lymph nodes, body aches, joint swelling,  chest pain, shortness of breath, mood changes.  Positive muscle aches  Objective  Blood pressure (!) 142/84, pulse 83, height 5\' 2"  (1.575 m), weight 152 lb (68.9 kg), last menstrual period 03/30/2016, SpO2 98 %.    General: No apparent distress alert and oriented x3 mood and affect normal, dressed appropriately.  HEENT: Pupils equal, extraocular  movements intact  Respiratory: Patient's speak in full sentences and does not appear short of breath  Cardiovascular: No lower extremity edema, non tender, no erythema  Skin: Warm dry intact with no signs of infection or rash on extremities or on axial skeleton.  Abdomen: Soft nontender  Neuro: Cranial nerves II through XII are intact, neurovascularly intact in all extremities with 2+ DTRs and 2+ pulses.  Lymph: No lymphadenopathy of posterior or anterior cervical chain or axillae bilaterally.  Gait normal with good balance and coordination.  MSK:  Non tender with full range of motion and good stability and symmetric strength and tone of shoulders, elbows, wrist, hip, knee and ankles bilaterally.  Back Exam:  Inspection: Mild loss of lordosis Motion: Flexion 35 deg, Extension 25 deg, Side Bending to 35 deg bilaterally,  Rotation  to 35 deg bilaterally  SLR laying: Tightness of the right side XSLR laying: Negative  Palpable tenderness: Tender to palpation of the paraspinal musculature lumbar spine right greater than left. FABER: negative. Sensory change: Gross sensation intact to all lumbar and sacral dermatomes.  Reflexes: 2+ at both patellar tendons, 2+ at achilles tendons, Babinski's downgoing.  Strength at foot  Plantar-flexion: 5/5 Dorsi-flexion: 5/5 Eversion: 5/5 Inversion: 5/5  Leg strength  Quad: 5/5 Hamstring: 5/5 Hip flexor: 5/5 Hip abductors: 5/5    Osteopathic findings C2 flexed rotated and side bent right C6 flexed rotated and side bent left T3 extended rotated and side bent right inhaled third rib L2 flexed rotated and side bent right Sacrum right on right      Impression and Recommendations:     This case required medical decision making of moderate complexity. The above documentation has been reviewed and is accurate and complete Lyndal Pulley, DO       Note: This dictation was prepared with Dragon dictation along with smaller phrase technology. Any  transcriptional errors that result from this process are unintentional.

## 2018-06-10 NOTE — Patient Instructions (Signed)
Good to see you  Ice is your friend Stay active Exercises 3 times a week.  OK to do machines with back support Gabapentin 200mg  at night Tried manipulation today and should help some See me again in 4 weeks to make sure much better

## 2018-06-10 NOTE — Assessment & Plan Note (Signed)
Low back .  Concern for potential small herniated disc.  Patient does not have a positive straight leg at this time that is concerning.  Attempted osteopathic manipulation.  Discussed icing regimen and home exercise.  Has had x-rays previously that were independently visualized by me showing no significant bony abnormality.  Discussed icing regimen.  Patient responded well to manipulation and will work on hip abductor strengthening and range of motion exercises.  Follow-up again in 4 to 8 weeks

## 2018-06-10 NOTE — Assessment & Plan Note (Signed)
Decision today to treat with OMT was based on Physical Exam  After verbal consent patient was treated with HVLA, ME, FPR techniques in cervical, thoracic, rib,  lumbar and sacral areas  Patient tolerated the procedure well with improvement in symptoms  Patient given exercises, stretches and lifestyle modifications  See medications in patient instructions if given  Patient will follow up in 4-8 weeks 

## 2018-06-11 DIAGNOSIS — Z1231 Encounter for screening mammogram for malignant neoplasm of breast: Secondary | ICD-10-CM | POA: Diagnosis not present

## 2018-06-15 ENCOUNTER — Encounter: Payer: Self-pay | Admitting: Family Medicine

## 2018-07-02 ENCOUNTER — Other Ambulatory Visit: Payer: Self-pay | Admitting: Family Medicine

## 2018-07-08 ENCOUNTER — Ambulatory Visit: Payer: 59 | Admitting: Family Medicine

## 2018-08-05 NOTE — Progress Notes (Signed)
[facility] [address] [place], [place] [address] Phone: [phone] Subjective:   I Elizabeth Meyer am serving as a Education administrator for Dr. Hulan Saas.   CC: Bilateral knee pain  [id number]  Elizabeth Meyer is a 58 y.o. female coming in with complaint of bilateral knee pain. States that she would like injections. Back is also still bothering her.  Patient has responded to injections previously.  Patient does have patellofemoral syndrome of the knees bilaterally.  Patient says some lateral increase and discomfort especially going up and down stairs or squatting a lot such as doing gardening.     Past Medical History:  Diagnosis Date  . Complex cyst of left ovary   . Endometrial polyp   . Heart murmur Dr Johnsie Cancel   dx 2012--  Benign--  per echo abnormal mitral inflow  . History of gastrointestinal ulcer   . IBS (irritable bowel syndrome)   . Seasonal allergies   . Wears contact lenses    Past Surgical History:  Procedure Laterality Date  . CARPAL TUNNEL RELEASE Left 05/15/2017   Procedure: LEFT CARPAL TUNNEL RELEASE;  Surgeon: [name], MD;  Location: [facility];  Service: Orthopedics;  Laterality: Left;  . COLONOSCOPY WITH PROPOFOL  last one 04-28-2014  . CYSTOSCOPY N/A 06/06/2014   Procedure: CYSTOSCOPY;  Surgeon: [name], MD;  Location: [facility];  Service: Gynecology;  Laterality: N/A;  . DILATATION & CURRETTAGE/HYSTEROSCOPY WITH RESECTOCOPE N/A 06/06/2014   Procedure: DILATATION & CURETTAGE/HYSTEROSCOPY WITH RESECTOCOPE;  Surgeon: [name], MD;  Location: [facility];  Service: Gynecology;  Laterality: N/A;  . LAPAROSCOPIC SALPINGO OOPHERECTOMY Left 06/06/2014   Procedure: LAPAROSCOPIC LEFT SALPINGO OOPHORECTOMY;  Surgeon: [name], MD;  Location: [facility];  Service: Gynecology;  Laterality: Left;  . PARATHYROIDECTOMY Right 01/13/2018   Procedure: RIGHT SUPERIOR  PARATHYROIDECTOMY;  Surgeon: Armandina Gemma, MD;  Location: [facility];  Service: General;  Laterality: Right;  . PLANTAR FASCIA SURGERY Left 2009   topaz procedure  . TRANSTHORACIC ECHOCARDIOGRAM  08-12-2007   normal LV,  ef 65-70%,  abnormal mitral inflow with trivial MR,  trivial PR and TR   Social History   Socioeconomic History  . Marital status: Married    Spouse name: Not on file  . Number of children: 2  . Years of education: Not on file  . Highest education level: Not on file  Occupational History  . Occupation: Agricultural consultant: [employer]  Social Needs  . Financial resource strain: Not on file  . Food insecurity:    Worry: Not on file    Inability: Not on file  . Transportation needs:    Medical: Not on file    Non-medical: Not on file  Tobacco Use  . Smoking status: Never Smoker  . Smokeless tobacco: Never Used  Substance and Sexual Activity  . Alcohol use: Yes    Comment: occasional  . Drug use: Never  . Sexual activity: Not on file  Lifestyle  . Physical activity:    Days per week: Not on file    Minutes per session: Not on file  . Stress: Not on file  Relationships  . Social connections:    Talks on phone: Not on file    Gets together: Not on file    Attends religious service: Not on file    Active member of club or organization: Not on file  Attends meetings of clubs or organizations: Not on file    Relationship status: Not on file  Other Topics Concern  . Not on file  Social History Narrative   Married, Aeronautical engineer 2 daughters   1-2 caffeinated beverages/day   No Known Allergies Family History  Problem Relation Age of Onset  . Glaucoma Mother   . Hypertension Mother   . Hyperlipidemia Mother   . Heart disease Paternal Grandfather   . Tuberculosis Maternal Grandfather   . Diabetes Maternal Grandfather   . Dementia Paternal Grandmother   . Tuberculosis Other   . Colon cancer Neg Hx   . Stomach cancer Neg Hx     Current  Outpatient Medications (Endocrine & Metabolic):  .  predniSONE (DELTASONE) 50 MG tablet, Take 1 tablet daily.   Current Outpatient Medications (Respiratory):  .  azelastine (ASTELIN) 0.1 % nasal spray, Place 2 sprays into both nostrils at bedtime as needed for rhinitis. Marland Kitchen  cetirizine (ZYRTEC) 10 MG tablet, Take 1 tablet (10 mg total) by mouth at bedtime. .  fluticasone (FLONASE) 50 MCG/ACT nasal spray, SPRAY 2 SPRAYS INTO EACH NOSTRIL EVERY DAY  Current Outpatient Medications (Analgesics):  Marland Kitchen  SUMAtriptan (IMITREX) 100 MG tablet, Take 100 mg by mouth every 2 (two) hours as needed for migraine.    Current Outpatient Medications (Other):  .  gabapentin (NEURONTIN) 100 MG capsule, TAKE 2 CAPSULES (200 MG TOTAL) BY MOUTH AT BEDTIME. .  ranitidine (ZANTAC) 150 MG tablet, TAKE 1 TABLET BY MOUTH TWICE A DAY (Patient taking differently: Take 150 mg by mouth daily as needed for heartburn. ) .  Vitamin D, Ergocalciferol, (DRISDOL) 1.25 MG (50000 UT) CAPS capsule, TAKE ONE CAPSULE BY MOUTH ONE TIME PER WEEK .  traZODone (DESYREL) 100 MG tablet, Take 1 tablet (100 mg total) by mouth at bedtime as needed for sleep.    Past medical history, social, surgical and family history all reviewed in electronic medical record.  No pertanent information unless stated regarding to the chief complaint.   Review of Systems:  No headache, visual changes, nausea, vomiting, diarrhea, constipation, dizziness, abdominal pain, skin rash, fevers, chills, night sweats, weight loss, swollen lymph nodes, body aches, joint swelling, chest pain, shortness of breath, mood changes.  Positive muscle aches Objective  Blood pressure 126/74, pulse 87, height 5\' 2"  (1.575 m), weight 149 lb (67.6 kg), last menstrual period 03/30/2016, SpO2 96 %.    General: No apparent distress alert and oriented x3 mood and affect normal, dressed appropriately.  HEENT: Pupils equal, extraocular movements intact  Respiratory: Patient's speak in  full sentences and does not appear short of breath  Cardiovascular: No lower extremity edema, non tender, no erythema  Skin: Warm dry intact with no signs of infection or rash on extremities or on axial skeleton.  Abdomen: Soft nontender  Neuro: Cranial nerves II through XII are intact, neurovascularly intact in all extremities with 2+ DTRs and 2+ pulses.  Lymph: No lymphadenopathy of posterior or anterior cervical chain or axillae bilaterally.  Gait normal with good balance and coordination.  MSK:  Non tender with full range of motion and good stability and symmetric strength and tone of shoulders, elbows, wrist, hip and ankles bilaterally.   bilateral Knee exam shows positive grind the patella.  Nontender to palpation concerning.  Mild pain in the medial joint space as well.  No significant instability  After informed written and verbal consent, patient was seated on exam table. Right knee was prepped with alcohol swab  and utilizing anterolateral approach, patient's right knee space was injected with 4:1  marcaine 0.5%: Kenalog 40mg /dL. Patient tolerated the procedure well without immediate complications.  After informed written and verbal consent, patient was seated on exam table. Left knee was prepped with alcohol swab and utilizing anterolateral approach, patient's left knee space was injected with 4:1  marcaine 0.5%: Kenalog 40mg /dL. Patient tolerated the procedure well without immediate complications.    Impression and Recommendations:     This case required medical decision making of moderate complexity. The above documentation has been reviewed and is accurate and complete Lyndal Pulley, DO       Note: This dictation was prepared with Dragon dictation along with smaller phrase technology. Any transcriptional errors that result from this process are unintentional.

## 2018-08-06 ENCOUNTER — Ambulatory Visit (INDEPENDENT_AMBULATORY_CARE_PROVIDER_SITE_OTHER): Payer: 59 | Admitting: Family Medicine

## 2018-08-06 ENCOUNTER — Other Ambulatory Visit: Payer: Self-pay

## 2018-08-06 ENCOUNTER — Encounter: Payer: Self-pay | Admitting: Family Medicine

## 2018-08-06 DIAGNOSIS — M222X2 Patellofemoral disorders, left knee: Secondary | ICD-10-CM | POA: Diagnosis not present

## 2018-08-06 DIAGNOSIS — M222X1 Patellofemoral disorders, right knee: Secondary | ICD-10-CM | POA: Diagnosis not present

## 2018-08-06 MED ORDER — TRAZODONE HCL 100 MG PO TABS: 100.0000 mg | ORAL_TABLET | Freq: Every evening | ORAL | 1 refills | Status: DC | PRN

## 2018-08-06 NOTE — Patient Instructions (Addendum)
Good to see you  Ice is your friend Lets see how the shots do and will get approval  Increase gabapentin 300mg  at night Trazadone 100mg  at night and sent in prescription  See me again in 4 week

## 2018-08-06 NOTE — Assessment & Plan Note (Signed)
Bilateral knee pain.  Repeat injections today.  Discussed icing regimen and home exercise.  Discussed the possibility of Visco supplementation.  Patient will consider it.  Discussed icing regimen.  Follow-up again 4 weeks

## 2018-08-20 IMAGING — DX DG LUMBAR SPINE COMPLETE 4+V
5 series · 5 of 5 positions shown · non-contrast
Comparison: None.

CLINICAL DATA: Lumbar spine/low back pain..

EXAM:
LUMBAR SPINE - COMPLETE 4+ VIEW

[l-spine ap]
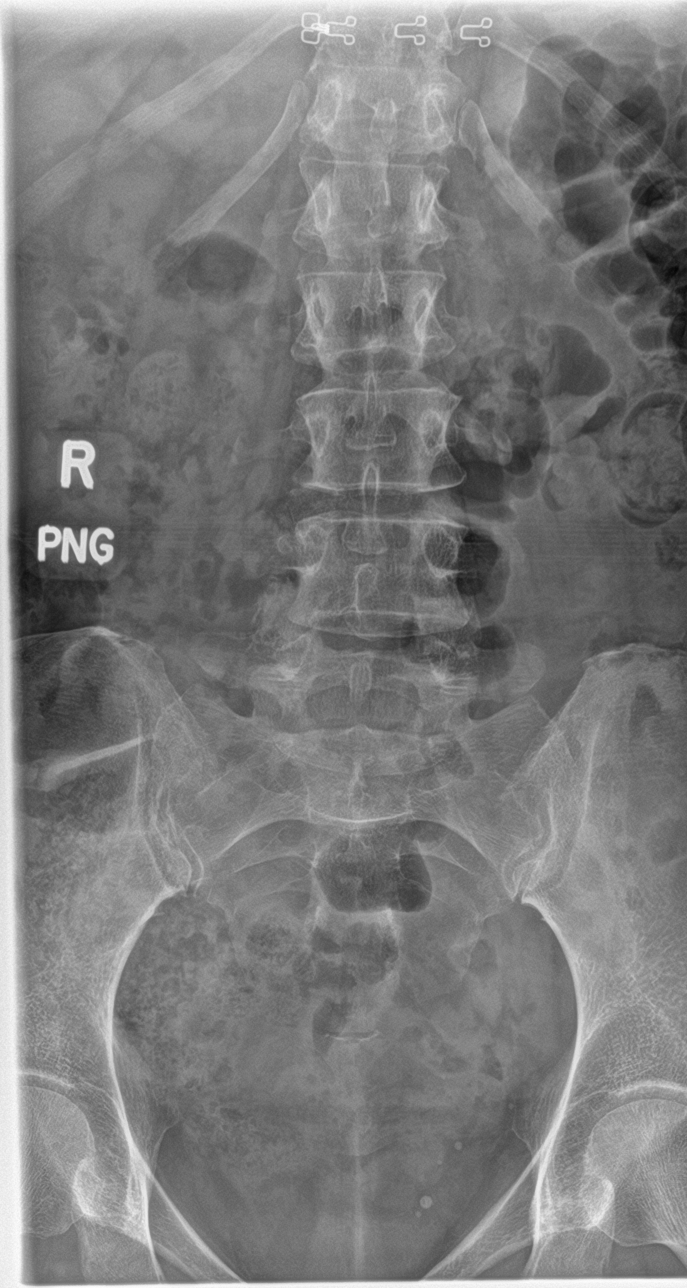

[l-spine obl (1 of 2)]
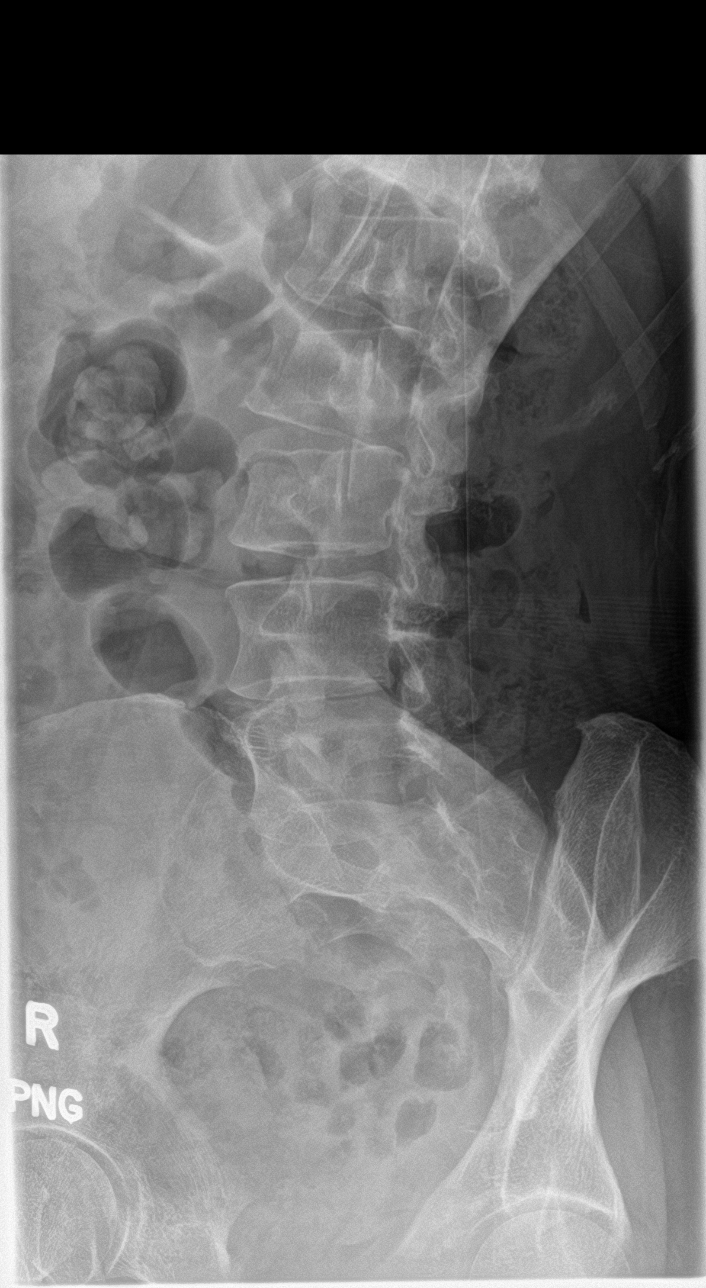

[l-spine obl (2 of 2)]
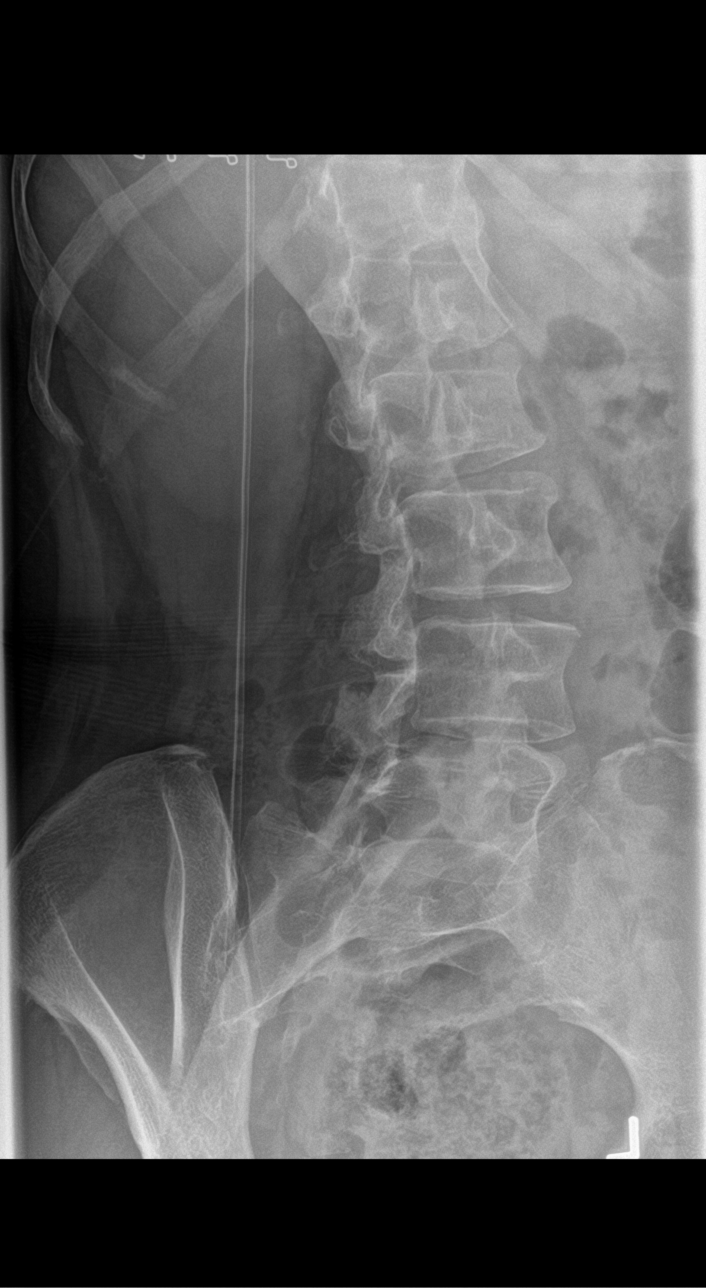

[l-spine lat]
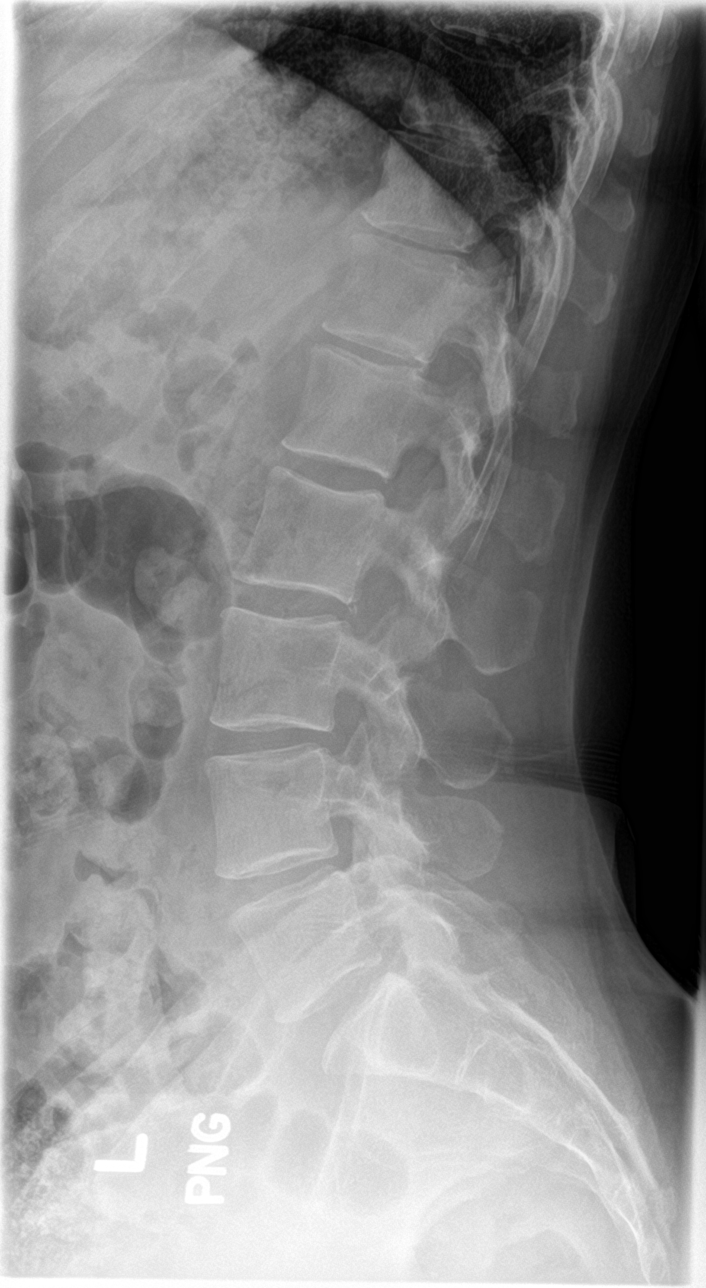

[l-spine spot]
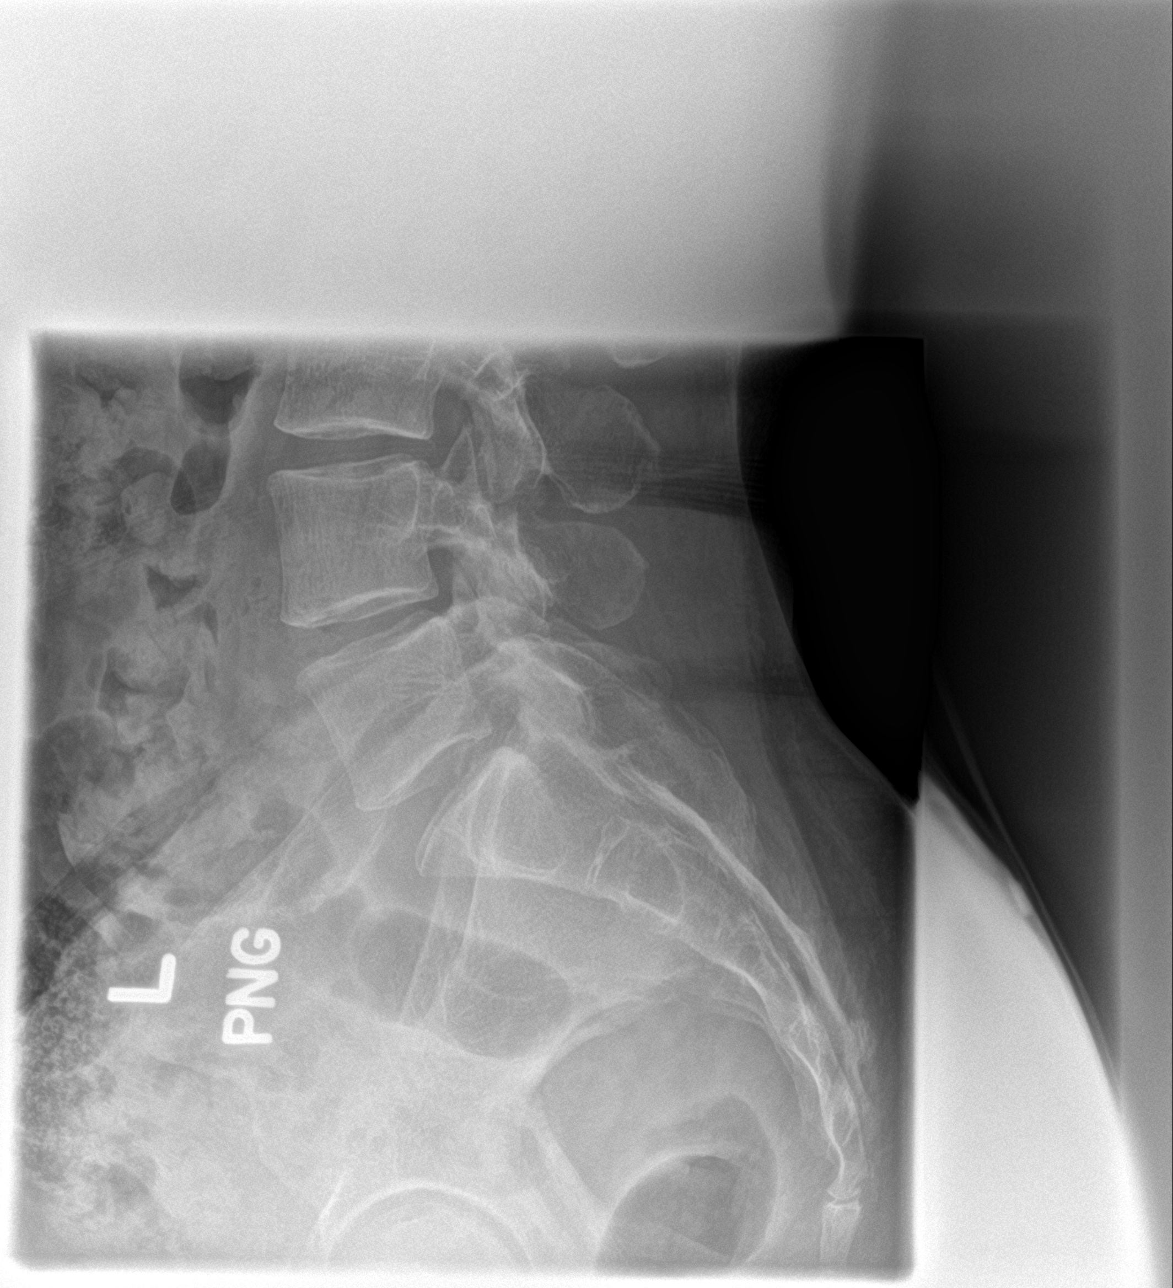

[5 of 5 positions shown; findings below may reference images not displayed]

FINDINGS: There is no evidence of lumbar spine fracture. Alignment is normal.
Intervertebral disc spaces are maintained.
IMPRESSION: Negative.

## 2018-09-02 DIAGNOSIS — Z01419 Encounter for gynecological examination (general) (routine) without abnormal findings: Secondary | ICD-10-CM | POA: Diagnosis not present

## 2018-09-02 DIAGNOSIS — Z6827 Body mass index (BMI) 27.0-27.9, adult: Secondary | ICD-10-CM | POA: Diagnosis not present

## 2018-11-16 ENCOUNTER — Other Ambulatory Visit: Payer: Self-pay | Admitting: Surgery

## 2018-11-16 DIAGNOSIS — E041 Nontoxic single thyroid nodule: Secondary | ICD-10-CM

## 2018-11-19 ENCOUNTER — Ambulatory Visit
Admission: RE | Admit: 2018-11-19 | Discharge: 2018-11-19 | Disposition: A | Discharge status: Home / Self Care | Payer: 59 | Source: Ambulatory Visit | Attending: Surgery | Admitting: Surgery

## 2018-11-19 DIAGNOSIS — E041 Nontoxic single thyroid nodule: Secondary | ICD-10-CM

## 2018-11-25 ENCOUNTER — Other Ambulatory Visit: Payer: Self-pay | Admitting: Family Medicine

## 2019-01-14 ENCOUNTER — Encounter: Payer: Self-pay | Admitting: Family Medicine

## 2019-01-28 ENCOUNTER — Ambulatory Visit (INDEPENDENT_AMBULATORY_CARE_PROVIDER_SITE_OTHER): Payer: 59 | Admitting: Family Medicine

## 2019-01-28 ENCOUNTER — Other Ambulatory Visit: Payer: Self-pay

## 2019-01-28 ENCOUNTER — Encounter: Payer: Self-pay | Admitting: Family Medicine

## 2019-01-28 DIAGNOSIS — M170 Bilateral primary osteoarthritis of knee: Secondary | ICD-10-CM

## 2019-01-28 DIAGNOSIS — M222X1 Patellofemoral disorders, right knee: Secondary | ICD-10-CM | POA: Diagnosis not present

## 2019-01-28 DIAGNOSIS — M222X2 Patellofemoral disorders, left knee: Secondary | ICD-10-CM | POA: Diagnosis not present

## 2019-01-28 NOTE — Assessment & Plan Note (Signed)
Injection given.  Discussed icing regimen, home exercise, which activities to do which wants to avoid.  Discussed icing regimen.  Follow-up again in 4 to 8 weeks

## 2019-01-28 NOTE — Patient Instructions (Signed)
Let us know when you need [place].

## 2019-01-28 NOTE — Progress Notes (Addendum)
[name] [place], [place] [address] Phone: [phone] Subjective:    I'm seeing this patient by the request  of:    CC: B knee pian  [id number]    I, [name], LAT, ATC am serving as scribe for Dr. Hulan Saas.  08/06/18: Bilateral knee pain.  Repeat injections today.  Discussed icing regimen and home exercise.  Discussed the possibility of Visco supplementation.  Patient will consider it.  Discussed icing regimen.  Follow-up again 4 weeks  Update- 01/28/19 Elizabeth Meyer is a 58 y.o. female coming in with complaint of B knee pain.  Pt states that she did well w/ her last injections but reports that her knee pain has increased over the last month.  She reports aching pain in her B knees.      Past Medical History:  Diagnosis Date  . Complex cyst of left ovary   . Endometrial polyp   . Heart murmur Dr Johnsie Cancel   dx 2012--  Benign--  per echo abnormal mitral inflow  . History of gastrointestinal ulcer   . IBS (irritable bowel syndrome)   . Seasonal allergies   . Wears contact lenses    Past Surgical History:  Procedure Laterality Date  . CARPAL TUNNEL RELEASE Left 05/15/2017   Procedure: LEFT CARPAL TUNNEL RELEASE;  Surgeon: [name], MD;  Location: [facility];  Service: Orthopedics;  Laterality: Left;  . COLONOSCOPY WITH PROPOFOL  last one 04-28-2014  . CYSTOSCOPY N/A 06/06/2014   Procedure: CYSTOSCOPY;  Surgeon: [name], MD;  Location: [facility];  Service: Gynecology;  Laterality: N/A;  . DILATATION & CURRETTAGE/HYSTEROSCOPY WITH RESECTOCOPE N/A 06/06/2014   Procedure: DILATATION & CURETTAGE/HYSTEROSCOPY WITH RESECTOCOPE;  Surgeon: [name], MD;  Location: [facility];  Service: Gynecology;  Laterality: N/A;  . LAPAROSCOPIC SALPINGO OOPHERECTOMY Left 06/06/2014   Procedure: LAPAROSCOPIC LEFT SALPINGO OOPHORECTOMY;  Surgeon: [name], MD;  Location: [facility];  Service: Gynecology;  Laterality: Left;  . PARATHYROIDECTOMY Right 01/13/2018   Procedure: RIGHT SUPERIOR PARATHYROIDECTOMY;  Surgeon: Armandina Gemma, MD;  Location: [facility];  Service: General;  Laterality: Right;  . PLANTAR FASCIA SURGERY Left 2009   topaz procedure  . TRANSTHORACIC ECHOCARDIOGRAM  08-12-2007   normal LV,  ef 65-70%,  abnormal mitral inflow with trivial MR,  trivial PR and TR   Social History   Socioeconomic History  . Marital status: Married    Spouse name: Not on file  . Number of children: 2  . Years of education: Not on file  . Highest education level: Not on file  Occupational History  . Occupation: Agricultural consultant: [employer]  Social Needs  . Financial resource strain: Not on file  . Food insecurity    Worry: Not on file    Inability: Not on file  . Transportation needs    Medical: Not on file    Non-medical: Not on file  Tobacco Use  . Smoking status: Never Smoker  . Smokeless tobacco: Never Used  Substance and Sexual Activity  . Alcohol use: Yes    Comment: occasional  . Drug use: Never  . Sexual activity: Not on file  Lifestyle  . Physical activity    Days per week: Not on file    Minutes per session: Not on file  . Stress: Not on file  Relationships  . Social connections    Talks  on phone: Not on file    Gets together: Not on file    Attends religious service: Not on file    Active member of club or organization: Not on file    Attends meetings of clubs or organizations: Not on file    Relationship status: Not on file  Other Topics Concern  . Not on file  Social History Narrative   Married, Aeronautical engineer 2 daughters   1-2 caffeinated beverages/day   No Known Allergies Family History  Problem Relation Age of Onset  . Glaucoma Mother   . Hypertension Mother   . Hyperlipidemia Mother   . Heart disease Paternal Grandfather   . Tuberculosis Maternal Grandfather   . Diabetes Maternal Grandfather    . Dementia Paternal Grandmother   . Tuberculosis Other   . Colon cancer Neg Hx   . Stomach cancer Neg Hx     Current Outpatient Medications (Endocrine & Metabolic):  .  predniSONE (DELTASONE) 50 MG tablet, Take 1 tablet daily.   Current Outpatient Medications (Respiratory):  .  azelastine (ASTELIN) 0.1 % nasal spray, Place 2 sprays into both nostrils at bedtime as needed for rhinitis. Marland Kitchen  cetirizine (ZYRTEC) 10 MG tablet, Take 1 tablet (10 mg total) by mouth at bedtime. .  fluticasone (FLONASE) 50 MCG/ACT nasal spray, SPRAY 2 SPRAYS INTO EACH NOSTRIL EVERY DAY  Current Outpatient Medications (Analgesics):  Marland Kitchen  SUMAtriptan (IMITREX) 100 MG tablet, Take 100 mg by mouth every 2 (two) hours as needed for migraine.    Current Outpatient Medications (Other):  .  gabapentin (NEURONTIN) 100 MG capsule, TAKE 2 CAPSULES (200 MG TOTAL) BY MOUTH AT BEDTIME. .  ranitidine (ZANTAC) 150 MG tablet, TAKE 1 TABLET BY MOUTH TWICE A DAY (Patient taking differently: Take 150 mg by mouth daily as needed for heartburn. ) .  traZODone (DESYREL) 100 MG tablet, Take 1 tablet (100 mg total) by mouth at bedtime as needed for sleep. .  Vitamin D, Ergocalciferol, (DRISDOL) 1.25 MG (50000 UT) CAPS capsule, TAKE 1 CAPSULE BY MOUTH ONCE A WEEK    Past medical history, social, surgical and family history all reviewed in electronic medical record.  No pertanent information unless stated regarding to the chief complaint.   Review of Systems:  No headache, visual changes, nausea, vomiting, diarrhea, constipation, dizziness, abdominal pain, skin rash, fevers, chills, night sweats, weight loss, swollen lymph nodes, body aches, joint swelling, muscle aches, chest pain, shortness of breath, mood changes.   Objective  Last menstrual period 03/30/2016. Systems examined below as of    General: No apparent distress alert and oriented x3 mood and affect normal, dressed appropriately.  HEENT: Pupils equal, extraocular  movements intact  Respiratory: Patient's speak in full sentences and does not appear short of breath  Cardiovascular: No lower extremity edema, non tender, no erythema  Skin: Warm dry intact with no signs of infection or rash on extremities or on axial skeleton.  Abdomen: Soft nontender  Neuro: Cranial nerves II through XII are intact, neurovascularly intact in all extremities with 2+ DTRs and 2+ pulses.  Lymph: No lymphadenopathy of posterior or anterior cervical chain or axillae bilaterally.  Gait normal with good balance and coordination.  MSK:  Non tender with full range of motion and good stability and symmetric strength and tone of shoulders, elbows, wrist, hip and ankles bilaterally.  Bilateral knees do have some positive patellar grinding.  Aching syndrome with range of motion.  Good stability no still noted of the LCL and  MCL.  Negative McMurray's  After informed written and verbal consent, patient was seated on exam table. Right knee was prepped with alcohol swab and utilizing anterolateral approach, patient's right knee space was injected with 60 mg per 3 mL of Durolane (sodium hyaluronate) in a prefilled syringe was injected easily into the knee through a 22-gauge needle..Patient tolerated the procedure well without immediate complications.  After informed written and verbal consent, patient was seated on exam table. Left knee was prepped with alcohol swab and utilizing anterolateral approach, patient's left knee space was injected with 60 mg per 3 mL of Durolane c(sodium hyaluronate) in a prefilled syringe was injected easily into the knee through a 22-gauge needle..Patient tolerated the procedure well without immediate complications.     Original note had wrong medication.  Addendum was done on May 03, 2019.  Addendum again today to readdress update for correct medication.  Today's date is June 03, 2019 Impression and Recommendations:     This case required medical decision  making of moderate complexity. The above documentation has been reviewed and is accurate and complete Lyndal Pulley, DO       Note: This dictation was prepared with Dragon dictation along with smaller phrase technology. Any transcriptional errors that result from this process are unintentional.

## 2019-01-29 ENCOUNTER — Encounter: Payer: Self-pay | Admitting: Family Medicine

## 2019-01-29 ENCOUNTER — Ambulatory Visit (INDEPENDENT_AMBULATORY_CARE_PROVIDER_SITE_OTHER): Payer: 59 | Admitting: Family Medicine

## 2019-01-29 VITALS — BP 130/88 | HR 67 | Temp 97.8°F | Resp 18 | Ht 62.0 in | Wt 154.4 lb

## 2019-01-29 DIAGNOSIS — I10 Essential (primary) hypertension: Secondary | ICD-10-CM | POA: Insufficient documentation

## 2019-01-29 DIAGNOSIS — M2540 Effusion, unspecified joint: Secondary | ICD-10-CM

## 2019-01-29 DIAGNOSIS — Z23 Encounter for immunization: Secondary | ICD-10-CM

## 2019-01-29 HISTORY — DX: Essential (primary) hypertension: I10

## 2019-01-29 MED ORDER — LISINOPRIL-HYDROCHLOROTHIAZIDE 10-12.5 MG PO TABS: 1.0000 | ORAL_TABLET | Freq: Every day | ORAL | 2 refills | Status: DC

## 2019-01-29 NOTE — Progress Notes (Signed)
Patient ID: Elizabeth Meyer, female    DOB: 01/11/1961  Age: 58 y.o. MRN: [id number]    Subjective:  Subjective  HPI Elizabeth Meyer presents for high bp -- its been high in other drs offices and at home  No headaches, cp or sob.   Pt brought in a list of home bp that were borderline to high --scanned in epic Pt also c/o swelling in her hands -- jewelry tight No other complaints Review of Systems  Constitutional: Negative for appetite change, diaphoresis, fatigue and unexpected weight change.  Eyes: Negative for pain, redness and visual disturbance.  Respiratory: Negative for cough, chest tightness, shortness of breath and wheezing.   Cardiovascular: Negative for chest pain, palpitations and leg swelling.  Endocrine: Negative for cold intolerance, heat intolerance, polydipsia, polyphagia and polyuria.  Genitourinary: Negative for difficulty urinating, dysuria and frequency.  Neurological: Negative for dizziness, light-headedness, numbness and headaches.    History Past Medical History:  Diagnosis Date  . Complex cyst of left ovary   . Endometrial polyp   . Heart murmur Dr Johnsie Meyer   dx 2012--  Benign--  per echo abnormal mitral inflow  . History of gastrointestinal ulcer   . IBS (irritable bowel syndrome)   . Seasonal allergies   . Wears contact lenses     She has a past surgical history that includes transthoracic echocardiogram (08-12-2007); Plantar fascia surgery (Left, 2009); Colonoscopy with propofol (last one 04-28-2014); Dilatation & currettage/hysteroscopy with resectoscope (N/A, 06/06/2014); Laparoscopic salpingo oophorectomy (Left, 06/06/2014); Cystoscopy (N/A, 06/06/2014); Carpal tunnel release (Left, 05/15/2017); and Parathyroidectomy (Right, 01/13/2018).   Her family history includes Dementia in her paternal grandmother; Diabetes in her maternal grandfather; Glaucoma in her mother; Heart disease in her paternal grandfather; Hyperlipidemia in her mother; Hypertension in her  mother; Tuberculosis in her maternal grandfather and another family member.She reports that she has never smoked. She has never used smokeless tobacco. She reports current alcohol use. She reports that she does not use drugs.  Current Outpatient Medications on File Prior to Visit  Medication Sig Dispense Refill  . cetirizine (ZYRTEC) 10 MG tablet Take 1 tablet (10 mg total) by mouth at bedtime. 30 tablet 5  . gabapentin (NEURONTIN) 100 MG capsule TAKE 2 CAPSULES (200 MG TOTAL) BY MOUTH AT BEDTIME. 180 capsule 2  . naproxen sodium (ALEVE) 220 MG tablet Take 220 mg by mouth daily as needed.    . ranitidine (ZANTAC) 150 MG tablet TAKE 1 TABLET BY MOUTH TWICE A DAY (Patient taking differently: Take 150 mg by mouth daily as needed for heartburn. ) 60 tablet 2  . SUMAtriptan (IMITREX) 100 MG tablet Take 100 mg by mouth every 2 (two) hours as needed for migraine.   1  . traZODone (DESYREL) 100 MG tablet Take 1 tablet (100 mg total) by mouth at bedtime as needed for sleep. 90 tablet 1  . Vitamin D, Ergocalciferol, (DRISDOL) 1.25 MG (50000 UT) CAPS capsule TAKE 1 CAPSULE BY MOUTH ONCE A WEEK 12 capsule 0   No current facility-administered medications on file prior to visit.      Objective:  Objective  Physical Exam Vitals signs and nursing note reviewed.  Constitutional:      Appearance: She is well-developed.  HENT:     Head: Normocephalic and atraumatic.  Eyes:     Conjunctiva/sclera: Conjunctivae normal.  Neck:     Musculoskeletal: Normal range of motion and neck supple.     Thyroid: No thyromegaly.     Vascular: No  carotid bruit or JVD.  Cardiovascular:     Rate and Rhythm: Normal rate and regular rhythm.     Heart sounds: Normal heart sounds. No murmur.  Pulmonary:     Effort: Pulmonary effort is normal. No respiratory distress.     Breath sounds: Normal breath sounds. No wheezing or rales.  Chest:     Chest wall: No tenderness.  Neurological:     Mental Status: She is alert and  oriented to person, place, and time.    BP 130/88   Pulse 67   Temp 97.8 F (36.6 C) (Temporal)   Resp 18   Ht 5\' 2"  (1.575 m)   Wt 154 lb 6.4 oz (70 kg)   LMP 03/30/2016   SpO2 98%   BMI 28.24 kg/m  Wt Readings from Last 3 Encounters:  01/29/19 154 lb 6.4 oz (70 kg)  01/28/19 153 lb 6.4 oz (69.6 kg)  08/06/18 149 lb (67.6 kg)     Lab Results  Component Value Date   WBC 6.4 01/09/2018   HGB 12.4 01/09/2018   HCT 37.3 01/09/2018   PLT 329 01/09/2018   GLUCOSE 95 01/09/2018   ALT 15 09/09/2017   AST 14 09/09/2017   NA 145 01/09/2018   K 3.6 01/09/2018   CL 111 01/09/2018   CREATININE 0.64 01/09/2018   BUN 15 01/09/2018   CO2 28 01/09/2018   TSH 0.41 09/09/2017    US Thyroid  Result Date: 11/20/2018 CLINICAL DATA:  Thyroid nodules EXAM: THYROID ULTRASOUND TECHNIQUE: Ultrasound examination of the thyroid gland and adjacent soft tissues was performed. COMPARISON:  11/13/2017 FINDINGS: Parenchymal Echotexture: Mildly heterogenous Isthmus: 4 mm Right lobe: 4.9 x 1.6 x 1.5 cm, previously 4.5 x 1.7 x 1.3 cm Left lobe: 4.4 x 1.3 x 1.2 cm, previously 4.5 x 1.7 x 1.2 cm _________________________________________________________ Estimated total number of nodules >/= 1 cm: 0 Number of spongiform nodules >/=  2 cm not described below (TR1): 0 Number of mixed cystic and solid nodules >/= 1.5 cm not described below ([place]): 0 _________________________________________________________ Nodule # 1: Location: Right; Mid Maximum size: 0.9, previously 1.0 cm; Other 2 dimensions: 0.9 x 0.5 cm Composition: solid/almost completely solid (2) Echogenicity: hypoechoic (2) Shape: not taller-than-wide (0) Margins: ill-defined (0) Echogenic foci: none (0) ACR TI-RADS total points: 4. ACR TI-RADS risk category: TR4 (4-6 points). ACR TI-RADS recommendations: Given size (<0.9 cm) and appearance, this nodule does NOT meet TI-RADS criteria for biopsy or dedicated follow-up.  _________________________________________________________ A second right mid thyroid solid isoechoic TR 3 nodule is also slightly smaller measuring 9 mm, previously 11 mm. This would not meet criteria for any biopsy or follow-up. No new thyroid abnormality. Normal vascularity. No regional adenopathy. IMPRESSION: 0.9 cm right mid thyroid TR 4 nodule is slightly smaller. This nodule does not meet criteria for any biopsy or follow-up. No new thyroid abnormality. No extrathyroidal lesion or abnormality to suggest parathyroid adenoma. The above is in keeping with the ACR TI-RADS recommendations - J Am Coll Radiol 2017;14:587-595. Electronically Signed   By: Jerilynn Mages.  Shick M.D.   On: 11/20/2018 09:24     Assessment & Plan:  Plan  I have discontinued Anderson Malta C. Bogacki's fluticasone and azelastine. I am also having her start on lisinopril-hydrochlorothiazide. Additionally, I am having her maintain her ranitidine, SUMAtriptan, cetirizine, gabapentin, traZODone, Vitamin D (Ergocalciferol), and naproxen sodium.  Meds ordered this encounter  Medications  . lisinopril-hydrochlorothiazide (ZESTORETIC) 10-12.5 MG tablet    Sig: Take 1 tablet by mouth  daily.    Dispense:  30 tablet    Refill:  2    Problem List Items Addressed This Visit      Unprioritized   Essential hypertension    Poorly controlled will alter medications, encouraged DASH diet, minimize caffeine and obtain adequate sleep. Report concerning symptoms and follow up as directed and as needed      Relevant Medications   lisinopril-hydrochlorothiazide (ZESTORETIC) 10-12.5 MG tablet   Other Relevant Orders   Basic metabolic panel    Other Visit Diagnoses    Joint swelling    -  Primary   Relevant Orders   Thyroid Panel With TSH   Need for influenza vaccination       Relevant Orders   Flu Vaccine QUAD 36+ mos IM (Completed)      Follow-up: Return in about 2 weeks (around 02/12/2019), or if symptoms worsen or fail to improve, for  hypertension.  [name], DO

## 2019-01-29 NOTE — Patient Instructions (Signed)

## 2019-01-29 NOTE — Assessment & Plan Note (Signed)
Poorly controlled will alter medications, encouraged DASH diet, minimize caffeine and obtain adequate sleep. Report concerning symptoms and follow up as directed and as needed 

## 2019-01-30 LAB — BASIC METABOLIC PANEL
BUN: 18 mg/dL (ref 7–25)
CO2: 27 mmol/L (ref 20–32)
Calcium: 9 mg/dL (ref 8.6–10.4)
Chloride: 106 mmol/L (ref 98–110)
Creat: 0.82 mg/dL (ref 0.50–1.05)
Glucose, Bld: 107 mg/dL — ABNORMAL HIGH (ref 65–99)
Potassium: 3.6 mmol/L (ref 3.5–5.3)
Sodium: 142 mmol/L (ref 135–146)

## 2019-01-30 LAB — THYROID PANEL WITH TSH
Free Thyroxine Index: 2.4 (ref 1.4–3.8)
T3 Uptake: 32 % (ref 22–35)
T4, Total: 7.6 ug/dL (ref 5.1–11.9)
TSH: 0.44 mIU/L (ref 0.40–4.50)

## 2019-01-30 LAB — EXTRA LAV TOP TUBE

## 2019-02-05 ENCOUNTER — Other Ambulatory Visit: Payer: Self-pay | Admitting: Family Medicine

## 2019-02-10 ENCOUNTER — Other Ambulatory Visit: Payer: Self-pay

## 2019-02-11 ENCOUNTER — Ambulatory Visit (INDEPENDENT_AMBULATORY_CARE_PROVIDER_SITE_OTHER): Payer: 59 | Admitting: Family Medicine

## 2019-02-11 VITALS — BP 112/80 | HR 69 | Temp 97.4°F | Resp 18 | Ht 62.0 in | Wt 150.0 lb

## 2019-02-11 DIAGNOSIS — M2540 Effusion, unspecified joint: Secondary | ICD-10-CM

## 2019-02-11 DIAGNOSIS — I10 Essential (primary) hypertension: Secondary | ICD-10-CM | POA: Diagnosis not present

## 2019-02-11 DIAGNOSIS — R600 Localized edema: Secondary | ICD-10-CM

## 2019-02-11 HISTORY — DX: Effusion, unspecified joint: M25.40

## 2019-02-11 HISTORY — DX: Localized edema: R60.0

## 2019-02-11 NOTE — Assessment & Plan Note (Signed)
con't hct Elevated legs Compression prn

## 2019-02-11 NOTE — Patient Instructions (Signed)

## 2019-02-11 NOTE — Progress Notes (Signed)
Patient ID: Elizabeth Meyer, female    DOB: [birth year]  Age: 58 y.o. MRN: [id number]    Subjective:  Subjective  HPI Elizabeth Meyer presents for f/u swelling and bp  She is doing much better    She thinks the med may be making have no energy but otherwise she is ok and she does not wish to change it now  Review of Systems  Constitutional: Negative for activity change, appetite change, fatigue and unexpected weight change.  Respiratory: Negative for cough and shortness of breath.   Cardiovascular: Negative for chest pain and palpitations.  Psychiatric/Behavioral: Negative for behavioral problems and dysphoric mood. The patient is not nervous/anxious.     History Past Medical History:  Diagnosis Date   Complex cyst of left ovary    Endometrial polyp    Heart murmur Dr Johnsie Cancel   dx 2012--  Benign--  per echo abnormal mitral inflow   History of gastrointestinal ulcer    IBS (irritable bowel syndrome)    Seasonal allergies    Wears contact lenses     She has a past surgical history that includes transthoracic echocardiogram (08-12-2007); Plantar fascia surgery (Left, 2009); Colonoscopy with propofol (last one 04-28-2014); Dilatation & currettage/hysteroscopy with resectoscope (N/A, 06/06/2014); Laparoscopic salpingo oophorectomy (Left, 06/06/2014); Cystoscopy (N/A, 06/06/2014); Carpal tunnel release (Left, 05/15/2017); and Parathyroidectomy (Right, 01/13/2018).   Her family history includes Dementia in her paternal grandmother; Diabetes in her maternal grandfather; Glaucoma in her mother; Heart disease in her paternal grandfather; Hyperlipidemia in her mother; Hypertension in her mother; Tuberculosis in her maternal grandfather and another family member.She reports that she has never smoked. She has never used smokeless tobacco. She reports current alcohol use. She reports that she does not use drugs.  Current Outpatient Medications on File Prior to Visit  Medication Sig Dispense  Refill   cetirizine (ZYRTEC) 10 MG tablet Take 1 tablet (10 mg total) by mouth at bedtime. 30 tablet 5   gabapentin (NEURONTIN) 100 MG capsule TAKE 2 CAPSULES (200 MG TOTAL) BY MOUTH AT BEDTIME. 180 capsule 2   lisinopril-hydrochlorothiazide (ZESTORETIC) 10-12.5 MG tablet Take 1 tablet by mouth daily. 30 tablet 2   naproxen sodium (ALEVE) 220 MG tablet Take 220 mg by mouth daily as needed.     ranitidine (ZANTAC) 150 MG tablet TAKE 1 TABLET BY MOUTH TWICE A DAY (Patient taking differently: Take 150 mg by mouth daily as needed for heartburn. ) 60 tablet 2   SUMAtriptan (IMITREX) 100 MG tablet Take 100 mg by mouth every 2 (two) hours as needed for migraine.   1   traZODone (DESYREL) 100 MG tablet TAKE 1 TABLET (100 MG TOTAL) BY MOUTH AT BEDTIME AS NEEDED FOR SLEEP. 90 tablet 1   Vitamin D, Ergocalciferol, (DRISDOL) 1.25 MG (50000 UT) CAPS capsule TAKE 1 CAPSULE BY MOUTH ONCE A WEEK 12 capsule 0   No current facility-administered medications on file prior to visit.      Objective:  Objective  Physical Exam Vitals signs and nursing note reviewed.  Constitutional:      Appearance: She is well-developed.  HENT:     Head: Normocephalic and atraumatic.  Eyes:     Conjunctiva/sclera: Conjunctivae normal.  Neck:     Musculoskeletal: Normal range of motion and neck supple.     Thyroid: No thyromegaly.     Vascular: No carotid bruit or JVD.  Cardiovascular:     Rate and Rhythm: Normal rate and regular rhythm.     Heart sounds:  Normal heart sounds. No murmur.  Pulmonary:     Effort: Pulmonary effort is normal. No respiratory distress.     Breath sounds: Normal breath sounds. No wheezing or rales.  Chest:     Chest wall: No tenderness.  Neurological:     Mental Status: She is alert and oriented to person, place, and time.    BP 112/80 (BP Location: Right Arm, Patient Position: Sitting, Cuff Size: Normal)    Pulse 69    Temp (!) 97.4 F (36.3 C) (Temporal)    Resp 18    Ht 5\' 2"   (1.575 m)    Wt 150 lb (68 kg)    LMP 03/30/2016    SpO2 98%    BMI 27.44 kg/m  Wt Readings from Last 3 Encounters:  02/11/19 150 lb (68 kg)  01/29/19 154 lb 6.4 oz (70 kg)  01/28/19 153 lb 6.4 oz (69.6 kg)     Lab Results  Component Value Date   WBC 6.4 01/09/2018   HGB 12.4 01/09/2018   HCT 37.3 01/09/2018   PLT 329 01/09/2018   GLUCOSE 107 (H) 01/29/2019   ALT 15 09/09/2017   AST 14 09/09/2017   NA 142 01/29/2019   K 3.6 01/29/2019   CL 106 01/29/2019   CREATININE 0.82 01/29/2019   BUN 18 01/29/2019   CO2 27 01/29/2019   TSH 0.44 01/29/2019    US Thyroid  Result Date: 11/20/2018 CLINICAL DATA:  Thyroid nodules EXAM: THYROID ULTRASOUND TECHNIQUE: Ultrasound examination of the thyroid gland and adjacent soft tissues was performed. COMPARISON:  11/13/2017 FINDINGS: Parenchymal Echotexture: Mildly heterogenous Isthmus: 4 mm Right lobe: 4.9 x 1.6 x 1.5 cm, previously 4.5 x 1.7 x 1.3 cm Left lobe: 4.4 x 1.3 x 1.2 cm, previously 4.5 x 1.7 x 1.2 cm _________________________________________________________ Estimated total number of nodules >/= 1 cm: 0 Number of spongiform nodules >/=  2 cm not described below (TR1): 0 Number of mixed cystic and solid nodules >/= 1.5 cm not described below ([place]): 0 _________________________________________________________ Nodule # 1: Location: Right; Mid Maximum size: 0.9, previously 1.0 cm; Other 2 dimensions: 0.9 x 0.5 cm Composition: solid/almost completely solid (2) Echogenicity: hypoechoic (2) Shape: not taller-than-wide (0) Margins: ill-defined (0) Echogenic foci: none (0) ACR TI-RADS total points: 4. ACR TI-RADS risk category: TR4 (4-6 points). ACR TI-RADS recommendations: Given size (<0.9 cm) and appearance, this nodule does NOT meet TI-RADS criteria for biopsy or dedicated follow-up. _________________________________________________________ A second right mid thyroid solid isoechoic TR 3 nodule is also slightly smaller measuring 9 mm, previously 11 mm.  This would not meet criteria for any biopsy or follow-up. No new thyroid abnormality. Normal vascularity. No regional adenopathy. IMPRESSION: 0.9 cm right mid thyroid TR 4 nodule is slightly smaller. This nodule does not meet criteria for any biopsy or follow-up. No new thyroid abnormality. No extrathyroidal lesion or abnormality to suggest parathyroid adenoma. The above is in keeping with the ACR TI-RADS recommendations - J Am Coll Radiol 2017;14:587-595. Electronically Signed   By: Jerilynn Mages.  Shick M.D.   On: 11/20/2018 09:24     Assessment & Plan:  Plan  I am having Elizabeth Meyer maintain her ranitidine, SUMAtriptan, cetirizine, gabapentin, Vitamin D (Ergocalciferol), naproxen sodium, lisinopril-hydrochlorothiazide, and traZODone.  No orders of the defined types were placed in this encounter.   Problem List Items Addressed This Visit      Unprioritized   Essential hypertension - Primary    Well controlled, no changes to meds. Encouraged heart healthy  diet such as the DASH diet and exercise as tolerated.        Relevant Orders   Basic metabolic panel   Joint swelling    Improved with diuretic        Lower extremity edema    con't hct Elevated legs Compression prn          Follow-up: Return in about 3 months (around 05/14/2019), or if symptoms worsen or fail to improve, for annual exam, fasting.  [name], DO

## 2019-02-11 NOTE — Assessment & Plan Note (Signed)
Well controlled, no changes to meds. Encouraged heart healthy diet such as the DASH diet and exercise as tolerated.  °

## 2019-02-11 NOTE — Assessment & Plan Note (Signed)
Improved with diuretic

## 2019-02-12 LAB — BASIC METABOLIC PANEL
BUN: 18 mg/dL (ref 6–23)
CO2: 29 mEq/L (ref 19–32)
Calcium: 8.9 mg/dL (ref 8.4–10.5)
Chloride: 103 mEq/L (ref 96–112)
Creatinine, Ser: 0.75 mg/dL (ref 0.40–1.20)
GFR: 79.92 mL/min (ref >60.00)
Glucose, Bld: 127 mg/dL — ABNORMAL HIGH (ref 70–99)
Potassium: 3.6 mEq/L (ref 3.5–5.1)
Sodium: 141 mEq/L (ref 135–145)

## 2019-02-16 ENCOUNTER — Other Ambulatory Visit: Payer: Self-pay | Admitting: Family Medicine

## 2019-02-16 DIAGNOSIS — R739 Hyperglycemia, unspecified: Secondary | ICD-10-CM

## 2019-02-28 ENCOUNTER — Encounter: Payer: Self-pay | Admitting: Emergency Medicine

## 2019-02-28 ENCOUNTER — Other Ambulatory Visit: Payer: Self-pay

## 2019-02-28 ENCOUNTER — Ambulatory Visit
Admission: EM | Admit: 2019-02-28 | Discharge: 2019-02-28 | Disposition: A | Discharge status: Home / Self Care | Payer: 59 | Attending: Emergency Medicine | Admitting: Emergency Medicine

## 2019-02-28 DIAGNOSIS — Z20828 Contact with and (suspected) exposure to other viral communicable diseases: Secondary | ICD-10-CM

## 2019-02-28 DIAGNOSIS — R0981 Nasal congestion: Secondary | ICD-10-CM

## 2019-02-28 DIAGNOSIS — Z20822 Contact with and (suspected) exposure to covid-19: Secondary | ICD-10-CM

## 2019-02-28 NOTE — ED Triage Notes (Signed)
Pt presents after being exposed to her Daughter on Tuesday and daughter tested positive Friday for COVID after developing symptoms Wednesday.  C/o stuffy nose Friday morning, but denies symptoms otherwise.

## 2019-02-28 NOTE — ED Notes (Signed)
Patient able to ambulate independently  

## 2019-02-28 NOTE — ED Provider Notes (Signed)
[facility]    CSN: [id number] Arrival date & time: 02/28/19  0850      History   Chief Complaint Chief Complaint  Patient presents with  . COVID Exposure    HPI Elizabeth Meyer is a 58 y.o. female   Presenting for Covid testing: Exposure: Daughter who tested positive on Friday Date of exposure: Tuesday Any fever, symptoms since exposure: Nasal congestion, attributed to seasonal allergies.  Has been taking allergy tablet daily with moderate relief.   Past Medical History:  Diagnosis Date  . Complex cyst of left ovary   . Endometrial polyp   . Heart murmur Dr Johnsie Cancel   dx 2012--  Benign--  per echo abnormal mitral inflow  . History of gastrointestinal ulcer   . IBS (irritable bowel syndrome)   . Seasonal allergies   . Wears contact lenses     Patient Active Problem List   Diagnosis Date Noted  . Lower extremity edema 02/11/2019  . Joint swelling 02/11/2019  . Essential hypertension 01/29/2019  . Nonallopathic lesion of sacral region 06/10/2018  . Nonallopathic lesion of lumbosacral region 06/10/2018  . Nonallopathic lesion of thoracic region 06/10/2018  . Nonallopathic lesion of cervical region 06/10/2018  . Nonallopathic lesion of rib cage 06/10/2018  . Hyperparathyroidism, primary ([place]) 01/11/2018  . Low back pain 09/09/2017  . Elevated LDL cholesterol level 06/17/2017  . Acquired hypothyroidism 06/17/2017  . Post-menopausal 06/17/2017  . Cough 05/30/2017  . Suprapubic pain 05/30/2017  . Patellofemoral syndrome of both knees 12/19/2016  . Allergic dermatitis 09/16/2015  . Plantar fasciitis, right 07/27/2014  . Loss of transverse plantar arch 07/27/2014  . Pharyngitis 06/21/2014  . 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    Class: Present on Admission  . Endometrial polyp 06/06/2014    Class: Present on Admission  . Sinusitis, acute frontal 06/03/2014  . IBS (irritable bowel syndrome) 03/08/2014    Past Surgical History:  Procedure  Laterality Date  . CARPAL TUNNEL RELEASE Left 05/15/2017   Procedure: LEFT CARPAL TUNNEL RELEASE;  Surgeon: [name], MD;  Location: [place];  Service: Orthopedics;  Laterality: Left;  . COLONOSCOPY WITH PROPOFOL  last one 04-28-2014  . CYSTOSCOPY N/A 06/06/2014   Procedure: CYSTOSCOPY;  Surgeon: [name], MD;  [facility];  Service: Gynecology;  Laterality: N/A;  . DILATATION & CURRETTAGE/HYSTEROSCOPY WITH RESECTOCOPE N/A 06/06/2014   Procedure: DILATATION & CURETTAGE/HYSTEROSCOPY WITH RESECTOCOPE;  Surgeon: [name], MD;  Location: [place];  Service: Gynecology;  Laterality: N/A;  . LAPAROSCOPIC SALPINGO OOPHERECTOMY Left 06/06/2014   Procedure: LAPAROSCOPIC LEFT SALPINGO OOPHORECTOMY;  Surgeon: [name], MD;  Location: [place];  Service: Gynecology;  Laterality: Left;  . PARATHYROIDECTOMY Right 01/13/2018   Procedure: RIGHT SUPERIOR PARATHYROIDECTOMY;  Surgeon: Armandina Gemma, MD;  Location: [facility];  Service: General;  Laterality: Right;  . PLANTAR FASCIA SURGERY Left 2009   topaz procedure  . TRANSTHORACIC ECHOCARDIOGRAM  08-12-2007   normal LV,  ef 65-70%,  abnormal mitral inflow with trivial MR,  trivial PR and TR    OB History   No obstetric history on file.      Home Medications    Prior to Admission medications   Medication Sig Start Date End Date Taking? Authorizing Provider  cetirizine (ZYRTEC) 10 MG tablet Take 1 tablet (10 mg total) by mouth at bedtime. 01/27/18   Roma Schanz R, DO  gabapentin (NEURONTIN) 100 MG capsule TAKE  2 CAPSULES (200 MG TOTAL) BY MOUTH AT BEDTIME. 07/02/18   Lyndal Pulley, DO  lisinopril-hydrochlorothiazide (ZESTORETIC) 10-12.5 MG tablet Take 1 tablet by mouth daily. 01/29/19   Roma Schanz R, DO  naproxen sodium (ALEVE) 220 MG tablet Take 220 mg by mouth daily as needed.    [provider]  ranitidine (ZANTAC) 150 MG tablet TAKE 1  TABLET BY MOUTH TWICE A DAY Patient taking differently: Take 150 mg by mouth daily as needed for heartburn.  08/26/17   [name], DO  SUMAtriptan (IMITREX) 100 MG tablet Take 100 mg by mouth every 2 (two) hours as needed for migraine.  08/12/17   [provider]  traZODone (DESYREL) 100 MG tablet TAKE 1 TABLET (100 MG TOTAL) BY MOUTH AT BEDTIME AS NEEDED FOR SLEEP. 02/08/19   Lyndal Pulley, DO  Vitamin D, Ergocalciferol, (DRISDOL) 1.25 MG (50000 UT) CAPS capsule TAKE 1 CAPSULE BY MOUTH ONCE A WEEK 11/25/18   Lyndal Pulley, DO    Family History Family History  Problem Relation Age of Onset  . Glaucoma Mother   . Hypertension Mother   . Hyperlipidemia Mother   . Heart disease Paternal Grandfather   . Tuberculosis Maternal Grandfather   . Diabetes Maternal Grandfather   . Dementia Paternal Grandmother   . Tuberculosis Other   . Colon cancer Neg Hx   . Stomach cancer Neg Hx     Social History Social History   Tobacco Use  . Smoking status: Never Smoker  . Smokeless tobacco: Never Used  Substance Use Topics  . Alcohol use: Yes    Comment: occasional  . Drug use: Never     Allergies   Patient has no known allergies.   Review of Systems Review of Systems  Constitutional: Negative for fatigue and fever.  HENT: Positive for congestion. Negative for dental problem, ear pain, facial swelling, hearing loss, postnasal drip, rhinorrhea, sinus pressure, sinus pain, sneezing, sore throat, trouble swallowing and voice change.   Eyes: Negative for photophobia, pain and visual disturbance.  Respiratory: Negative for cough and shortness of breath.   Cardiovascular: Negative for chest pain and palpitations.  Gastrointestinal: Negative for diarrhea and vomiting.  Musculoskeletal: Negative for arthralgias and myalgias.  Neurological: Negative for dizziness and headaches.     Physical Exam Triage Vital Signs ED Triage Vitals  Enc Vitals Group     BP 02/28/19  0902 132/90     Pulse Rate 02/28/19 0902 86     Resp 02/28/19 0902 18     Temp 02/28/19 0902 97.7 F (36.5 C)     Temp Source 02/28/19 0902 Temporal     SpO2 02/28/19 0902 95 %     Weight --      Height --      Head Circumference --      Peak Flow --      Pain Score 02/28/19 0903 0     Pain Loc --      Pain Edu? --      Excl. in [place]? --    No data found.  Updated Vital Signs BP 132/90 (BP Location: Left Arm)   Pulse 86   Temp 97.7 F (36.5 C) (Temporal)   Resp 18   LMP 03/30/2016   SpO2 95%   Visual Acuity Right Eye Distance:   Left Eye Distance:   Bilateral Distance:    Right Eye Near:   Left Eye Near:    Bilateral Near:  Physical Exam Constitutional:      General: She is not in acute distress. HENT:     Head: Normocephalic and atraumatic.  Eyes:     General: No scleral icterus.    Pupils: Pupils are equal, round, and reactive to light.  Cardiovascular:     Rate and Rhythm: Normal rate.  Pulmonary:     Effort: Pulmonary effort is normal.  Skin:    Coloration: Skin is not jaundiced or pale.  Neurological:     Mental Status: She is alert and oriented to person, place, and time.      UC Treatments / Results  Labs (all labs ordered are listed, but only abnormal results are displayed) Labs Reviewed  NOVEL CORONAVIRUS, NAA    EKG   Radiology No results found.  Procedures Procedures (including critical care time)  Medications Ordered in UC Medications - No data to display  Initial Impression / Assessment and Plan / UC Course  I have reviewed the triage vital signs and the nursing notes.  Pertinent labs & imaging results that were available during my care of the patient were reviewed by me and considered in my medical decision making (see chart for details).     PCR testing obtained: Patient to quarantine until the interim.  Reviewed symptom management, remote medical visit options as outlined below.  Return precautions discussed, patient  verbalized understanding and is agreeable to plan. Final Clinical Impressions(s) / UC Diagnoses   Final diagnoses:  Exposure to COVID-19 virus  Nasal congestion     Discharge Instructions     Your COVID test is pending - it is important to quarantine / isolate at home until your results are back. If you test positive and would like further evaluation for persistent or worsening symptoms, you may schedule an E-visit or virtual (video) visit throughout the [facility] app or website.  PLEASE NOTE: If you develop severe chest pain or shortness of breath please go to the ER or call 9-1-1 for further evaluation --> DO NOT schedule electronic or virtual visits for this. Please call our office for further guidance / recommendations as needed.    ED Prescriptions    None     PDMP not reviewed this encounter.   Neldon Mc Grayson, [place] 02/28/19 [phone]

## 2019-02-28 NOTE — Discharge Instructions (Addendum)
Your COVID test is pending - it is important to quarantine / isolate at home until your results are back. °If you test positive and would like further evaluation for persistent or worsening symptoms, you may schedule an E-visit or virtual (video) visit throughout the [facility] MyChart app or website. ° °PLEASE NOTE: If you develop severe chest pain or shortness of breath please go to the ER or call 9-1-1 for further evaluation --> DO NOT schedule electronic or virtual visits for this. °Please call our office for further guidance / recommendations as needed. °

## 2019-03-01 LAB — NOVEL CORONAVIRUS, NAA: SARS-CoV-2, NAA: NOT DETECTED

## 2019-03-19 ENCOUNTER — Other Ambulatory Visit: Payer: Self-pay

## 2019-03-19 ENCOUNTER — Ambulatory Visit (INDEPENDENT_AMBULATORY_CARE_PROVIDER_SITE_OTHER): Payer: 59 | Admitting: Family

## 2019-03-19 DIAGNOSIS — Z20822 Contact with and (suspected) exposure to covid-19: Secondary | ICD-10-CM

## 2019-03-19 DIAGNOSIS — J029 Acute pharyngitis, unspecified: Secondary | ICD-10-CM

## 2019-03-19 MED ORDER — AMOXICILLIN 500 MG PO CAPS: 500.0000 mg | ORAL_CAPSULE | Freq: Three times a day (TID) | ORAL | 0 refills | Status: DC

## 2019-03-19 NOTE — Progress Notes (Signed)
Virtual Visit via Video Note  I connected with Elizabeth Meyer on 03/19/19 at 10:00 AM EST by a video enabled telemedicine application and verified that I am speaking with the correct person using two identifiers.  Location: Patient: home Provider: home   I discussed the limitations of evaluation and management by telemedicine and the availability of in person appointments. The patient expressed understanding and agreed to proceed.  History of Present Illness:  Patient is a 58 yr old female who presents today to discuss sore throat.  Reports that she woke up middle of the night on Thursday with sore throat.  Notes some congestion/stuffieness when she woke up.  Yesterday afternoon she noted some white "things in the pockets" of her tonsils. + nasal congestion last night.    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  Neither she nor the children or other teachers wear  Masks.  One of the students has been out sick.  None of the other teachers are sick.  No significant cough.  She reports some sinus headache.  She denies associated fever.  Notes some back tightness but no myalgia.  She denies loss of taste/smell.   Past Medical History:  Diagnosis Date  . Complex cyst of left ovary   . Endometrial polyp   . Heart murmur Dr Johnsie Cancel   dx 2012--  Benign--  per echo abnormal mitral inflow  . History of gastrointestinal ulcer   . IBS (irritable bowel syndrome)   . Seasonal allergies   . Wears contact lenses      Social History   Socioeconomic History  . Marital status: Married    Spouse name: Not on file  . Number of children: 2  . Years of education: Not on file  . Highest education level: Not on file  Occupational History  . 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 Smoking status: Never Smoker  . Smokeless tobacco: Never Used  Substance and Sexual Activity  . Alcohol use: Yes    Comment: occasional  . Drug use: Never  . Sexual activity: Not on file   Other Topics Concern  . Not on file  Social History Narrative   Married, Aeronautical engineer 2 daughters   1-2 caffeinated beverages/day   Social Determinants of Health   Financial Resource Strain:   . Difficulty of Paying Living Expenses: Not on file  Food Insecurity:   . Worried About Charity fundraiser in the Last Year: Not on file  . Ran Out of Food in the Last Year: Not on file  Transportation Needs:   . Lack of Transportation (Medical): Not on file  . Lack of Transportation (Non-Medical): Not on file  Physical Activity:   . Days of Exercise per Week: Not on file  . Minutes of Exercise per Session: Not on file  Stress:   . Feeling of Stress : Not on file  Social Connections:   . Frequency of Communication with Friends and Family: Not on file  . Frequency of Social Gatherings with Friends and Family: Not on file  . Attends Religious Services: Not on file  . Active Member of Clubs or Organizations: Not on file  . Attends Archivist Meetings: Not on file  . Marital Status: Not on file  Intimate Partner Violence:   . Fear of Current or Ex-Partner: Not on file  . Emotionally Abused: Not on file  . Physically Abused: Not on file  . Sexually  Abused: Not on file    Past Surgical History:  Procedure Laterality Date  . CARPAL TUNNEL RELEASE Left 05/15/2017   Procedure: LEFT CARPAL TUNNEL RELEASE;  Surgeon: [name], MD;  Location: [place];  Service: Orthopedics;  Laterality: Left;  . COLONOSCOPY WITH PROPOFOL  last one 04-28-2014  . CYSTOSCOPY N/A 06/06/2014   Procedure: CYSTOSCOPY;  Surgeon: [name], MD;  Location: [facility];  Service: Gynecology;  Laterality: N/A;  . DILATATION & CURRETTAGE/HYSTEROSCOPY WITH RESECTOCOPE N/A 06/06/2014   Procedure: DILATATION & CURETTAGE/HYSTEROSCOPY WITH RESECTOCOPE;  Surgeon: [name], MD;  Location: [place];  Service: Gynecology;  Laterality: N/A;  . LAPAROSCOPIC  SALPINGO OOPHERECTOMY Left 06/06/2014   Procedure: LAPAROSCOPIC LEFT SALPINGO OOPHORECTOMY;  Surgeon: [name], MD;  Location: [place];  Service: Gynecology;  Laterality: Left;  . PARATHYROIDECTOMY Right 01/13/2018   Procedure: RIGHT SUPERIOR PARATHYROIDECTOMY;  Surgeon: Armandina Gemma, MD;  Location: [facility];  Service: General;  Laterality: Right;  . PLANTAR FASCIA SURGERY Left 2009   topaz procedure  . TRANSTHORACIC ECHOCARDIOGRAM  08-12-2007   normal LV,  ef 65-70%,  abnormal mitral inflow with trivial MR,  trivial PR and TR    Family History  Problem Relation Age of Onset  . Glaucoma Mother   . Hypertension Mother   . Hyperlipidemia Mother   . Heart disease Paternal Grandfather   . Tuberculosis Maternal Grandfather   . Diabetes Maternal Grandfather   . Dementia Paternal Grandmother   . Tuberculosis Other   . Colon cancer Neg Hx   . Stomach cancer Neg Hx     No Known Allergies  Current Outpatient Medications on File Prior to Visit  Medication Sig Dispense Refill  . cetirizine (ZYRTEC) 10 MG tablet Take 1 tablet (10 mg total) by mouth at bedtime. 30 tablet 5  . gabapentin (NEURONTIN) 100 MG capsule TAKE 2 CAPSULES (200 MG TOTAL) BY MOUTH AT BEDTIME. 180 capsule 2  . lisinopril-hydrochlorothiazide (ZESTORETIC) 10-12.5 MG tablet Take 1 tablet by mouth daily. 30 tablet 2  . naproxen sodium (ALEVE) 220 MG tablet Take 220 mg by mouth daily as needed.    . ranitidine (ZANTAC) 150 MG tablet TAKE 1 TABLET BY MOUTH TWICE A DAY (Patient taking differently: Take 150 mg by mouth daily as needed for heartburn. ) 60 tablet 2  . SUMAtriptan (IMITREX) 100 MG tablet Take 100 mg by mouth every 2 (two) hours as needed for migraine.   1  . traZODone (DESYREL) 100 MG tablet TAKE 1 TABLET (100 MG TOTAL) BY MOUTH AT BEDTIME AS NEEDED FOR SLEEP. 90 tablet 1  . Vitamin D, Ergocalciferol, (DRISDOL) 1.25 MG (50000 UT) CAPS capsule TAKE 1 CAPSULE BY MOUTH ONCE A WEEK 12 capsule 0   No  current facility-administered medications on file prior to visit.    LMP 03/30/2016      Observations/Objective:   Gen: Awake, alert, no acute distress Resp: Breathing is even and non-labored Psych: calm/pleasant demeanor Neuro: Alert and Oriented x 3, + facial symmetry, speech is clear.   Assessment and Plan:  Sore Throat- recommended empiric rx with amoxicillin.  Will also send for covid testing at our community testing site. She is advised to quarantine pending review of covid-19 test results and further recommendations.  Pt verbalizes understanding.    Follow Up Instructions:    I discussed the assessment and treatment plan with the patient. The patient was provided an opportunity to ask questions  and all were answered. The patient agreed with the plan and demonstrated an understanding of the instructions.   The patient was advised to call back or seek an in-person evaluation if the symptoms worsen or if the condition fails to improve as anticipated.  Nance Pear, NP

## 2019-03-22 LAB — NOVEL CORONAVIRUS, NAA: SARS-CoV-2, NAA: NOT DETECTED

## 2019-03-24 ENCOUNTER — Encounter: Payer: Self-pay | Admitting: Family Medicine

## 2019-03-24 NOTE — Telephone Encounter (Signed)
We can do virtual tomorrow

## 2019-03-30 ENCOUNTER — Ambulatory Visit (INDEPENDENT_AMBULATORY_CARE_PROVIDER_SITE_OTHER): Payer: 59 | Admitting: Family Medicine

## 2019-03-30 ENCOUNTER — Other Ambulatory Visit: Payer: Self-pay

## 2019-03-30 DIAGNOSIS — J029 Acute pharyngitis, unspecified: Secondary | ICD-10-CM | POA: Diagnosis not present

## 2019-03-30 NOTE — Progress Notes (Signed)
Virtual Visit via Video Note  I connected with Elizabeth Meyer on 04/12/19 at  2:40 PM EST by a video enabled telemedicine application and verified that I am speaking with the correct person using two identifiers.  Location: Patient: home alone  Provider: office    I discussed the limitations of evaluation and management by telemedicine and the availability of in person appointments. The patient expressed understanding and agreed to proceed.  History of Present Illness: Pt is home c/o sore throat  -- she saw Lenna Sciara and was given an abx but she is no better  Observations/Objective: There were no vitals filed for this visit. Pt is in NAD  Assessment and Plan: 1. Pharyngitis, unspecified etiology Finish the amoxil con't zyrtec Call if symptoms persist Pt needs covid test    .Follow Up Instructions:    I discussed the assessment and treatment plan with the patient. The patient was provided an opportunity to ask questions and all were answered. The patient agreed with the plan and demonstrated an understanding of the instructions.   The patient was advised to call back or seek an in-person evaluation if the symptoms worsen or if the condition fails to improve as anticipated.  I provided 15 minutes of non-face-to-face time during this encounter.   [name], DO

## 2019-03-31 ENCOUNTER — Other Ambulatory Visit: Payer: Self-pay | Admitting: Family Medicine

## 2019-04-12 ENCOUNTER — Encounter: Payer: Self-pay | Admitting: Family Medicine

## 2019-04-12 NOTE — Assessment & Plan Note (Signed)
Finish the amoxil con't zyrtec Call if symptoms persist Pt needs covid test

## 2019-04-22 ENCOUNTER — Other Ambulatory Visit: Payer: Self-pay | Admitting: Family Medicine

## 2019-04-22 DIAGNOSIS — I10 Essential (primary) hypertension: Secondary | ICD-10-CM

## 2019-05-06 DIAGNOSIS — M170 Bilateral primary osteoarthritis of knee: Secondary | ICD-10-CM | POA: Insufficient documentation

## 2019-05-06 HISTORY — DX: Bilateral primary osteoarthritis of knee: M17.0

## 2019-05-06 NOTE — Assessment & Plan Note (Signed)
Patient failed all conservative therapy.  Has been approved for the viscosupplementation previously for these arthritis.  Patient had the injection on that date.  Tolerated it well.  Patient will follow-up again 4 weeks if not improved or if pain starts to come back.

## 2019-05-27 ENCOUNTER — Ambulatory Visit (INDEPENDENT_AMBULATORY_CARE_PROVIDER_SITE_OTHER): Payer: 59 | Admitting: Internal Medicine

## 2019-05-27 ENCOUNTER — Encounter: Payer: Self-pay | Admitting: Internal Medicine

## 2019-05-27 ENCOUNTER — Other Ambulatory Visit: Payer: Self-pay

## 2019-05-27 DIAGNOSIS — Z8639 Personal history of other endocrine, nutritional and metabolic disease: Secondary | ICD-10-CM | POA: Diagnosis not present

## 2019-05-27 DIAGNOSIS — M8580 Other specified disorders of bone density and structure, unspecified site: Secondary | ICD-10-CM

## 2019-05-27 DIAGNOSIS — E041 Nontoxic single thyroid nodule: Secondary | ICD-10-CM | POA: Diagnosis not present

## 2019-05-27 DIAGNOSIS — E210 Primary hyperparathyroidism: Secondary | ICD-10-CM

## 2019-05-27 NOTE — Progress Notes (Signed)
Patient ID: Elizabeth Meyer, female   DOB: [birth year], 59 y.o.   MRN: [id number]   Patient location: Home My location: home Persons participating in the virtual visit: patient, provider  Referring Provider: [name], DO  I connected with the patient on 05/27/19 at 10:03 AM EST by a video enabled telemedicine application and verified that I am speaking with the correct person.   I discussed the limitations of evaluation and management by telemedicine and the availability of in person appointments. The patient expressed understanding and agreed to proceed.   Details of the encounter are shown below.  HPI  Elizabeth Meyer is a 59 y.o.-year-old female, initially referred by Dr Tamala Julian, returning for follow-up for history of primary hyperparathyroidism, vitamin D deficiency, and osteopenia.  Last visit 1 year ago.  Reviewed and addended history: Patient was found to have an elevated calcium during investigation by Dr. Tamala Julian, whom she sees for joint pain (hips, knees).  A PTH level was also found to be high.  She was referred to endocrinology for further investigation.  Review of pertinent labs: Lab Results  Component Value Date   PTH 32 05/25/2018   PTH Comment 05/25/2018   PTH 98 (H) 10/07/2017   PTH 121 (H) 09/09/2017   CALCIUM 8.9 02/11/2019   CALCIUM 9.0 01/29/2019   CALCIUM 9.1 05/25/2018   CALCIUM 10.0 01/09/2018   CALCIUM 10.0 10/07/2017   CALCIUM 10.6 (H) 09/09/2017   CALCIUM 10.2 09/09/2017  Postop, calcium was normal, at 9.4, and PTH greatly decreased, at 30.  No history of osteoporosis but has osteopenia:  Lumbar spine L1-L4 (L2) Femoral neck (FN) 33% distal radius frax  07/31/2017 ([facility]) -2.4 (-10.7%*) RFN: -1.3 LFN: -0.8 n/a 10-year fracture risk 5.9% 10-year hip fracture risk: 0.3%  02/24/2014 -1.5  n/a   *Statistically significant difference  The DXA report is not clear in comparison between the hip bone mineral densities between 2019  and 2015.  It looks that the bone density have decreased but I cannot tell whether it is the total hip BMD's that were compared.    She had a high calcitriol level but normal magnesium and phosphorus: Component     Latest Ref Rng & Units 10/07/2017          Vitamin D 1, 25 (OH) Total     18 - 72 pg/mL 84 (H)  Vitamin D3 1, 25 (OH)     pg/mL 41  Vitamin D2 1, 25 (OH)     pg/mL 43  Magnesium     1.5 - 2.5 mg/dL 2.2  Phosphorus     2.3 - 4.6 mg/dL 2.9   24-hour urine calcium was normal, but at the upper limit of normal: Component     Latest Ref Rng & Units 10/15/2017  Creatinine, 24H Ur     0.50 - 2.15 g/24 h 0.78  Calcium, 24H Urine     mg/24 h 198   No history of kidney stones.  Thyroid ultrasound (11/13/2017): 1 x 0.9 x 0.6 cm solid/almost completely solid, hypoechoic nodule in the right mid gland, but now sign of parathyroid adenoma.  Indication was for repeat ultrasound in a year.  Technetium sestamibi parathyroid scan (11/13/2017): Was negative for parathyroid adenoma  4D CT of the neck (12/04/2017): No abnormal left-sided finding. On the right, there appears to be relative enlargement of the superior parathyroid gland (marked with double arrows), but this retains a somewhat flattened appearance. This could represent an early  adenoma or hyperplasia. Below that on the right, there are 2 small nodular foci at the tracheoesophageal groove which do not show enhancement on arterial phase and therefore more consistent with small nonpathologic lymph nodes.  She saw Dr. Harlow Asa and had right superior parathyroidectomy on 01/13/2018.  Pathology showed an adenoma of 0.25 g (1.5 x 0.9 x 0.2 cm).  Postoperatively, calcium was normal, at 9.4, and the PTH greatly decreased, and 30.  No history of CKD. Last BUN/Cr: Lab Results  Component Value Date   BUN 18 02/11/2019   BUN 18 01/29/2019   CREATININE 0.75 02/11/2019   CREATININE 0.82 01/29/2019   She has a history of vitamin D  deficiency.  Last vitamin D level was normal: Lab Results  Component Value Date   VD25OH 33.13 05/25/2018   VD25OH 34.95 09/09/2017   At last visit she was forgetting ergocalciferol 50,000 units and probably took it monthly, rather than weekly. Now taking it once a week.  Latest TSH was normal: Lab Results  Component Value Date   TSH 0.44 01/29/2019   Pt does not have a FH She denies pituitary tumors, thyroid cancer, or osteoporosis.  Her daughter and her mother  have had kidney stones.   Pt. also has a history of carpal tunnel surgeries bilaterally.   She has back pan  - herniated disk - on Neurontin.  She was started on BP medicines recently - Zestoretic.  ROS: Constitutional: no weight gain/no weight loss, no fatigue, no subjective hyperthermia, no subjective hypothermia Eyes: no blurry vision, no xerophthalmia ENT: no sore throat, no nodules palpated in neck, no dysphagia, no odynophagia, no hoarseness Cardiovascular: no CP/no SOB/no palpitations/no leg swelling Respiratory: no cough/no SOB/no wheezing Gastrointestinal: no N/no V/no D/no C/no acid reflux Musculoskeletal: + muscle aches+ joint aches Skin: no rashes, no hair loss Neurological: no tremors/no numbness/no tingling/no dizziness, + sciatica pain  I reviewed pt's medications, allergies, PMH, social hx, family hx, and changes were documented in the history of present illness. Otherwise, unchanged from my initial visit note.  Past Medical History:  Diagnosis Date  . Complex cyst of left ovary   . Endometrial polyp   . Heart murmur Dr Johnsie Cancel   dx 2012--  Benign--  per echo abnormal mitral inflow  . History of gastrointestinal ulcer   . IBS (irritable bowel syndrome)   . Seasonal allergies   . Wears contact lenses    Past Surgical History:  Procedure Laterality Date  . CARPAL TUNNEL RELEASE Left 05/15/2017   Procedure: LEFT CARPAL TUNNEL RELEASE;  Surgeon: [name], MD;  Location: [place];   Service: Orthopedics;  Laterality: Left;  . COLONOSCOPY WITH PROPOFOL  last one 04-28-2014  . CYSTOSCOPY N/A 06/06/2014   Procedure: CYSTOSCOPY;  Surgeon: [name], MD;  Location: [facility];  Service: Gynecology;  Laterality: N/A;  . DILATATION & CURRETTAGE/HYSTEROSCOPY WITH RESECTOCOPE N/A 06/06/2014   Procedure: DILATATION & CURETTAGE/HYSTEROSCOPY WITH RESECTOCOPE;  Surgeon: [name], MD;  Location: [place];  Service: Gynecology;  Laterality: N/A;  . LAPAROSCOPIC SALPINGO OOPHERECTOMY Left 06/06/2014   Procedure: LAPAROSCOPIC LEFT SALPINGO OOPHORECTOMY;  Surgeon: [name], MD;  Location: [place];  Service: Gynecology;  Laterality: Left;  . PARATHYROIDECTOMY Right 01/13/2018   Procedure: RIGHT SUPERIOR PARATHYROIDECTOMY;  Surgeon: Armandina Gemma, MD;  Location: [facility];  Service: General;  Laterality: Right;  . PLANTAR FASCIA SURGERY Left 2009   topaz procedure  . TRANSTHORACIC ECHOCARDIOGRAM  08-12-2007   normal LV,  ef 65-70%,  abnormal mitral inflow with trivial MR,  trivial PR and TR   Social History   Socioeconomic History  . Marital status: Married    Spouse name: Not on file  . Number of children: 2  . Years of education: Not on file  . Highest education level: Not on file  Occupational History  . 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 Smoking status: Never Smoker  . Smokeless tobacco: Never Used  Substance and Sexual Activity  . Alcohol use: Yes    Comment: occasional  . Drug use: Never  . Sexual activity: Not on file  Other Topics Concern  . Not on file  Social History Narrative   Married, Aeronautical engineer 2 daughters   1-2 caffeinated beverages/day   Social Determinants of Health   Financial Resource Strain:   . Difficulty of Paying Living Expenses: Not on file  Food Insecurity:   . Worried About Charity fundraiser in the Last Year: Not on file  . Ran Out of Food in  the Last Year: Not on file  Transportation Needs:   . Lack of Transportation (Medical): Not on file  . Lack of Transportation (Non-Medical): Not on file  Physical Activity:   . Days of Exercise per Week: Not on file  . Minutes of Exercise per Session: Not on file  Stress:   . Feeling of Stress : Not on file  Social Connections:   . Frequency of Communication with Friends and Family: Not on file  . Frequency of Social Gatherings with Friends and Family: Not on file  . Attends Religious Services: Not on file  . Active Member of Clubs or Organizations: Not on file  . Attends Archivist Meetings: Not on file  . Marital Status: Not on file  Intimate Partner Violence:   . Fear of Current or Ex-Partner: Not on file  . Emotionally Abused: Not on file  . Physically Abused: Not on file  . Sexually Abused: Not on file   Current Outpatient Medications on File Prior to Visit  Medication Sig Dispense Refill  . gabapentin (NEURONTIN) 100 MG capsule TAKE 2 CAPSULES (200 MG TOTAL) BY MOUTH AT BEDTIME. 180 capsule 2  . lisinopril-hydrochlorothiazide (ZESTORETIC) 10-12.5 MG tablet TAKE 1 TABLET BY MOUTH EVERY DAY 90 tablet 1  . naproxen sodium (ALEVE) 220 MG tablet Take 220 mg by mouth daily as needed.    . ranitidine (ZANTAC) 150 MG tablet TAKE 1 TABLET BY MOUTH TWICE A DAY (Patient taking differently: Take 150 mg by mouth daily as needed for heartburn. ) 60 tablet 2  . SUMAtriptan (IMITREX) 100 MG tablet Take 100 mg by mouth every 2 (two) hours as needed for migraine.   1  . traZODone (DESYREL) 100 MG tablet TAKE 1 TABLET (100 MG TOTAL) BY MOUTH AT BEDTIME AS NEEDED FOR SLEEP. 90 tablet 1  . Vitamin D, Ergocalciferol, (DRISDOL) 1.25 MG (50000 UT) CAPS capsule TAKE 1 CAPSULE BY MOUTH ONCE A WEEK 12 capsule 0  . cetirizine (ZYRTEC) 10 MG tablet Take 1 tablet (10 mg total) by mouth at bedtime. (Patient not taking: Reported on 05/27/2019) 30 tablet 5   No current facility-administered  medications on file prior to visit.   No Known Allergies Family History  Problem Relation Age of Onset  . Glaucoma Mother   . Hypertension Mother   . Hyperlipidemia Mother   . Heart disease Paternal  Grandfather   . Tuberculosis Maternal Grandfather   . Diabetes Maternal Grandfather   . Dementia Paternal Grandmother   . Tuberculosis Other   . Colon cancer Neg Hx   . Stomach cancer Neg Hx     PE: LMP 03/30/2016  Wt Readings from Last 3 Encounters:  02/11/19 150 lb (68 kg)  01/29/19 154 lb 6.4 oz (70 kg)  01/28/19 153 lb 6.4 oz (69.6 kg)   Constitutional:  in NAD  The physical exam was not performed (virtual visit).  Assessment: 1. Hypercalcemia/hyperparathyroidism  2.  History of vitamin D deficiency  3.  Osteopenia  4.  Thyroid nodule  Plan: Patient with history of slightly elevated calcium and also elevated PTH level.  High ACTH was 121.  She also had a history of vitamin D deficiency and at last visit was on replacement with ergocalciferol weekly.  Latest level was reviewed and was normal. -She has no apparent complications from hypercalcemia: No history of nephrolithiasis, no osteoporosis (however, she does have osteopenia), no fractures.  She denied abdominal pain, depression, bone pain.  She did have joint pains for which she was seen Dr. Tamala Julian. -She had extensive investigation with neck ultrasound, parathyroid scan and 4D CT and the last test finally revealed a parathyroid adenoma -She had parathyroidectomy on 01/13/2018 and this was successful in extracting the 0.25 g right superior adenomatous parathyroid -Calcium and PTH obtained after surgery were normal.  Also, a recent calcium obtained in 02/2019 was normal.  We will recheck this when she returns to the clinic along with a vitamin D level  2.  History of vitamin D deficiency -At last visit she was on ergocalciferol monthly instead of the recommended weekly, as she forgot doses, however, she is now doing better  with taking the medication every week -Recheck vitamin D at next lab draw  3.  Osteopenia -She is due for a DXA scan in 07/2019 (needs to be checked on the same DXA machine, at [facility]), which would be 1.5 years after her parathyroid surgery -At last visit I suggested exercise-weightbearing and also gave her that the NOF recommended exercises.  However, she had to start exercising due to back pain (herniated disc) and sciatica.  She is just walking.  4.  Thyroid nodule -No neck compression symptoms -TSH from 02/2019 was normal -We rechecked a thyroid ultrasound on Aug 19, 2018 and the nodule is very small, not worrisome.  No follow-up is needed for this unless she starts developing neck compression symptoms  Orders Placed This Encounter  Procedures  . VITAMIN D 25 Hydroxy (Vit-D Deficiency, Fractures)   Philemon Kingdom, MD PhD [facility]

## 2019-05-27 NOTE — Patient Instructions (Signed)
Please come back for a vitamin D level at your convenience.  Please ask your OB/GYN doctor to order another bone density scan.  Please come back for a follow-up appointment in 1 year.

## 2019-06-03 ENCOUNTER — Encounter: Payer: Self-pay | Admitting: Family Medicine

## 2019-06-04 ENCOUNTER — Other Ambulatory Visit: Payer: Self-pay

## 2019-06-04 DIAGNOSIS — Z8639 Personal history of other endocrine, nutritional and metabolic disease: Secondary | ICD-10-CM

## 2019-06-09 ENCOUNTER — Other Ambulatory Visit: Payer: Self-pay

## 2019-06-11 ENCOUNTER — Ambulatory Visit: Payer: 59 | Attending: Internal Medicine

## 2019-06-11 DIAGNOSIS — Z20822 Contact with and (suspected) exposure to covid-19: Secondary | ICD-10-CM | POA: Insufficient documentation

## 2019-06-12 LAB — NOVEL CORONAVIRUS, NAA: SARS-CoV-2, NAA: NOT DETECTED

## 2019-06-14 ENCOUNTER — Ambulatory Visit: Payer: 59 | Attending: Internal Medicine

## 2019-06-14 DIAGNOSIS — Z20822 Contact with and (suspected) exposure to covid-19: Secondary | ICD-10-CM

## 2019-06-15 ENCOUNTER — Other Ambulatory Visit: Payer: Self-pay

## 2019-06-15 LAB — NOVEL CORONAVIRUS, NAA: SARS-CoV-2, NAA: NOT DETECTED

## 2019-06-16 ENCOUNTER — Other Ambulatory Visit: Payer: Self-pay

## 2019-06-17 ENCOUNTER — Encounter: Payer: Self-pay | Admitting: Family Medicine

## 2019-06-17 ENCOUNTER — Ambulatory Visit (INDEPENDENT_AMBULATORY_CARE_PROVIDER_SITE_OTHER): Payer: 59 | Admitting: Family Medicine

## 2019-06-17 VITALS — BP 108/70 | HR 78 | Temp 97.3°F | Resp 18 | Ht 62.0 in | Wt 152.8 lb

## 2019-06-17 DIAGNOSIS — Z9009 Acquired absence of other part of head and neck: Secondary | ICD-10-CM

## 2019-06-17 DIAGNOSIS — E892 Postprocedural hypoparathyroidism: Secondary | ICD-10-CM

## 2019-06-17 DIAGNOSIS — Z0000 Encounter for general adult medical examination without abnormal findings: Secondary | ICD-10-CM | POA: Diagnosis not present

## 2019-06-17 DIAGNOSIS — I10 Essential (primary) hypertension: Secondary | ICD-10-CM | POA: Diagnosis not present

## 2019-06-17 DIAGNOSIS — E559 Vitamin D deficiency, unspecified: Secondary | ICD-10-CM | POA: Diagnosis not present

## 2019-06-17 MED ORDER — VITAMIN D (ERGOCALCIFEROL) 1.25 MG (50000 UNIT) PO CAPS: ORAL_CAPSULE | ORAL | 0 refills | Status: DC

## 2019-06-17 NOTE — Patient Instructions (Signed)

## 2019-06-17 NOTE — Progress Notes (Signed)
Subjective:     Elizabeth Meyer is a 59 y.o. female and is here for a comprehensive physical exam. The patient reports no problems.  Social History   Socioeconomic History  . Marital status: Married    Spouse name: Not on file  . Number of children: 2  . Years of education: Not on file  . Highest education level: Not on file  Occupational History  . 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 Smoking status: Never Smoker  . Smokeless tobacco: Never Used  Substance and Sexual Activity  . Alcohol use: Yes    Comment: occasional  . Drug use: Never  . Sexual activity: Not on file  Other Topics Concern  . Not on file  Social History Narrative   Married, Aeronautical engineer 2 daughters   1-2 caffeinated beverages/day   Social Determinants of Health   Financial Resource Strain:   . Difficulty of Paying Living Expenses:   Food Insecurity:   . Worried About Charity fundraiser in the Last Year:   . Arboriculturist in the Last Year:   Transportation Needs:   . Film/video editor (Medical):   Marland Kitchen Lack of Transportation (Non-Medical):   Physical Activity:   . Days of Exercise per Week:   . Minutes of Exercise per Session:   Stress:   . Feeling of Stress :   Social Connections:   . Frequency of Communication with Friends and Family:   . Frequency of Social Gatherings with Friends and Family:   . Attends Religious Services:   . Active Member of Clubs or Organizations:   . Attends Archivist Meetings:   Marland Kitchen Marital Status:   Intimate Partner Violence:   . Fear of Current or Ex-Partner:   . Emotionally Abused:   Marland Kitchen Physically Abused:   . Sexually Abused:    Health Maintenance  Topic Date Due  . Hepatitis C Screening  Never done  . HIV Screening  Never done  . TETANUS/TDAP  Never done  . PAP SMEAR-Modifier  01/04/2017  . MAMMOGRAM  06/16/2021  . COLONOSCOPY  04/28/2024  . INFLUENZA VACCINE  Completed    The following portions of the  patient's history were reviewed and updated as appropriate:  She  has a past medical history of Complex cyst of left ovary, Endometrial polyp, Heart murmur (Dr Johnsie Cancel), History of gastrointestinal ulcer, IBS (irritable bowel syndrome), Seasonal allergies, and Wears contact lenses. She does not have any pertinent problems on file. She  has a past surgical history that includes transthoracic echocardiogram (08-12-2007); Plantar fascia surgery (Left, 2009); Colonoscopy with propofol (last one 04-28-2014); Dilatation & currettage/hysteroscopy with resectoscope (N/A, 06/06/2014); Laparoscopic salpingo oophorectomy (Left, 06/06/2014); Cystoscopy (N/A, 06/06/2014); Carpal tunnel release (Left, 05/15/2017); and Parathyroidectomy (Right, 01/13/2018). Her family history includes Arthritis-Osteo in her father; Dementia in her paternal grandmother; Diabetes in her maternal grandfather; Glaucoma in her mother; Heart disease in her mother and paternal grandfather; Hyperlipidemia in her mother; Hypertension in her mother; Tuberculosis in her maternal grandfather and another family member. She  reports that she has never smoked. She has never used smokeless tobacco. She reports current alcohol use. She reports that she does not use drugs. She has a current medication list which includes the following prescription(s): cetirizine, gabapentin, lisinopril-hydrochlorothiazide, naproxen sodium, trazodone, vitamin d (ergocalciferol), ranitidine, and sumatriptan. Current Outpatient Medications on File Prior to Visit  Medication Sig Dispense Refill  . cetirizine (ZYRTEC) 10 MG tablet  Take 1 tablet (10 mg total) by mouth at bedtime. 30 tablet 5  . gabapentin (NEURONTIN) 100 MG capsule TAKE 2 CAPSULES (200 MG TOTAL) BY MOUTH AT BEDTIME. 180 capsule 2  . lisinopril-hydrochlorothiazide (ZESTORETIC) 10-12.5 MG tablet TAKE 1 TABLET BY MOUTH EVERY DAY 90 tablet 1  . naproxen sodium (ALEVE) 220 MG tablet Take 220 mg by mouth daily as needed.     . traZODone (DESYREL) 100 MG tablet TAKE 1 TABLET (100 MG TOTAL) BY MOUTH AT BEDTIME AS NEEDED FOR SLEEP. 90 tablet 1  . ranitidine (ZANTAC) 150 MG tablet TAKE 1 TABLET BY MOUTH TWICE A DAY (Patient not taking: No sig reported) 60 tablet 2  . SUMAtriptan (IMITREX) 100 MG tablet Take 100 mg by mouth every 2 (two) hours as needed for migraine.   1   No current facility-administered medications on file prior to visit.   She has No Known Allergies..  Review of Systems Review of Systems  Constitutional: Negative for activity change, appetite change and fatigue.  HENT: Negative for hearing loss, congestion, tinnitus and ear discharge.  dentist q9m Eyes: Negative for visual disturbance (see optho q1y -- vision corrected to 20/20 with glasses).  Respiratory: Negative for cough, chest tightness and shortness of breath.   Cardiovascular: Negative for chest pain, palpitations and leg swelling.  Gastrointestinal: Negative for abdominal pain, diarrhea, constipation and abdominal distention.  Genitourinary: Negative for urgency, frequency, decreased urine volume and difficulty urinating.  Musculoskeletal: Negative for back pain, arthralgias and gait problem.  Skin: Negative for color change, pallor and rash.  Neurological: Negative for dizziness, light-headedness, numbness and headaches.  Hematological: Negative for adenopathy. Does not bruise/bleed easily.  Psychiatric/Behavioral: Negative for suicidal ideas, confusion, sleep disturbance, self-injury, dysphoric mood, decreased concentration and agitation.       Objective:    BP 108/70 (BP Location: Right Arm, Patient Position: Sitting, Cuff Size: Normal)   Pulse 78   Temp (!) 97.3 F (36.3 C) (Temporal)   Resp 18   Ht 5\' 2"  (1.575 m)   Wt 152 lb 12.8 oz (69.3 kg)   LMP 03/30/2016   SpO2 97%   BMI 27.95 kg/m  General appearance: alert, cooperative, appears stated age and no distress Head: Normocephalic, without obvious abnormality,  atraumatic Eyes: negative findings: lids and lashes normal, conjunctivae and sclerae normal and pupils equal, round, reactive to light and accomodation Ears: normal TM's and external ear canals both ears Nose: Nares normal. Septum midline. Mucosa normal. No drainage or sinus tenderness. Throat: lips, mucosa, and tongue normal; teeth and gums normal Neck: no adenopathy, no carotid bruit, no JVD, supple, symmetrical, trachea midline and thyroid not enlarged, symmetric, no tenderness/mass/nodules Back: symmetric, no curvature. ROM normal. No CVA tenderness. Lungs: clear to auscultation bilaterally Breasts: gyn Heart: regular rate and rhythm, S1, S2 normal, no murmur, click, rub or gallop Abdomen: soft, non-tender; bowel sounds normal; no masses,  no organomegaly Pelvic: gyn Extremities: extremities normal, atraumatic, no cyanosis or edema Pulses: 2+ and symmetric Skin: Skin color, texture, turgor normal. No rashes or lesions Lymph nodes: Cervical, supraclavicular, and axillary nodes normal. Neurologic: Alert and oriented X 3, normal strength and tone. Normal symmetric reflexes. Normal coordination and gait    Assessment:    Healthy female exam.      Plan:    ghm utd Check labs  See After Visit Summary for Counseling Recommendations    1. Vitamin D deficiency Check labs  con't vita d  - Vitamin D, Ergocalciferol, (DRISDOL) 1.25 MG (  50000 UNIT) CAPS capsule; TAKE 1 CAPSULE BY MOUTH ONCE A WEEK  Dispense: 12 capsule; Refill: 0 - Vitamin D (25 hydroxy); Future  2. Essential hypertension Well controlled, no changes to meds. Encouraged heart healthy diet such as the DASH diet and exercise as tolerated.  - Lipid panel; Future - Comprehensive metabolic panel; Future  3. Preventative health care See above  - Lipid panel; Future - CBC with Differential/Platelet; Future - TSH; Future - Comprehensive metabolic panel; Future - Vitamin D (25 hydroxy); Future  4. History of  parathyroidectomy  - PTH, intact (no Ca); Future

## 2019-06-24 ENCOUNTER — Other Ambulatory Visit: Payer: Self-pay

## 2019-06-25 ENCOUNTER — Other Ambulatory Visit: Payer: Self-pay

## 2019-06-25 ENCOUNTER — Other Ambulatory Visit (INDEPENDENT_AMBULATORY_CARE_PROVIDER_SITE_OTHER): Payer: 59

## 2019-06-25 DIAGNOSIS — I10 Essential (primary) hypertension: Secondary | ICD-10-CM

## 2019-06-25 DIAGNOSIS — E559 Vitamin D deficiency, unspecified: Secondary | ICD-10-CM | POA: Diagnosis not present

## 2019-06-25 DIAGNOSIS — Z9009 Acquired absence of other part of head and neck: Secondary | ICD-10-CM | POA: Diagnosis not present

## 2019-06-25 DIAGNOSIS — Z0000 Encounter for general adult medical examination without abnormal findings: Secondary | ICD-10-CM | POA: Diagnosis not present

## 2019-06-25 DIAGNOSIS — E892 Postprocedural hypoparathyroidism: Secondary | ICD-10-CM

## 2019-06-25 LAB — COMPREHENSIVE METABOLIC PANEL
ALT: 12 U/L (ref 0–35)
AST: 14 U/L (ref 0–37)
Albumin: 4 g/dL (ref 3.5–5.2)
Alkaline Phosphatase: 92 U/L (ref 39–117)
BUN: 17 mg/dL (ref 6–23)
CO2: 29 mEq/L (ref 19–32)
Calcium: 9.1 mg/dL (ref 8.4–10.5)
Chloride: 105 mEq/L (ref 96–112)
Creatinine, Ser: 0.82 mg/dL (ref 0.40–1.20)
GFR: 72 mL/min (ref >60.00)
Glucose, Bld: 84 mg/dL (ref 70–99)
Potassium: 3.4 mEq/L — ABNORMAL LOW (ref 3.5–5.1)
Sodium: 141 mEq/L (ref 135–145)
Total Bilirubin: 0.4 mg/dL (ref 0.2–1.2)
Total Protein: 6.5 g/dL (ref 6.0–8.3)

## 2019-06-25 LAB — CBC WITH DIFFERENTIAL/PLATELET
Basophils Absolute: 0 10*3/uL (ref 0.0–0.1)
Basophils Relative: 0.5 % (ref 0.0–3.0)
Eosinophils Absolute: 0.1 10*3/uL (ref 0.0–0.7)
Eosinophils Relative: 3.1 % (ref 0.0–5.0)
HCT: 35.6 % — ABNORMAL LOW (ref 36.0–46.0)
Hemoglobin: 12.3 g/dL (ref 12.0–15.0)
Lymphocytes Relative: 32.7 % (ref 12.0–46.0)
Lymphs Abs: 1.4 10*3/uL (ref 0.7–4.0)
MCHC: 34.6 g/dL (ref 30.0–36.0)
MCV: 90.2 fl (ref 78.0–100.0)
Monocytes Absolute: 0.4 10*3/uL (ref 0.1–1.0)
Monocytes Relative: 9.9 % (ref 3.0–12.0)
Neutro Abs: 2.4 10*3/uL (ref 1.4–7.7)
Neutrophils Relative %: 53.8 % (ref 43.0–77.0)
Platelets: 288 10*3/uL (ref 150.0–400.0)
RBC: 3.95 Mil/uL (ref 3.87–5.11)
RDW: 12.7 % (ref 11.5–15.5)
WBC: 4.4 10*3/uL (ref 4.0–10.5)

## 2019-06-25 LAB — LIPID PANEL
Cholesterol: 195 mg/dL (ref 0–200)
HDL: 68.6 mg/dL (ref >39.00)
LDL Cholesterol: 110 mg/dL — ABNORMAL HIGH (ref 0–99)
NonHDL: 126.72
Total CHOL/HDL Ratio: 3
Triglycerides: 84 mg/dL (ref 0.0–149.0)
VLDL: 16.8 mg/dL (ref 0.0–40.0)

## 2019-06-25 LAB — VITAMIN D 25 HYDROXY (VIT D DEFICIENCY, FRACTURES): VITD: 50.94 ng/mL (ref 30.00–100.00)

## 2019-06-25 LAB — TSH: TSH: 0.58 u[IU]/mL (ref 0.35–4.50)

## 2019-06-28 LAB — PARATHYROID HORMONE, INTACT (NO CA): PTH: 35 pg/mL (ref 14–64)

## 2019-06-28 MED ORDER — POTASSIUM CHLORIDE CRYS ER 20 MEQ PO TBCR: 20.0000 meq | EXTENDED_RELEASE_TABLET | Freq: Every day | ORAL | 2 refills | Status: DC

## 2019-06-28 NOTE — Addendum Note (Signed)
Addended by: Caroll Rancher L on: 06/28/2019 12:13 PM   Modules accepted: Orders

## 2019-06-28 NOTE — Progress Notes (Signed)
RX sent to pharmacy, pt called and given information, she verbalized understanding

## 2019-07-15 ENCOUNTER — Other Ambulatory Visit: Payer: Self-pay | Admitting: Family Medicine

## 2019-07-15 DIAGNOSIS — J069 Acute upper respiratory infection, unspecified: Secondary | ICD-10-CM

## 2019-07-22 ENCOUNTER — Encounter: Payer: Self-pay | Admitting: Family Medicine

## 2019-07-22 NOTE — Telephone Encounter (Signed)
Please advise 

## 2019-07-22 NOTE — Telephone Encounter (Signed)
Women's one a day or she can get a vitamin from health food store,  natures bounty is a good brand too

## 2019-09-07 ENCOUNTER — Other Ambulatory Visit: Payer: Self-pay | Admitting: Family Medicine

## 2019-09-07 DIAGNOSIS — E559 Vitamin D deficiency, unspecified: Secondary | ICD-10-CM

## 2019-09-20 ENCOUNTER — Other Ambulatory Visit: Payer: Self-pay | Admitting: Family Medicine

## 2019-10-14 ENCOUNTER — Other Ambulatory Visit: Payer: Self-pay | Admitting: Family Medicine

## 2019-10-14 DIAGNOSIS — I10 Essential (primary) hypertension: Secondary | ICD-10-CM

## 2019-10-15 ENCOUNTER — Telehealth: Payer: Self-pay | Admitting: Family Medicine

## 2019-10-15 MED ORDER — POTASSIUM CHLORIDE CRYS ER 20 MEQ PO TBCR: 20.0000 meq | EXTENDED_RELEASE_TABLET | Freq: Every day | ORAL | 2 refills | Status: DC

## 2019-10-15 NOTE — Telephone Encounter (Signed)
Refill sent.

## 2019-10-15 NOTE — Telephone Encounter (Signed)
Medication: potassium chloride SA (KLOR-CON) 20 MEQ tablet [[id number]]    Has the patient contacted their pharmacy? No. (If no, request that the patient contact the pharmacy for the refill.) (If yes, when and what did the pharmacy advise?)  Preferred Pharmacy (with phone number or street name):[id number] [place], [place].  [address] [place] [address]  Phone:  [phone] Fax:  [phone]  DEA #:  [id number]   Agent: Please be advised that RX refills may take up to 3 business days. We ask that you follow-up with your pharmacy.

## 2019-11-10 ENCOUNTER — Telehealth (INDEPENDENT_AMBULATORY_CARE_PROVIDER_SITE_OTHER): Payer: No Typology Code available for payment source | Admitting: Family Medicine

## 2019-11-10 ENCOUNTER — Other Ambulatory Visit: Payer: Self-pay

## 2019-11-10 ENCOUNTER — Encounter: Payer: Self-pay | Admitting: Family Medicine

## 2019-11-10 VITALS — BP 128/77 | HR 96 | Temp 100.3°F

## 2019-11-10 DIAGNOSIS — R05 Cough: Secondary | ICD-10-CM | POA: Diagnosis not present

## 2019-11-10 DIAGNOSIS — J0140 Acute pansinusitis, unspecified: Secondary | ICD-10-CM | POA: Diagnosis not present

## 2019-11-10 DIAGNOSIS — R059 Cough, unspecified: Secondary | ICD-10-CM

## 2019-11-10 MED ORDER — PROMETHAZINE-DM 6.25-15 MG/5ML PO SYRP: 5.0000 mL | ORAL_SOLUTION | Freq: Four times a day (QID) | ORAL | 0 refills | Status: DC | PRN

## 2019-11-10 MED ORDER — AMOXICILLIN-POT CLAVULANATE 875-125 MG PO TABS: 1.0000 | ORAL_TABLET | Freq: Two times a day (BID) | ORAL | 0 refills | Status: DC

## 2019-11-10 NOTE — Progress Notes (Signed)
Virtual Visit via Video Note  I connected with Elizabeth Meyer on 11/10/19 at 11:00 AM EDT by a video enabled telemedicine application and verified that I am speaking with the correct person using two identifiers.  Location: Patient: home alone Provider: home    I discussed the limitations of evaluation and management by telemedicine and the availability of in person appointments. The patient expressed understanding and agreed to proceed.  History of Present Illness: Pt states Elizabeth Meyer she started with fever, sore throat , runny nose and congestion.  Yesterday she started with cough.  Pt is achy and is taking mucinex and mucinex dm, vita c , advil and tylenol and zyrtec daily    She took sudafed also -- she knows she should not take that        Observations/Objective: Vitals:   11/10/19 1047  Temp: 100.3 F (37.9 C)   Repeat temp 98.6  Pt is in nad but obviously congestion   + front sinus pressure/ pain No sob   Assessment and Plan: Acute non-recurrent pansinusitis - Plan: amoxicillin-clavulanate (AUGMENTIN) 875-125 MG tablet  Cough - Plan: promethazine-dextromethorphan (PROMETHAZINE-DM) 6.25-15 MG/5ML syrup con't flonase and add antihistamine Avoid sudafed Pt given phone number to get covid test or can go to local pharmacy to get it done  Call back if symptoms do not improve    Follow Up Instructions:    I discussed the assessment and treatment plan with the patient. The patient was provided an opportunity to ask questions and all were answered. The patient agreed with the plan and demonstrated an understanding of the instructions.   The patient was advised to call back or seek an in-person evaluation if the symptoms worsen or if the condition fails to improve as anticipated.  I provided 25 minutes of non-face-to-face time during this encounter.   [name], DO

## 2019-11-25 ENCOUNTER — Other Ambulatory Visit: Payer: Self-pay | Admitting: Family Medicine

## 2019-11-25 DIAGNOSIS — E559 Vitamin D deficiency, unspecified: Secondary | ICD-10-CM

## 2019-12-16 ENCOUNTER — Ambulatory Visit (INDEPENDENT_AMBULATORY_CARE_PROVIDER_SITE_OTHER): Payer: No Typology Code available for payment source | Admitting: Family Medicine

## 2019-12-16 ENCOUNTER — Encounter: Payer: Self-pay | Admitting: Family Medicine

## 2019-12-16 ENCOUNTER — Other Ambulatory Visit: Payer: Self-pay

## 2019-12-16 VITALS — BP 110/76 | HR 72 | Temp 98.3°F | Resp 18 | Ht 62.0 in | Wt 152.4 lb

## 2019-12-16 DIAGNOSIS — I10 Essential (primary) hypertension: Secondary | ICD-10-CM

## 2019-12-16 DIAGNOSIS — E039 Hypothyroidism, unspecified: Secondary | ICD-10-CM | POA: Diagnosis not present

## 2019-12-16 DIAGNOSIS — E559 Vitamin D deficiency, unspecified: Secondary | ICD-10-CM

## 2019-12-16 DIAGNOSIS — E785 Hyperlipidemia, unspecified: Secondary | ICD-10-CM

## 2019-12-16 DIAGNOSIS — R1310 Dysphagia, unspecified: Secondary | ICD-10-CM

## 2019-12-16 LAB — LIPID PANEL
Cholesterol: 237 mg/dL — ABNORMAL HIGH (ref <200)
HDL: 76 mg/dL (ref >=50)
LDL Cholesterol (Calc): 133 mg/dL (calc) — ABNORMAL HIGH
Non-HDL Cholesterol (Calc): 161 mg/dL (calc) — ABNORMAL HIGH (ref <130)
Total CHOL/HDL Ratio: 3.1 (calc) (ref <5.0)
Triglycerides: 153 mg/dL — ABNORMAL HIGH (ref <150)

## 2019-12-16 LAB — COMPREHENSIVE METABOLIC PANEL
AG Ratio: 1.7 (calc) (ref 1.0–2.5)
ALT: 12 U/L (ref 6–29)
AST: 16 U/L (ref 10–35)
Albumin: 4.3 g/dL (ref 3.6–5.1)
Alkaline phosphatase (APISO): 90 U/L (ref 37–153)
BUN: 16 mg/dL (ref 7–25)
CO2: 29 mmol/L (ref 20–32)
Calcium: 9.6 mg/dL (ref 8.6–10.4)
Chloride: 104 mmol/L (ref 98–110)
Creat: 0.77 mg/dL (ref 0.50–1.05)
Globulin: 2.5 g/dL (calc) (ref 1.9–3.7)
Glucose, Bld: 76 mg/dL (ref 65–99)
Potassium: 4.2 mmol/L (ref 3.5–5.3)
Sodium: 141 mmol/L (ref 135–146)
Total Bilirubin: 0.4 mg/dL (ref 0.2–1.2)
Total Protein: 6.8 g/dL (ref 6.1–8.1)

## 2019-12-16 LAB — TSH: TSH: 0.59 mIU/L (ref 0.40–4.50)

## 2019-12-16 LAB — VITAMIN D 25 HYDROXY (VIT D DEFICIENCY, FRACTURES): Vit D, 25-Hydroxy: 81 ng/mL (ref 30–100)

## 2019-12-16 MED ORDER — POTASSIUM CHLORIDE CRYS ER 20 MEQ PO TBCR: 20.0000 meq | EXTENDED_RELEASE_TABLET | Freq: Every day | ORAL | 1 refills | Status: DC

## 2019-12-16 MED ORDER — POTASSIUM CHLORIDE ER 10 MEQ PO TBCR: EXTENDED_RELEASE_TABLET | ORAL | 5 refills | Status: DC

## 2019-12-16 MED ORDER — LISINOPRIL-HYDROCHLOROTHIAZIDE 10-12.5 MG PO TABS: 1.0000 | ORAL_TABLET | Freq: Every day | ORAL | 0 refills | Status: DC

## 2019-12-16 MED ORDER — PANTOPRAZOLE SODIUM 20 MG PO TBEC: 20.0000 mg | DELAYED_RELEASE_TABLET | Freq: Every day | ORAL | 2 refills | Status: DC

## 2019-12-16 NOTE — Assessment & Plan Note (Signed)
Well controlled, no changes to meds. Encouraged heart healthy diet such as the DASH diet and exercise as tolerated.  °

## 2019-12-16 NOTE — Progress Notes (Signed)
Patient ID: Elizabeth Meyer, female    DOB: [birth year]  Age: 59 y.o. MRN: [id number]    Subjective:  Subjective  HPI Elizabeth Meyer presents for f/u potassium , bp and she c/o worsening heartburn and feeling like food may get stuck.  She states its hard to explain   Review of Systems  Constitutional: Negative for appetite change, diaphoresis, fatigue and unexpected weight change.  Eyes: Negative for pain, redness and visual disturbance.  Respiratory: Negative for cough, chest tightness, shortness of breath and wheezing.   Cardiovascular: Negative for chest pain, palpitations and leg swelling.  Endocrine: Negative for cold intolerance, heat intolerance, polydipsia, polyphagia and polyuria.  Genitourinary: Negative for difficulty urinating, dysuria and frequency.  Neurological: Negative for dizziness, light-headedness, numbness and headaches.    History Past Medical History:  Diagnosis Date  . Complex cyst of left ovary   . Endometrial polyp   . Heart murmur Dr Johnsie Cancel   dx 2012--  Benign--  per echo abnormal mitral inflow  . History of gastrointestinal ulcer   . IBS (irritable bowel syndrome)   . Seasonal allergies   . Wears contact lenses     She has a past surgical history that includes transthoracic echocardiogram (08-12-2007); Plantar fascia surgery (Left, 2009); Colonoscopy with propofol (last one 04-28-2014); Dilatation & currettage/hysteroscopy with resectoscope (N/A, 06/06/2014); Laparoscopic salpingo oophorectomy (Left, 06/06/2014); Cystoscopy (N/A, 06/06/2014); Carpal tunnel release (Left, 05/15/2017); and Parathyroidectomy (Right, 01/13/2018).   Her family history includes Arthritis-Osteo in her father; Dementia in her paternal grandmother; Diabetes in her maternal grandfather; Glaucoma in her mother; Heart disease in her mother and paternal grandfather; Hyperlipidemia in her mother; Hypertension in her mother; Tuberculosis in her maternal grandfather and another family  member.She reports that she has never smoked. She has never used smokeless tobacco. She reports current alcohol use. She reports that she does not use drugs.  Current Outpatient Medications on File Prior to Visit  Medication Sig Dispense Refill  . cetirizine (ZYRTEC) 10 MG tablet TAKE 1 TABLET BY MOUTH EVERYDAY AT BEDTIME 90 tablet 1  . gabapentin (NEURONTIN) 100 MG capsule TAKE 2 CAPSULES (200 MG TOTAL) BY MOUTH AT BEDTIME. 180 capsule 2  . naproxen sodium (ALEVE) 220 MG tablet Take 220 mg by mouth daily as needed.    . SUMAtriptan (IMITREX) 100 MG tablet Take 100 mg by mouth every 2 (two) hours as needed for migraine.   1  . traZODone (DESYREL) 100 MG tablet TAKE 1 TABLET (100 MG TOTAL) BY MOUTH AT BEDTIME AS NEEDED FOR SLEEP. 90 tablet 1  . Vitamin D, Ergocalciferol, (DRISDOL) 1.25 MG (50000 UNIT) CAPS capsule TAKE 1 CAPSULE BY MOUTH ONE TIME PER WEEK 12 capsule 0   No current facility-administered medications on file prior to visit.     Objective:  Objective  Physical Exam Vitals and nursing note reviewed.  Constitutional:      Appearance: She is well-developed.  HENT:     Head: Normocephalic and atraumatic.  Eyes:     Conjunctiva/sclera: Conjunctivae normal.  Neck:     Thyroid: No thyromegaly.     Vascular: No carotid bruit or JVD.  Cardiovascular:     Rate and Rhythm: Normal rate and regular rhythm.     Heart sounds: Normal heart sounds. No murmur heard.   Pulmonary:     Effort: Pulmonary effort is normal. No respiratory distress.     Breath sounds: Normal breath sounds. No wheezing or rales.  Chest:     Chest wall:  No tenderness.  Musculoskeletal:     Cervical back: Normal range of motion and neck supple.  Neurological:     Mental Status: She is alert and oriented to person, place, and time.    BP 110/76 (BP Location: Right Arm, Patient Position: Sitting, Cuff Size: Normal)   Pulse 72   Temp 98.3 F (36.8 C) (Oral)   Resp 18   Ht 5\' 2"  (1.575 m)   Wt 152 lb 6.4  oz (69.1 kg)   LMP 03/30/2016   SpO2 100%   BMI 27.87 kg/m  Wt Readings from Last 3 Encounters:  12/16/19 152 lb 6.4 oz (69.1 kg)  06/17/19 152 lb 12.8 oz (69.3 kg)  02/11/19 150 lb (68 kg)     Lab Results  Component Value Date   WBC 4.4 06/25/2019   HGB 12.3 06/25/2019   HCT 35.6 (L) 06/25/2019   PLT 288.0 06/25/2019   GLUCOSE 84 06/25/2019   CHOL 195 06/25/2019   TRIG 84.0 06/25/2019   HDL 68.60 06/25/2019   LDLCALC 110 (H) 06/25/2019   ALT 12 06/25/2019   AST 14 06/25/2019   NA 141 06/25/2019   K 3.4 (L) 06/25/2019   CL 105 06/25/2019   CREATININE 0.82 06/25/2019   BUN 17 06/25/2019   CO2 29 06/25/2019   TSH 0.58 06/25/2019    No results found.   Assessment & Plan:  Plan  I have discontinued Ahaana C. Shasteen's ranitidine, potassium chloride SA, amoxicillin-clavulanate, promethazine-dextromethorphan, and potassium chloride SA. I am also having her start on pantoprazole and potassium chloride. Additionally, I am having her maintain her SUMAtriptan, naproxen sodium, gabapentin, cetirizine, traZODone, Vitamin D (Ergocalciferol), and lisinopril-hydrochlorothiazide.  Meds ordered this encounter  Medications  . lisinopril-hydrochlorothiazide (ZESTORETIC) 10-12.5 MG tablet    Sig: Take 1 tablet by mouth daily.    Dispense:  90 tablet    Refill:  0  . DISCONTD: potassium chloride SA (KLOR-CON) 20 MEQ tablet    Sig: Take 1 tablet (20 mEq total) by mouth daily.    Dispense:  90 tablet    Refill:  1  . pantoprazole (PROTONIX) 20 MG tablet    Sig: Take 1 tablet (20 mg total) by mouth daily.    Dispense:  30 tablet    Refill:  2  . potassium chloride (KLOR-CON) 10 MEQ tablet    Sig: 2 po qd    Dispense:  60 tablet    Refill:  5    Problem List Items Addressed This Visit      Unprioritized   Essential hypertension - Primary    Well controlled, no changes to meds. Encouraged heart healthy diet such as the DASH diet and exercise as tolerated.       Relevant  Medications   lisinopril-hydrochlorothiazide (ZESTORETIC) 10-12.5 MG tablet   Other Relevant Orders   Comprehensive metabolic panel   Lipid panel    Other Visit Diagnoses    Vitamin D deficiency       Relevant Orders   Vitamin D (25 hydroxy)   Dyslipidemia       Relevant Orders   Comprehensive metabolic panel   Lipid panel   Hypothyroidism, unspecified type       Relevant Medications   potassium chloride (KLOR-CON) 10 MEQ tablet   Other Relevant Orders   TSH   Dysphagia, unspecified type       Relevant Medications   pantoprazole (PROTONIX) 20 MG tablet   Other Relevant Orders   Ambulatory referral to Gastroenterology  Follow-up: Return in about 6 months (around 06/14/2020), or if symptoms worsen or fail to improve, for annual exam, fasting.  [name], DO

## 2019-12-16 NOTE — Patient Instructions (Signed)
DASH Eating Plan DASH stands for "Dietary Approaches to Stop Hypertension." The DASH eating plan is a healthy eating plan that has been shown to reduce high blood pressure (hypertension). It may also reduce your risk for type 2 diabetes, heart disease, and stroke. The DASH eating plan may also help with weight loss. What are tips for following this plan?  General guidelines  Avoid eating more than 2,300 mg (milligrams) of salt (sodium) a day. If you have hypertension, you may need to reduce your sodium intake to 1,500 mg a day.  Limit alcohol intake to no more than 1 drink a day for nonpregnant women and 2 drinks a day for men. One drink equals 12 oz of beer, 5 oz of wine, or 1 oz of hard liquor.  Work with your health care provider to maintain a healthy body weight or to lose weight. Ask what an ideal weight is for you.  Get at least 30 minutes of exercise that causes your heart to beat faster (aerobic exercise) most days of the week. Activities may include walking, swimming, or biking.  Work with your health care provider or diet and nutrition specialist (dietitian) to adjust your eating plan to your individual calorie needs. Reading food labels   Check food labels for the amount of sodium per serving. Choose foods with less than 5 percent of the Daily Value of sodium. Generally, foods with less than 300 mg of sodium per serving fit into this eating plan.  To find whole grains, look for the word "whole" as the first word in the ingredient list. Shopping  Buy products labeled as "low-sodium" or "no salt added."  Buy fresh foods. Avoid canned foods and premade or frozen meals. Cooking  Avoid adding salt when cooking. Use salt-free seasonings or herbs instead of table salt or sea salt. Check with your health care provider or pharmacist before using salt substitutes.  Do not fry foods. Cook foods using healthy methods such as baking, boiling, grilling, and broiling instead.  Cook with  heart-healthy oils, such as olive, canola, soybean, or sunflower oil. Meal planning  Eat a balanced diet that includes: ? 5 or more servings of fruits and vegetables each day. At each meal, try to fill half of your plate with fruits and vegetables. ? Up to 6-8 servings of whole grains each day. ? Less than 6 oz of lean meat, poultry, or fish each day. A 3-oz serving of meat is about the same size as a deck of cards. One egg equals 1 oz. ? 2 servings of low-fat dairy each day. ? A serving of nuts, seeds, or beans 5 times each week. ? Heart-healthy fats. Healthy fats called Omega-3 fatty acids are found in foods such as flaxseeds and coldwater fish, like sardines, salmon, and mackerel.  Limit how much you eat of the following: ? Canned or prepackaged foods. ? Food that is high in trans fat, such as fried foods. ? Food that is high in saturated fat, such as fatty meat. ? Sweets, desserts, sugary drinks, and other foods with added sugar. ? Full-fat dairy products.  Do not salt foods before eating.  Try to eat at least 2 vegetarian meals each week.  Eat more home-cooked food and less restaurant, buffet, and fast food.  When eating at a restaurant, ask that your food be prepared with less salt or no salt, if possible. What foods are recommended? The items listed may not be a complete list. Talk with your dietitian about   what dietary choices are best for you. Grains Whole-grain or whole-wheat bread. Whole-grain or whole-wheat pasta. Brown rice. Oatmeal. Quinoa. Bulgur. Whole-grain and low-sodium cereals. Pita bread. Low-fat, low-sodium crackers. Whole-wheat flour tortillas. Vegetables Fresh or frozen vegetables (raw, steamed, roasted, or grilled). Low-sodium or reduced-sodium tomato and vegetable juice. Low-sodium or reduced-sodium tomato sauce and tomato paste. Low-sodium or reduced-sodium canned vegetables. Fruits All fresh, dried, or frozen fruit. Canned fruit in natural juice (without  added sugar). Meat and other protein foods Skinless chicken or turkey. Ground chicken or turkey. Pork with fat trimmed off. Fish and seafood. Egg whites. Dried beans, peas, or lentils. Unsalted nuts, nut butters, and seeds. Unsalted canned beans. Lean cuts of beef with fat trimmed off. Low-sodium, lean deli meat. Dairy Low-fat (1%) or fat-free (skim) milk. Fat-free, low-fat, or reduced-fat cheeses. Nonfat, low-sodium ricotta or cottage cheese. Low-fat or nonfat yogurt. Low-fat, low-sodium cheese. Fats and oils Soft margarine without trans fats. Vegetable oil. Low-fat, reduced-fat, or light mayonnaise and salad dressings (reduced-sodium). Canola, safflower, olive, soybean, and sunflower oils. Avocado. Seasoning and other foods Herbs. Spices. Seasoning mixes without salt. Unsalted popcorn and pretzels. Fat-free sweets. What foods are not recommended? The items listed may not be a complete list. Talk with your dietitian about what dietary choices are best for you. Grains Baked goods made with fat, such as croissants, muffins, or some breads. Dry pasta or rice meal packs. Vegetables Creamed or fried vegetables. Vegetables in a cheese sauce. Regular canned vegetables (not low-sodium or reduced-sodium). Regular canned tomato sauce and paste (not low-sodium or reduced-sodium). Regular tomato and vegetable juice (not low-sodium or reduced-sodium). Pickles. Olives. Fruits Canned fruit in a light or heavy syrup. Fried fruit. Fruit in cream or butter sauce. Meat and other protein foods Fatty cuts of meat. Ribs. Fried meat. Bacon. Sausage. Bologna and other processed lunch meats. Salami. Fatback. Hotdogs. Bratwurst. Salted nuts and seeds. Canned beans with added salt. Canned or smoked fish. Whole eggs or egg yolks. Chicken or turkey with skin. Dairy Whole or 2% milk, cream, and half-and-half. Whole or full-fat cream cheese. Whole-fat or sweetened yogurt. Full-fat cheese. Nondairy creamers. Whipped toppings.  Processed cheese and cheese spreads. Fats and oils Butter. Stick margarine. Lard. Shortening. Ghee. Bacon fat. Tropical oils, such as coconut, palm kernel, or palm oil. Seasoning and other foods Salted popcorn and pretzels. Onion salt, garlic salt, seasoned salt, table salt, and sea salt. Worcestershire sauce. Tartar sauce. Barbecue sauce. Teriyaki sauce. Soy sauce, including reduced-sodium. Steak sauce. Canned and packaged gravies. Fish sauce. Oyster sauce. Cocktail sauce. Horseradish that you find on the shelf. Ketchup. Mustard. Meat flavorings and tenderizers. Bouillon cubes. Hot sauce and Tabasco sauce. Premade or packaged marinades. Premade or packaged taco seasonings. Relishes. Regular salad dressings. Where to find more information:  National Heart, Lung, and Blood Institute: www.nhlbi.nih.gov  American Heart Association: www.heart.org Summary  The DASH eating plan is a healthy eating plan that has been shown to reduce high blood pressure (hypertension). It may also reduce your risk for type 2 diabetes, heart disease, and stroke.  With the DASH eating plan, you should limit salt (sodium) intake to 2,300 mg a day. If you have hypertension, you may need to reduce your sodium intake to 1,500 mg a day.  When on the DASH eating plan, aim to eat more fresh fruits and vegetables, whole grains, lean proteins, low-fat dairy, and heart-healthy fats.  Work with your health care provider or diet and nutrition specialist (dietitian) to adjust your eating plan to your   individual calorie needs. This information is not intended to replace advice given to you by your health care provider. Make sure you discuss any questions you have with your health care provider. Document Revised: 03/07/2017 Document Reviewed: 03/18/2016 Elsevier Patient Education  2020 Elsevier Inc.  

## 2019-12-19 ENCOUNTER — Other Ambulatory Visit: Payer: Self-pay | Admitting: Family Medicine

## 2019-12-19 DIAGNOSIS — E785 Hyperlipidemia, unspecified: Secondary | ICD-10-CM

## 2020-01-03 ENCOUNTER — Encounter: Payer: Self-pay | Admitting: Family Medicine

## 2020-01-03 NOTE — Telephone Encounter (Signed)
One or the other would have been fine but since she has both----  2 fish oil and 2 flaxseed  Divided during the day

## 2020-01-09 ENCOUNTER — Other Ambulatory Visit: Payer: Self-pay | Admitting: Family Medicine

## 2020-02-12 ENCOUNTER — Other Ambulatory Visit: Payer: Self-pay | Admitting: Family Medicine

## 2020-02-12 DIAGNOSIS — E559 Vitamin D deficiency, unspecified: Secondary | ICD-10-CM

## 2020-02-14 NOTE — Telephone Encounter (Signed)
Last vit d was checked  on 12/16/19 it was 81.  Do you want to continue?

## 2020-02-14 NOTE — Telephone Encounter (Signed)
She should be able to take otc vita d3 1000u daily and stop rx  Recheck vita d 3 months after switching to daily

## 2020-02-28 ENCOUNTER — Other Ambulatory Visit: Payer: Self-pay | Admitting: Family Medicine

## 2020-02-28 DIAGNOSIS — R1310 Dysphagia, unspecified: Secondary | ICD-10-CM

## 2020-03-16 ENCOUNTER — Other Ambulatory Visit: Payer: Self-pay | Admitting: Family Medicine

## 2020-03-16 DIAGNOSIS — I10 Essential (primary) hypertension: Secondary | ICD-10-CM

## 2020-03-21 ENCOUNTER — Other Ambulatory Visit: Payer: Self-pay | Admitting: Family Medicine

## 2020-03-28 ENCOUNTER — Encounter: Payer: Self-pay | Admitting: Family Medicine

## 2020-03-29 ENCOUNTER — Telehealth: Payer: No Typology Code available for payment source | Admitting: Family Medicine

## 2020-03-29 ENCOUNTER — Telehealth: Payer: Self-pay

## 2020-03-29 NOTE — Telephone Encounter (Signed)
Pt called. Pt states no SOB, chest pain, dizziness.  Pt advised to seek emergency help if sxs change or become worse. Pt states she will keep track of her pulse with her apple watch.

## 2020-04-04 ENCOUNTER — Ambulatory Visit (INDEPENDENT_AMBULATORY_CARE_PROVIDER_SITE_OTHER): Payer: No Typology Code available for payment source | Admitting: Family Medicine

## 2020-04-04 ENCOUNTER — Encounter: Payer: Self-pay | Admitting: Family Medicine

## 2020-04-04 ENCOUNTER — Other Ambulatory Visit: Payer: Self-pay

## 2020-04-04 VITALS — BP 134/82 | HR 85 | Temp 98.1°F | Ht 62.0 in | Wt 154.2 lb

## 2020-04-04 DIAGNOSIS — R002 Palpitations: Secondary | ICD-10-CM

## 2020-04-04 DIAGNOSIS — R011 Cardiac murmur, unspecified: Secondary | ICD-10-CM | POA: Diagnosis not present

## 2020-04-04 NOTE — Progress Notes (Signed)
Chief Complaint  Patient presents with  . Palpitations    Elizabeth Meyer is a 59 y.o. female here for racing heart beat.  Length of issue: several months How long does it last: a few minutes Light headedness/passing out? No Chest pain/shortness of breath? No Hx of arrhythmia? No Medication changes/illicit substances? No No sig caffeine/alcohol/energy drink.   Past Medical History:  Diagnosis Date  . Complex cyst of left ovary   . Endometrial polyp   . Heart murmur Dr Johnsie Cancel   dx 2012--  Benign--  per echo abnormal mitral inflow  . History of gastrointestinal ulcer   . IBS (irritable bowel syndrome)   . Seasonal allergies   . Wears contact lenses    Past Surgical History:  Procedure Laterality Date  . CARPAL TUNNEL RELEASE Left 05/15/2017   Procedure: LEFT CARPAL TUNNEL RELEASE;  Surgeon: [name], MD;  Location: [facility];  Service: Orthopedics;  Laterality: Left;  . COLONOSCOPY WITH PROPOFOL  last one 04-28-2014  . CYSTOSCOPY N/A 06/06/2014   Procedure: CYSTOSCOPY;  Surgeon: [name], MD;  Location: [facility];  Service: Gynecology;  Laterality: N/A;  . DILATATION & CURRETTAGE/HYSTEROSCOPY WITH RESECTOCOPE N/A 06/06/2014   Procedure: DILATATION & CURETTAGE/HYSTEROSCOPY WITH RESECTOCOPE;  Surgeon: [name], MD;  Location: [facility];  Service: Gynecology;  Laterality: N/A;  . LAPAROSCOPIC SALPINGO OOPHERECTOMY Left 06/06/2014   Procedure: LAPAROSCOPIC LEFT SALPINGO OOPHORECTOMY;  Surgeon: [name], MD;  Location: [facility];  Service: Gynecology;  Laterality: Left;  . PARATHYROIDECTOMY Right 01/13/2018   Procedure: RIGHT SUPERIOR PARATHYROIDECTOMY;  Surgeon: Armandina Gemma, MD;  Location: [facility];  Service: General;  Laterality: Right;  . PLANTAR FASCIA SURGERY Left 2009   topaz procedure  . TRANSTHORACIC ECHOCARDIOGRAM  08-12-2007   normal LV,  ef 65-70%,  abnormal mitral inflow with trivial MR,   trivial PR and TR   No Known Allergies Allergies as of 04/04/2020   No Known Allergies     Medication List       Accurate as of April 04, 2020  2:44 PM. If you have any questions, ask your nurse or doctor.        cetirizine 10 MG tablet Commonly known as: ZYRTEC TAKE 1 TABLET BY MOUTH EVERYDAY AT BEDTIME   gabapentin 100 MG capsule Commonly known as: NEURONTIN TAKE 2 CAPSULES (200 MG TOTAL) BY MOUTH AT BEDTIME.   lisinopril-hydrochlorothiazide 10-12.5 MG tablet Commonly known as: ZESTORETIC TAKE 1 TABLET BY MOUTH EVERY DAY   naproxen sodium 220 MG tablet Commonly known as: ALEVE Take 220 mg by mouth daily as needed.   pantoprazole 20 MG tablet Commonly known as: PROTONIX TAKE 1 TABLET BY MOUTH EVERY DAY   potassium chloride 10 MEQ tablet Commonly known as: KLOR-CON 2 po qd   SUMAtriptan 100 MG tablet Commonly known as: IMITREX Take 100 mg by mouth every 2 (two) hours as needed for migraine.   traZODone 100 MG tablet Commonly known as: DESYREL TAKE 1 TABLET (100 MG TOTAL) BY MOUTH AT BEDTIME AS NEEDED FOR SLEEP.   Vitamin D (Ergocalciferol) 1.25 MG (50000 UNIT) Caps capsule Commonly known as: DRISDOL TAKE 1 CAPSULE BY MOUTH ONE TIME PER WEEK       BP 134/82 (BP Location: Left Arm, Patient Position: Sitting, Cuff Size: Normal)   Pulse 85   Temp 98.1 F (36.7 C) (Oral)   Ht 5\' 2"  (1.575 m)   Wt 154 lb 4 oz (  70 kg)   LMP 03/30/2016   SpO2 95%   BMI 28.21 kg/m  Gen: Awake, alert, appearing stated age Eyes: PERRLA Mouth: MMM Heart: RRR, no bruits, no LE edema Lungs: CTAB, no accessory muscle use Neuro: No cerebellar signs MSK: No muscle group atrophy or asymmetry Psych: Age appropriate judgment and insight, mood/affect WNL  Palpitations - Plan: EKG 12-Lead, CBC, Comprehensive metabolic panel, TSH, Magnesium, Ambulatory referral to Cardiology  Heart murmur  EKG shows NSR, nml axis, no interval abn, no T wave changes, no ST seg  elevatoin/depression. Will ck labs. Doubt AS as she is not having dyspnea or diaphoresis. Will refer to cards for hopeful longer term monitor.  ER if she starts having CP, SOB, dizziness/lightheadedness a/w it.  The patient voiced understanding and agreement to the plan.  Jilda Roche Missael Ferrari 2:44 PM 04/04/20

## 2020-04-04 NOTE — Patient Instructions (Signed)
Your EKG is reassuring.  Give [place] 2-3 business days to get the results of your labs back.   Try to drink 55-60 oz of water daily outside of exercise.  If you do not hear anything about your referral in the next 1-2 weeks, call our office and ask for an update.  Let us know if you need anything.

## 2020-04-05 LAB — COMPREHENSIVE METABOLIC PANEL
ALT: 15 U/L (ref 0–35)
AST: 16 U/L (ref 0–37)
Albumin: 4.3 g/dL (ref 3.5–5.2)
Alkaline Phosphatase: 94 U/L (ref 39–117)
BUN: 20 mg/dL (ref 6–23)
CO2: 28 mEq/L (ref 19–32)
Calcium: 8.9 mg/dL (ref 8.4–10.5)
Chloride: 104 mEq/L (ref 96–112)
Creatinine, Ser: 0.75 mg/dL (ref 0.40–1.20)
GFR: 88.5 mL/min (ref >60.00)
Glucose, Bld: 112 mg/dL — ABNORMAL HIGH (ref 70–99)
Potassium: 3.9 mEq/L (ref 3.5–5.1)
Sodium: 141 mEq/L (ref 135–145)
Total Bilirubin: 0.3 mg/dL (ref 0.2–1.2)
Total Protein: 6.6 g/dL (ref 6.0–8.3)

## 2020-04-05 LAB — CBC
HCT: 34.3 % — ABNORMAL LOW (ref 36.0–46.0)
Hemoglobin: 11.6 g/dL — ABNORMAL LOW (ref 12.0–15.0)
MCHC: 33.7 g/dL (ref 30.0–36.0)
MCV: 89.4 fl (ref 78.0–100.0)
Platelets: 342 10*3/uL (ref 150.0–400.0)
RBC: 3.84 Mil/uL — ABNORMAL LOW (ref 3.87–5.11)
RDW: 12.9 % (ref 11.5–15.5)
WBC: 6.4 10*3/uL (ref 4.0–10.5)

## 2020-04-05 LAB — MAGNESIUM: Magnesium: 2.2 mg/dL (ref 1.5–2.5)

## 2020-04-05 LAB — TSH: TSH: 0.35 u[IU]/mL (ref 0.35–4.50)

## 2020-04-07 ENCOUNTER — Other Ambulatory Visit: Payer: Self-pay | Admitting: Family Medicine

## 2020-04-07 DIAGNOSIS — E559 Vitamin D deficiency, unspecified: Secondary | ICD-10-CM

## 2020-04-10 ENCOUNTER — Ambulatory Visit: Payer: No Typology Code available for payment source | Admitting: Family Medicine

## 2020-04-11 ENCOUNTER — Telehealth: Payer: Self-pay | Admitting: *Deleted

## 2020-04-11 DIAGNOSIS — D649 Anemia, unspecified: Secondary | ICD-10-CM

## 2020-04-11 NOTE — Telephone Encounter (Signed)
Orders entered

## 2020-04-11 NOTE — Telephone Encounter (Signed)
Pt has lab appt 04/13/20 but I do not see any orders in Epic.  Schedule note says cbc, iron studies.  Pt last seen by Dr Carmelia Roller 04/04/20.  Please place orders.

## 2020-04-11 NOTE — Telephone Encounter (Signed)
Thank you :)

## 2020-04-13 ENCOUNTER — Other Ambulatory Visit: Payer: Self-pay

## 2020-04-13 ENCOUNTER — Other Ambulatory Visit (INDEPENDENT_AMBULATORY_CARE_PROVIDER_SITE_OTHER): Payer: No Typology Code available for payment source

## 2020-04-13 DIAGNOSIS — Z8639 Personal history of other endocrine, nutritional and metabolic disease: Secondary | ICD-10-CM

## 2020-04-13 DIAGNOSIS — J302 Other seasonal allergic rhinitis: Secondary | ICD-10-CM | POA: Insufficient documentation

## 2020-04-13 DIAGNOSIS — D649 Anemia, unspecified: Secondary | ICD-10-CM | POA: Diagnosis not present

## 2020-04-13 DIAGNOSIS — Z973 Presence of spectacles and contact lenses: Secondary | ICD-10-CM | POA: Insufficient documentation

## 2020-04-13 DIAGNOSIS — Z8719 Personal history of other diseases of the digestive system: Secondary | ICD-10-CM | POA: Insufficient documentation

## 2020-04-13 DIAGNOSIS — N83292 Other ovarian cyst, left side: Secondary | ICD-10-CM | POA: Insufficient documentation

## 2020-04-13 DIAGNOSIS — R011 Cardiac murmur, unspecified: Secondary | ICD-10-CM | POA: Insufficient documentation

## 2020-04-13 LAB — CBC
HCT: 34.6 % — ABNORMAL LOW (ref 36.0–46.0)
Hemoglobin: 11.8 g/dL — ABNORMAL LOW (ref 12.0–15.0)
MCHC: 34.2 g/dL (ref 30.0–36.0)
MCV: 87.6 fl (ref 78.0–100.0)
Platelets: 337 10*3/uL (ref 150.0–400.0)
RBC: 3.96 Mil/uL (ref 3.87–5.11)
RDW: 12.6 % (ref 11.5–15.5)
WBC: 5.3 10*3/uL (ref 4.0–10.5)

## 2020-04-13 LAB — IBC + FERRITIN
Ferritin: 5.4 ng/mL — ABNORMAL LOW (ref 10.0–291.0)
Iron: 64 ug/dL (ref 42–145)
Saturation Ratios: 14.3 % — ABNORMAL LOW (ref 20.0–50.0)
Transferrin: 319 mg/dL (ref 212.0–360.0)

## 2020-04-13 LAB — VITAMIN D 25 HYDROXY (VIT D DEFICIENCY, FRACTURES): VITD: 41.85 ng/mL (ref 30.00–100.00)

## 2020-04-13 NOTE — Addendum Note (Signed)
Addended byConrad Linden D on: 04/13/2020 01:43 PM   Modules accepted: Orders

## 2020-04-17 ENCOUNTER — Ambulatory Visit (INDEPENDENT_AMBULATORY_CARE_PROVIDER_SITE_OTHER): Payer: No Typology Code available for payment source

## 2020-04-17 ENCOUNTER — Other Ambulatory Visit: Payer: Self-pay

## 2020-04-17 ENCOUNTER — Encounter: Payer: Self-pay | Admitting: Cardiology

## 2020-04-17 ENCOUNTER — Ambulatory Visit (INDEPENDENT_AMBULATORY_CARE_PROVIDER_SITE_OTHER): Payer: No Typology Code available for payment source | Admitting: Cardiology

## 2020-04-17 ENCOUNTER — Ambulatory Visit: Payer: No Typology Code available for payment source | Admitting: Cardiology

## 2020-04-17 VITALS — BP 128/80 | HR 72 | Ht 62.0 in | Wt 150.0 lb

## 2020-04-17 DIAGNOSIS — R000 Tachycardia, unspecified: Secondary | ICD-10-CM | POA: Diagnosis not present

## 2020-04-17 DIAGNOSIS — R0981 Nasal congestion: Secondary | ICD-10-CM

## 2020-04-17 DIAGNOSIS — R011 Cardiac murmur, unspecified: Secondary | ICD-10-CM

## 2020-04-17 DIAGNOSIS — R002 Palpitations: Secondary | ICD-10-CM

## 2020-04-17 DIAGNOSIS — I4510 Unspecified right bundle-branch block: Secondary | ICD-10-CM

## 2020-04-17 DIAGNOSIS — I10 Essential (primary) hypertension: Secondary | ICD-10-CM | POA: Diagnosis not present

## 2020-04-17 HISTORY — DX: Nasal congestion: R09.81

## 2020-04-17 HISTORY — DX: Palpitations: R00.2

## 2020-04-17 HISTORY — DX: Unspecified right bundle-branch block: I45.10

## 2020-04-17 NOTE — Patient Instructions (Signed)
Medication Instructions:  Your physician recommends that you continue on your current medications as directed. Please refer to the Current Medication list given to you today.  *If you need a refill on your cardiac medications before your next appointment, please call your pharmacy*   Lab Work: None. If you have labs (blood work) drawn today and your tests are completely normal, you will receive your results only by: Marland Kitchen MyChart Message (if you have MyChart) OR . A paper copy in the mail If you have any lab test that is abnormal or we need to change your treatment, we will call you to review the results.   Testing/Procedures: A zio monitor was ordered today. It will remain on for 7 days. You will then return monitor and event diary in provided box. It takes 1-2 weeks for report to be downloaded and returned to [place]. We will call you with the results. If monitor falls off or has orange flashing light, please call Zio for further instructions.   Your physician has requested that you have an echocardiogram. Echocardiography is a painless test that uses sound waves to create images of your heart. It provides your doctor with information about the size and shape of your heart and how well your heart's chambers and valves are working. This procedure takes approximately one hour. There are no restrictions for this procedure.     Follow-Up: At [facility], you and your health needs are our priority.  As part of our continuing mission to provide you with exceptional heart care, we have created designated Provider Care Teams.  These Care Teams include your primary Cardiologist (physician) and Advanced Practice Providers (APPs -  Physician Assistants and Nurse Practitioners) who all work together to provide you with the care you need, when you need it.  We recommend signing up for the patient portal called "MyChart".  Sign up information is provided on this After Visit Summary.  MyChart is used to  connect with patients for Virtual Visits (Telemedicine).  Patients are able to view lab/test results, encounter notes, upcoming appointments, etc.  Non-urgent messages can be sent to your provider as well.   To learn more about what you can do with MyChart, go to [email or web address].    Your next appointment:   6 week(s)  The format for your next appointment:   In Person  Provider:   Jenne Campus, MD   Other Instructions   Echocardiogram An echocardiogram is a test that uses sound waves (ultrasound) to produce images of the heart. Images from an echocardiogram can provide important information about:  Heart size and shape.  The size and thickness and movement of your heart's walls.  Heart muscle function and strength.  Heart valve function or if you have stenosis. Stenosis is when the heart valves are too narrow.  If blood is flowing backward through the heart valves (regurgitation).  A tumor or infectious growth around the heart valves.  Areas of heart muscle that are not working well because of poor blood flow or injury from a heart attack.  Aneurysm detection. An aneurysm is a weak or damaged part of an artery wall. The wall bulges out from the normal force of blood pumping through the body. Tell a health care provider about:  Any allergies you have.  All medicines you are taking, including vitamins, herbs, eye drops, creams, and over-the-counter medicines.  Any blood disorders you have.  Any surgeries you have had.  Any medical conditions you have.  Whether you  are pregnant or may be pregnant. What are the risks? Generally, this is a safe test. However, problems may occur, including an allergic reaction to dye (contrast) that may be used during the test. What happens before the test? No specific preparation is needed. You may eat and drink normally. What happens during the test?  You will take off your clothes from the waist up and put on a hospital  gown.  Electrodes or electrocardiogram (ECG)patches may be placed on your chest. The electrodes or patches are then connected to a device that monitors your heart rate and rhythm.  You will lie down on a table for an ultrasound exam. A gel will be applied to your chest to help sound waves pass through your skin.  A handheld device, called a transducer, will be pressed against your chest and moved over your heart. The transducer produces sound waves that travel to your heart and bounce back (or "echo" back) to the transducer. These sound waves will be captured in real-time and changed into images of your heart that can be viewed on a video monitor. The images will be recorded on a computer and reviewed by your health care provider.  You may be asked to change positions or hold your breath for a short time. This makes it easier to get different views or better views of your heart.  In some cases, you may receive contrast through an IV in one of your veins. This can improve the quality of the pictures from your heart. The procedure may vary among health care providers and hospitals.   What can I expect after the test? You may return to your normal, everyday life, including diet, activities, and medicines, unless your health care provider tells you not to do that. Follow these instructions at home:  It is up to you to get the results of your test. Ask your health care provider, or the department that is doing the test, when your results will be ready.  Keep all follow-up visits. This is important. Summary  An echocardiogram is a test that uses sound waves (ultrasound) to produce images of the heart.  Images from an echocardiogram can provide important information about the size and shape of your heart, heart muscle function, heart valve function, and other possible heart problems.  You do not need to do anything to prepare before this test. You may eat and drink normally.  After the  echocardiogram is completed, you may return to your normal, everyday life, unless your health care provider tells you not to do that. This information is not intended to replace advice given to you by your health care provider. Make sure you discuss any questions you have with your health care provider. Document Revised: 11/16/2019 Document Reviewed: 11/16/2019 Elsevier Patient Education  2021 [employer].

## 2020-04-17 NOTE — Addendum Note (Signed)
Addended by: Senaida Ores on: 04/17/2020 09:19 AM   Modules accepted: Orders

## 2020-04-17 NOTE — Progress Notes (Signed)
Cardiology Consultation:    Date:  04/17/2020   ID:  Elizabeth Meyer, DOB 20-Dec-1960, MRN [id number]  PCP:  [name], DO  Cardiologist:  Jenne Campus, MD   Referring MD: Shelda Pal*   Chief Complaint  Patient presents with  . Tachycardia    History of Present Illness:    Elizabeth Meyer is a 60 y.o. female who is being seen today for the evaluation of tachycardia at the request of Shelda Pal*.  For last few months she noted that her heart rate will speed up with an effort.  She can just walk upstairs and she will feel her heart speeding up.  She can just walk and make that and she will feel her heart speeding up.  There is no irregularities there is no skipped beats.  Just simply tachycardia.  There is no shortness of breath there is no swelling associated with sensation of dizziness.  Overall she is trying to be active but cardiovascular event because of tachycardia. She never had any heart trouble.  However she was told to have soft heart murmur and she truly does have it.  She does have family history of some heart issue her mother started having problems at the age of 23.  She got pretty good cholesterol with calculated risk of 10 years only 2.9%.  She never smoked.  Again again she is trying to be more active she is trying to exercise on a regular basis but is afraid because of tachycardia.  She does not have paroxysmal nocturnal dyspnea, there is no swelling of lower extremities there is no worsening of shortness of breath and fatigue while walking.  Past Medical History:  Diagnosis Date  . Allergic dermatitis 09/16/2015  . Complex cyst of left ovary   . Cough 05/30/2017  . 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 Degenerative arthritis of knee, bilateral 05/06/2019  . Elevated LDL cholesterol level 06/17/2017  . Endometrial polyp   . Essential hypertension 01/29/2019  . Heart murmur Dr Johnsie Cancel   dx 2012--  Benign--  per echo abnormal  mitral inflow  . History of gastrointestinal ulcer   . IBS (irritable bowel syndrome)   . Joint swelling 02/11/2019  . Loss of transverse plantar arch 07/27/2014  . Low back pain 09/09/2017  . Lower extremity edema 02/11/2019  . Nonallopathic lesion of cervical region 06/10/2018  . Nonallopathic lesion of lumbosacral region 06/10/2018  . Nonallopathic lesion of rib cage 06/10/2018  . Nonallopathic lesion of sacral region 06/10/2018  . Nonallopathic lesion of thoracic region 06/10/2018  . Patellofemoral syndrome of both knees 12/19/2016   Inj 10/30 bilateral Bilateral injection September 09, 2017 repeat injections in March 17, 2018 August 06, 2018 Durolane approved 01/15/2019.  Injected January 28, 2019  . Pharyngitis 06/21/2014  . Plantar fasciitis, right 07/27/2014  . Post-menopausal 06/17/2017  . Seasonal allergies   . Sinusitis, acute frontal 06/03/2014  . Suprapubic pain 05/30/2017  . Wears contact lenses     Past Surgical History:  Procedure Laterality Date  . CARPAL TUNNEL RELEASE Left 05/15/2017   Procedure: LEFT CARPAL TUNNEL RELEASE;  Surgeon: [name], MD;  Location: [place];  Service: Orthopedics;  Laterality: Left;  . COLONOSCOPY WITH PROPOFOL  last one 04-28-2014  . CYSTOSCOPY N/A 06/06/2014   Procedure: CYSTOSCOPY;  Surgeon: [name], MD;  Location[facility];  Service: Gynecology;  Laterality: N/A;  . DILATATION & CURRETTAGE/HYSTEROSCOPY WITH RESECTOCOPE  N/A 06/06/2014   Procedure: DILATATION & CURETTAGE/HYSTEROSCOPY WITH RESECTOCOPE;  Surgeon: [name], MD;  Location: [facility];  Service: Gynecology;  Laterality: N/A;  . LAPAROSCOPIC SALPINGO OOPHERECTOMY Left 06/06/2014   Procedure: LAPAROSCOPIC LEFT SALPINGO OOPHORECTOMY;  Surgeon: [name], MD;  Location: [place];  Service: Gynecology;  Laterality: Left;  . PARATHYROIDECTOMY Right 01/13/2018   Procedure: RIGHT SUPERIOR PARATHYROIDECTOMY;  Surgeon: Armandina Gemma, MD;  Location: [facility];  Service: General;  Laterality: Right;  . PLANTAR FASCIA SURGERY Left 2009   topaz procedure  . TRANSTHORACIC ECHOCARDIOGRAM  08-12-2007   normal LV,  ef 65-70%,  abnormal mitral inflow with trivial MR,  trivial PR and TR    Current Medications: Current Meds  Medication Sig  . cetirizine (ZYRTEC) 10 MG tablet TAKE 1 TABLET BY MOUTH EVERYDAY AT BEDTIME  . gabapentin (NEURONTIN) 100 MG capsule TAKE 2 CAPSULES (200 MG TOTAL) BY MOUTH AT BEDTIME.  Marland Kitchen lisinopril-hydrochlorothiazide (ZESTORETIC) 10-12.5 MG tablet TAKE 1 TABLET BY MOUTH EVERY DAY  . naproxen sodium (ALEVE) 220 MG tablet Take 220 mg by mouth daily as needed.  . pantoprazole (PROTONIX) 20 MG tablet TAKE 1 TABLET BY MOUTH EVERY DAY  . potassium chloride (KLOR-CON) 10 MEQ tablet 2 po qd  . traZODone (DESYREL) 100 MG tablet TAKE 1 TABLET (100 MG TOTAL) BY MOUTH AT BEDTIME AS NEEDED FOR SLEEP.  . Vitamin D, Ergocalciferol, (DRISDOL) 1.25 MG (50000 UNIT) CAPS capsule TAKE 1 CAPSULE BY MOUTH ONE TIME PER WEEK  . zolpidem (AMBIEN) 10 MG tablet Take 10 mg by mouth daily.     Allergies:   No known allergies   Social History   Socioeconomic History  . Marital status: Married    Spouse name: Not on file  . Number of children: 2  . Years of education: Not on file  . Highest education level: Not on file  Occupational History  . 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 Smoking status: Never Smoker  . Smokeless tobacco: Never Used  Vaping Use  . Vaping Use: Never used  Substance and Sexual Activity  . Alcohol use: Yes    Comment: occasional  . Drug use: Never  . Sexual activity: Not on file  Other Topics Concern  . Not on file  Social History Narrative   Married, Aeronautical engineer 2 daughters   1-2 caffeinated beverages/day   Social Determinants of Health   Financial Resource Strain: Not on file  Food Insecurity: Not on file  Transportation Needs: Not on file   Physical Activity: Not on file  Stress: Not on file  Social Connections: Not on file     Family History: The patient's family history includes Arthritis-Osteo in her father; Dementia in her paternal grandmother; Diabetes in her maternal grandfather; Glaucoma in her mother; Heart disease in her mother and paternal grandfather; Hyperlipidemia in her mother; Hypertension in her mother; Tuberculosis in her maternal grandfather and another family member. There is no history of Colon cancer or Stomach cancer. ROS:   Please see the history of present illness.    All 14 point review of systems negative except as described per history of present illness.  EKGs/Labs/Other Studies Reviewed:    The following studies were reviewed today:   EKG:  EKG is  ordered today.  The ekg ordered today demonstrates normal sinus rhythm normal P interval criteria minimal for LVH, right bundle branch block which is incomplete  Recent  Labs: 04/04/2020: ALT 15; BUN 20; Creatinine, Ser 0.75; Magnesium 2.2; Potassium 3.9; Sodium 141; TSH 0.35 04/13/2020: Hemoglobin 11.8; Platelets 337.0  Recent Lipid Panel    Component Value Date/Time   CHOL 237 (H) 12/16/2019 1042   TRIG 153 (H) 12/16/2019 1042   HDL 76 12/16/2019 1042   CHOLHDL 3.1 12/16/2019 1042   VLDL 16.8 06/25/2019 0750   LDLCALC 133 (H) 12/16/2019 1042    Physical Exam:    VS:  BP 128/80 (BP Location: Left Arm, Patient Position: Sitting)   Pulse 72   Ht 5\' 2"  (1.575 m)   Wt 150 lb (68 kg)   LMP 03/30/2016   SpO2 98%   BMI 27.44 kg/m     Wt Readings from Last 3 Encounters:  04/17/20 150 lb (68 kg)  04/04/20 154 lb 4 oz (70 kg)  12/16/19 152 lb 6.4 oz (69.1 kg)     GEN:  Well nourished, well developed in no acute distress HEENT: Normal NECK: No JVD; No carotid bruits LYMPHATICS: No lymphadenopathy CARDIAC: RRR, soft systolic murmur grade 1/6 best heard right upper portion of the sternum, no rubs, no gallops RESPIRATORY:  Clear to  auscultation without rales, wheezing or rhonchi  ABDOMEN: Soft, non-tender, non-distended MUSCULOSKELETAL:  No edema; No deformity  SKIN: Warm and dry NEUROLOGIC:  Alert and oriented x 3 PSYCHIATRIC:  Normal affect   ASSESSMENT:    1. Rapid heart rate   2. Palpitations   3. Essential hypertension   4. Heart murmur   5. Incomplete right bundle branch block    PLAN:    In order of problems listed above:  1. Rapid heart rate, palpitations.  I will ask her to wear Zio patch for a week to see if she have any significant arrhythmia.  As part of evaluation she will have an echocardiogram done to assess left ventricle ejection fraction.  I did review her TSH which was slightly on the lower side.  Which indicates possibility of hyperthyroidism.  She may require thyroid profile. 2. Essential hypertension: Blood pressure is good today.  We will continue present management. 3. Dyslipidemia: Her LDL is 133 HDL 76 again calculated 10 years risk is 2.9%.  No need to start medication just diet and exercise. 4. Heart murmur: We will schedule her to have an echocardiogram. 5. Incomplete right bundle branch again echocardiogram will be done. 6. Snoring in the future we may investigate possibility of sleep apnea   Medication Adjustments/Labs and Tests Ordered: Current medicines are reviewed at length with the patient today.  Concerns regarding medicines are outlined above.  Orders Placed This Encounter  Procedures  . EKG 12-Lead   No orders of the defined types were placed in this encounter.   Signed, Park Liter, MD, [facility]. 04/17/2020 9:10 AM    [facility]

## 2020-05-08 ENCOUNTER — Ambulatory Visit (HOSPITAL_BASED_OUTPATIENT_CLINIC_OR_DEPARTMENT_OTHER)
Admission: RE | Admit: 2020-05-08 | Discharge: 2020-05-08 | Disposition: A | Discharge status: Home / Self Care | Payer: No Typology Code available for payment source | Source: Ambulatory Visit | Attending: Cardiology | Admitting: Cardiology

## 2020-05-08 ENCOUNTER — Other Ambulatory Visit: Payer: Self-pay

## 2020-05-08 DIAGNOSIS — R002 Palpitations: Secondary | ICD-10-CM | POA: Diagnosis present

## 2020-05-08 DIAGNOSIS — R011 Cardiac murmur, unspecified: Secondary | ICD-10-CM | POA: Diagnosis not present

## 2020-05-10 ENCOUNTER — Other Ambulatory Visit: Payer: Self-pay | Admitting: Family Medicine

## 2020-05-10 DIAGNOSIS — E611 Iron deficiency: Secondary | ICD-10-CM

## 2020-05-10 LAB — ECHOCARDIOGRAM COMPLETE
Area-P 1/2: 2.56 cm2
S' Lateral: 2.67 cm

## 2020-05-17 ENCOUNTER — Other Ambulatory Visit (INDEPENDENT_AMBULATORY_CARE_PROVIDER_SITE_OTHER): Payer: No Typology Code available for payment source

## 2020-05-17 ENCOUNTER — Other Ambulatory Visit: Payer: Self-pay

## 2020-05-17 DIAGNOSIS — E611 Iron deficiency: Secondary | ICD-10-CM

## 2020-05-17 LAB — CBC
HCT: 37 % (ref 36.0–46.0)
Hemoglobin: 12.5 g/dL (ref 12.0–15.0)
MCHC: 33.8 g/dL (ref 30.0–36.0)
MCV: 87.8 fl (ref 78.0–100.0)
Platelets: 283 10*3/uL (ref 150.0–400.0)
RBC: 4.22 Mil/uL (ref 3.87–5.11)
RDW: 13.6 % (ref 11.5–15.5)
WBC: 4.6 10*3/uL (ref 4.0–10.5)

## 2020-05-17 LAB — IBC + FERRITIN
Ferritin: 14.2 ng/mL (ref 10.0–291.0)
Iron: 74 ug/dL (ref 42–145)
Saturation Ratios: 19.7 % — ABNORMAL LOW (ref 20.0–50.0)
Transferrin: 268 mg/dL (ref 212.0–360.0)

## 2020-05-19 ENCOUNTER — Other Ambulatory Visit: Payer: Self-pay | Admitting: Family Medicine

## 2020-05-19 DIAGNOSIS — D649 Anemia, unspecified: Secondary | ICD-10-CM

## 2020-05-19 DIAGNOSIS — E611 Iron deficiency: Secondary | ICD-10-CM

## 2020-05-19 DIAGNOSIS — E039 Hypothyroidism, unspecified: Secondary | ICD-10-CM

## 2020-05-26 ENCOUNTER — Ambulatory Visit (INDEPENDENT_AMBULATORY_CARE_PROVIDER_SITE_OTHER): Payer: No Typology Code available for payment source | Admitting: Internal Medicine

## 2020-05-26 ENCOUNTER — Other Ambulatory Visit (INDEPENDENT_AMBULATORY_CARE_PROVIDER_SITE_OTHER): Payer: No Typology Code available for payment source

## 2020-05-26 ENCOUNTER — Encounter: Payer: Self-pay | Admitting: Internal Medicine

## 2020-05-26 ENCOUNTER — Other Ambulatory Visit: Payer: Self-pay

## 2020-05-26 VITALS — BP 128/88 | HR 86 | Ht 62.0 in | Wt 152.0 lb

## 2020-05-26 DIAGNOSIS — D649 Anemia, unspecified: Secondary | ICD-10-CM

## 2020-05-26 DIAGNOSIS — Z8639 Personal history of other endocrine, nutritional and metabolic disease: Secondary | ICD-10-CM | POA: Diagnosis not present

## 2020-05-26 DIAGNOSIS — E559 Vitamin D deficiency, unspecified: Secondary | ICD-10-CM

## 2020-05-26 DIAGNOSIS — E210 Primary hyperparathyroidism: Secondary | ICD-10-CM | POA: Diagnosis not present

## 2020-05-26 DIAGNOSIS — E611 Iron deficiency: Secondary | ICD-10-CM

## 2020-05-26 DIAGNOSIS — M8580 Other specified disorders of bone density and structure, unspecified site: Secondary | ICD-10-CM | POA: Diagnosis not present

## 2020-05-26 LAB — FECAL OCCULT BLOOD, IMMUNOCHEMICAL: Fecal Occult Bld: NEGATIVE

## 2020-05-26 MED ORDER — VITAMIN D (ERGOCALCIFEROL) 1.25 MG (50000 UNIT) PO CAPS: ORAL_CAPSULE | ORAL | 3 refills | Status: DC

## 2020-05-26 NOTE — Progress Notes (Signed)
Patient ID: Elizabeth Meyer, female   DOB: Jul 19, 1960, 60 y.o.   MRN: [id number]   This visit occurred during the SARS-CoV-2 public health emergency.  Safety protocols were in place, including screening questions prior to the visit, additional usage of staff PPE, and extensive cleaning of exam room while observing appropriate contact time as indicated for disinfecting solutions.   HPI  Elizabeth Meyer is a 60 y.o.-year-old female, initially referred by Dr Tamala Julian, returning for follow-up for history of primary hyperparathyroidism, vitamin D deficiency, and osteopenia.  Last visit 1 year ago (virtual).  She recently developed palpitations and she sees cardiology. These have now improved.  Patient was found to have an elevated calcium during investigation by Dr. Tamala Julian, whom she sees for joint pain (hips, knees).  A PTH level was also found to be high.  She was referred to endocrinology for further investigation.  Reviewed pertinent labs: Lab Results  Component Value Date   PTH 35 06/25/2019   PTH 32 05/25/2018   PTH Comment 05/25/2018   PTH 98 (H) 10/07/2017   PTH 121 (H) 09/09/2017   CALCIUM 8.9 04/04/2020   CALCIUM 9.6 12/16/2019   CALCIUM 9.1 06/25/2019   CALCIUM 8.9 02/11/2019   CALCIUM 9.0 01/29/2019   CALCIUM 9.1 05/25/2018   CALCIUM 10.0 01/09/2018   CALCIUM 10.0 10/07/2017   CALCIUM 10.6 (H) 09/09/2017   CALCIUM 10.2 09/09/2017  Postop, calcium was normal, at 9.4, and PTH greatly decreased, at 30.  No history of osteoporosis but she had osteopenia:  Lumbar spine L1-L4 (L2) Femoral neck (FN) 33% distal radius frax  07/31/2017 ([facility]) -2.4 (-10.7%*) RFN: -1.3 LFN: -0.8 n/a 10-year fracture risk 5.9% 10-year hip fracture risk: 0.3%  02/24/2014 -1.5  n/a   *Statistically significant difference  The DXA report is not clear in comparison between the hip bone mineral densities between 2019 and 2015.  It looks that the bone density have decreased but I cannot tell  whether it is the total hip BMD's that were compared.    She had a high calcitriol level but normal magnesium and phosphorus: Component     Latest Ref Rng & Units 10/07/2017          Vitamin D 1, 25 (OH) Total     18 - 72 pg/mL 84 (H)  Vitamin D3 1, 25 (OH)     pg/mL 41  Vitamin D2 1, 25 (OH)     pg/mL 43  Magnesium     1.5 - 2.5 mg/dL 2.2  Phosphorus     2.3 - 4.6 mg/dL 2.9   24-hour urine calcium was normal but at the upper limit of normal: Component     Latest Ref Rng & Units 10/15/2017  Creatinine, 24H Ur     0.50 - 2.15 g/24 h 0.78  Calcium, 24H Urine     mg/24 h 198   No history of kidney stones.  Thyroid ultrasound (11/13/2017): 1 x 0.9 x 0.6 cm solid/almost completely solid, hypoechoic nodule in the right mid gland, but now sign of parathyroid adenoma.  Indication was for repeat ultrasound in a year.  Technetium sestamibi parathyroid scan (11/13/2017): Was negative for parathyroid adenoma  4D CT of the neck (12/04/2017): No abnormal left-sided finding. On the right, there appears to be relative enlargement of the superior parathyroid gland (marked with double arrows), but this retains a somewhat flattened appearance. This could represent an early adenoma or hyperplasia. Below that on the right, there are 2 small  nodular foci at the tracheoesophageal groove which do not show enhancement on arterial phase and therefore more consistent with small nonpathologic lymph nodes.  She saw Dr. Harlow Asa and had a right superior parathyroidectomy 01/13/2018.  Pathology showed an adenoma of 0.25 g (1.5 x 0.9 x 0.2 cm).  Postoperatively, calcium was normal at 9.4 and PTH was greatly decreased, at 30.  No history of CKD. Last BUN/Cr: Lab Results  Component Value Date   BUN 20 04/04/2020   BUN 16 12/16/2019   CREATININE 0.75 04/04/2020   CREATININE 0.77 12/16/2019   She has a history of vitamin D deficiency.  Latest levels were normal: Lab Results  Component Value Date   VD25OH  41.85 04/13/2020   VD25OH 81 12/16/2019   VD25OH 50.94 06/25/2019   VD25OH 33.13 05/25/2018   VD25OH 34.95 09/09/2017   On ergocalciferol 50,000 units weekly.  Latest TSH was normal but she has a history of TSH levels in the low normal range: Lab Results  Component Value Date   TSH 0.35 04/04/2020   No family history of pituitary tumors, thyroid cancer, or osteoporosis.  Her daughter and her mother  have had kidney stones.   She has a history of carpal tunnel surgeries bilaterally. She has back pain-herniated disc. She also has HTN. She has a history of iron deficiency anemia.  ROS: Constitutional: no weight gain/no weight loss, no fatigue, no subjective hyperthermia, no subjective hypothermia Eyes: no blurry vision, no xerophthalmia ENT: no sore throat, no nodules palpated in neck, no dysphagia, no odynophagia, no hoarseness Cardiovascular: no CP/no SOB/+ palpitations/no leg swelling Respiratory: no cough/no SOB/no wheezing Gastrointestinal: no N/no V/no D/no C/no acid reflux Musculoskeletal: + Muscle aches/+ joint aches Skin: no rashes, no hair loss Neurological: no tremors/no numbness/no tingling/no dizziness  I reviewed pt's medications, allergies, PMH, social hx, family hx, and changes were documented in the history of present illness. Otherwise, unchanged from my initial visit note.  Past Medical History:  Diagnosis Date  . Allergic dermatitis 09/16/2015  . Complex cyst of left ovary   . Cough 05/30/2017  . 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 Degenerative arthritis of knee, bilateral 05/06/2019  . Elevated LDL cholesterol level 06/17/2017  . Endometrial polyp   . Essential hypertension 01/29/2019  . Heart murmur Dr Johnsie Cancel   dx 2012--  Benign--  per echo abnormal mitral inflow  . History of gastrointestinal ulcer   . IBS (irritable bowel syndrome)   . Joint swelling 02/11/2019  . Loss of transverse plantar arch 07/27/2014  . Low back pain 09/09/2017  . Lower  extremity edema 02/11/2019  . Nonallopathic lesion of cervical region 06/10/2018  . Nonallopathic lesion of lumbosacral region 06/10/2018  . Nonallopathic lesion of rib cage 06/10/2018  . Nonallopathic lesion of sacral region 06/10/2018  . Nonallopathic lesion of thoracic region 06/10/2018  . Patellofemoral syndrome of both knees 12/19/2016   Inj 10/30 bilateral Bilateral injection September 09, 2017 repeat injections in March 17, 2018 August 06, 2018 Durolane approved 01/15/2019.  Injected January 28, 2019  . Pharyngitis 06/21/2014  . Plantar fasciitis, right 07/27/2014  . Post-menopausal 06/17/2017  . Seasonal allergies   . Sinusitis, acute frontal 06/03/2014  . Suprapubic pain 05/30/2017  . Wears contact lenses    Past Surgical History:  Procedure Laterality Date  . CARPAL TUNNEL RELEASE Left 05/15/2017   Procedure: LEFT CARPAL TUNNEL RELEASE;  Surgeon: [name], MD;  Location: [place];  Service: Orthopedics;  Laterality: Left;  .  COLONOSCOPY WITH PROPOFOL  last one 04-28-2014  . CYSTOSCOPY N/A 06/06/2014   Procedure: CYSTOSCOPY;  Surgeon: [name], MD;  Location: [facility];  Service: Gynecology;  Laterality: N/A;  . DILATATION & CURRETTAGE/HYSTEROSCOPY WITH RESECTOCOPE N/A 06/06/2014   Procedure: DILATATION & CURETTAGE/HYSTEROSCOPY WITH RESECTOCOPE;  Surgeon: [name], MD;  Location: [place];  Service: Gynecology;  Laterality: N/A;  . LAPAROSCOPIC SALPINGO OOPHERECTOMY Left 06/06/2014   Procedure: LAPAROSCOPIC LEFT SALPINGO OOPHORECTOMY;  Surgeon: [name], MD;  Location: [place];  Service: Gynecology;  Laterality: Left;  . PARATHYROIDECTOMY Right 01/13/2018   Procedure: RIGHT SUPERIOR PARATHYROIDECTOMY;  Surgeon: Armandina Gemma, MD;  Location: [facility];  Service: General;  Laterality: Right;  . PLANTAR FASCIA SURGERY Left 2009   topaz procedure  . TRANSTHORACIC ECHOCARDIOGRAM  08-12-2007   normal LV,  ef 65-70%,  abnormal  mitral inflow with trivial MR,  trivial PR and TR   Social History   Socioeconomic History  . Marital status: Married    Spouse name: Not on file  . Number of children: 2  . Years of education: Not on file  . Highest education level: Not on file  Occupational History  . 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 Smoking status: Never Smoker  . Smokeless tobacco: Never Used  Vaping Use  . Vaping Use: Never used  Substance and Sexual Activity  . Alcohol use: Yes    Comment: occasional  . Drug use: Never  . Sexual activity: Not on file  Other Topics Concern  . Not on file  Social History Narrative   Married, Aeronautical engineer 2 daughters   1-2 caffeinated beverages/day   Social Determinants of Health   Financial Resource Strain: Not on file  Food Insecurity: Not on file  Transportation Needs: Not on file  Physical Activity: Not on file  Stress: Not on file  Social Connections: Not on file  Intimate Partner Violence: Not on file   Current Outpatient Medications on File Prior to Visit  Medication Sig Dispense Refill  . cetirizine (ZYRTEC) 10 MG tablet TAKE 1 TABLET BY MOUTH EVERYDAY AT BEDTIME 90 tablet 1  . gabapentin (NEURONTIN) 100 MG capsule TAKE 2 CAPSULES (200 MG TOTAL) BY MOUTH AT BEDTIME. 180 capsule 2  . lisinopril-hydrochlorothiazide (ZESTORETIC) 10-12.5 MG tablet TAKE 1 TABLET BY MOUTH EVERY DAY 90 tablet 1  . naproxen sodium (ALEVE) 220 MG tablet Take 220 mg by mouth daily as needed.    . pantoprazole (PROTONIX) 20 MG tablet TAKE 1 TABLET BY MOUTH EVERY DAY 90 tablet 1  . potassium chloride (KLOR-CON) 10 MEQ tablet TAKE 2 TABLETS BY MOUTH EVERY DAY 180 tablet 1  . traZODone (DESYREL) 100 MG tablet TAKE 1 TABLET (100 MG TOTAL) BY MOUTH AT BEDTIME AS NEEDED FOR SLEEP. 90 tablet 1  . Vitamin D, Ergocalciferol, (DRISDOL) 1.25 MG (50000 UNIT) CAPS capsule TAKE 1 CAPSULE BY MOUTH ONE TIME PER WEEK 12 capsule 0  . zolpidem (AMBIEN) 10 MG  tablet Take 10 mg by mouth daily.     No current facility-administered medications on file prior to visit.   Allergies  Allergen Reactions  . No Known Allergies    Family History  Problem Relation Age of Onset  . Glaucoma Mother   . Hypertension Mother   . Hyperlipidemia Mother   . Heart disease Mother   . Heart disease Paternal Grandfather   . Tuberculosis Maternal Grandfather   .  Diabetes Maternal Grandfather   . Dementia Paternal Grandmother   . Tuberculosis Other   . Arthritis-Osteo Father   . Colon cancer Neg Hx   . Stomach cancer Neg Hx     PE: BP 128/88   Pulse 86   Ht 5\' 2"  (1.575 m)   Wt 152 lb (68.9 kg)   LMP 03/30/2016   SpO2 96%   BMI 27.80 kg/m  Wt Readings from Last 3 Encounters:  05/26/20 152 lb (68.9 kg)  04/17/20 150 lb (68 kg)  04/04/20 154 lb 4 oz (70 kg)   Constitutional: normal weight, in NAD Eyes: PERRLA, EOMI, no exophthalmos ENT: moist mucous membranes, no thyromegaly, no cervical lymphadenopathy Cardiovascular: RRR, No MRG Respiratory: CTA B Gastrointestinal: abdomen soft, NT, ND, BS+ Musculoskeletal: no deformities, strength intact in all 4 Skin: moist, warm, no rashes Neurological: no tremor with outstretched hands, DTR normal in all 4  Assessment: 1. Hypercalcemia/hyperparathyroidism  2.  History of vitamin D deficiency  3.  Osteopenia  4.  Thyroid nodule  Plan: Patient with history of slightly elevated calcium and also elevated PTH level.  High ACTH was 121.  She also had a history of vitamin D deficiency and at last visit was on replacement with ergocalciferol weekly.  Latest level was reviewed and was normal. -She has no apparent complications from hypercalcemia: No history of nephrolithiasis, no osteoporosis (however, she does have osteopenia), no fractures.  She denied abdominal pain, depression, bone pain.  She did have joint pains for which she was seen Dr. Tamala Julian. -She had extensive investigation with neck ultrasound,  parathyroid scan and 4D CT and the last test finally revealed a parathyroid adenoma -She had parathyroidectomy on 01/13/2018 and this was successful in extracting the 0.25 g right superior adenomatous parathyroid -Calcium and PTH obtained after surgery were normal.  Most recent calcium obtained 12/21 was also normal, at 8.9  2.  History of vitamin D deficiency -At last visit she was on ergocalciferol monthly instead of the recommended weekly, as she forgot doses, however, at last visit, she was taking this every week. She ran out >> refilled today. - latest level was normal 01/22: 41.85 -We will recheck vitamin D level at next visit  3.  Osteopenia -No fractures since last visit -In the past I suggested weightbearing exercises and gave her the NOF recommended exercise regimen.  At last visit she was just walking due to back pain (herniated disc) and sciatica. These are better. -She was due for another DXA scan in 07/2019 ([facility]) >> had this >> need to get the report   4.  Thyroid nodule -No neck compression symptoms -TSH was normal in 03/2020 -Thyroid ultrasound from 08/2018 showed a small nodule, not worrisome. -No follow-up ultrasounds needed unless she develops neck compression symptoms   Philemon Kingdom, MD PhD [facility] Endocrinology

## 2020-05-26 NOTE — Patient Instructions (Signed)
Please restart vitamin D 50,000 units daily  We will ask your OB/GYN doctor to send me the latest bone density scan.  Please come back for a follow-up appointment in 1 year.

## 2020-05-30 ENCOUNTER — Encounter: Payer: Self-pay | Admitting: Family Medicine

## 2020-05-31 NOTE — Telephone Encounter (Signed)
She does need the 6 months f/u I had referred her to GI when we started the protonix -----it can be long term if needed--- but if she is unable to go without it she should have gi app to

## 2020-06-01 ENCOUNTER — Telehealth: Payer: Self-pay | Admitting: *Deleted

## 2020-06-01 NOTE — Telephone Encounter (Signed)
Pt has lab appt tomorrow. There are open / future orders dated 1/6 for IBC + ferritin, cbc w/diff, pathologist smear review with expected date 06/08/20.  There are orders dated 2/11 for urine microscopic and cbc with expected date 2/25.  Can all of these labs be done on 2/25?

## 2020-06-01 NOTE — Telephone Encounter (Signed)
Yes, OK to cancel reg CBC. Ty.

## 2020-06-02 ENCOUNTER — Other Ambulatory Visit (INDEPENDENT_AMBULATORY_CARE_PROVIDER_SITE_OTHER): Payer: No Typology Code available for payment source

## 2020-06-02 ENCOUNTER — Other Ambulatory Visit: Payer: Self-pay

## 2020-06-02 DIAGNOSIS — D649 Anemia, unspecified: Secondary | ICD-10-CM

## 2020-06-02 DIAGNOSIS — E611 Iron deficiency: Secondary | ICD-10-CM

## 2020-06-02 LAB — CBC WITH DIFFERENTIAL/PLATELET
Absolute Monocytes: 464 cells/uL (ref 200–950)
Basophils Absolute: 41 cells/uL (ref 0–200)
Basophils Relative: 0.9 %
Eosinophils Absolute: 261 cells/uL (ref 15–500)
Eosinophils Relative: 5.8 %
HCT: 38.7 % (ref 35.0–45.0)
Hemoglobin: 13 g/dL (ref 11.7–15.5)
Lymphs Abs: 1436 cells/uL (ref 850–3900)
MCH: 29.9 pg (ref 27.0–33.0)
MCHC: 33.6 g/dL (ref 32.0–36.0)
MCV: 89 fL (ref 80.0–100.0)
MPV: 9.7 fL (ref 7.5–12.5)
Monocytes Relative: 10.3 %
Neutro Abs: 2300 cells/uL (ref 1500–7800)
Neutrophils Relative %: 51.1 %
Platelets: 283 10*3/uL (ref 140–400)
RBC: 4.35 10*6/uL (ref 3.80–5.10)
RDW: 12.7 % (ref 11.0–15.0)
Total Lymphocyte: 31.9 %
WBC: 4.5 10*3/uL (ref 3.8–10.8)

## 2020-06-02 LAB — IBC + FERRITIN
Ferritin: 7.2 ng/mL — ABNORMAL LOW (ref 10.0–291.0)
Iron: 100 ug/dL (ref 42–145)
Saturation Ratios: 25.3 % (ref 20.0–50.0)
Transferrin: 282 mg/dL (ref 212.0–360.0)

## 2020-06-02 LAB — URINALYSIS, MICROSCOPIC ONLY

## 2020-06-02 NOTE — Addendum Note (Signed)
Addended by: Kelle Darting A on: 06/02/2020 09:15 AM   Modules accepted: Orders

## 2020-06-05 LAB — PATHOLOGIST SMEAR REVIEW

## 2020-06-06 ENCOUNTER — Other Ambulatory Visit: Payer: Self-pay

## 2020-06-08 ENCOUNTER — Other Ambulatory Visit: Payer: No Typology Code available for payment source

## 2020-06-14 ENCOUNTER — Encounter: Payer: Self-pay | Admitting: Cardiology

## 2020-06-14 ENCOUNTER — Other Ambulatory Visit: Payer: Self-pay

## 2020-06-14 ENCOUNTER — Ambulatory Visit (INDEPENDENT_AMBULATORY_CARE_PROVIDER_SITE_OTHER): Payer: No Typology Code available for payment source | Admitting: Cardiology

## 2020-06-14 VITALS — BP 118/74 | HR 76 | Ht 62.0 in | Wt 152.0 lb

## 2020-06-14 DIAGNOSIS — I4510 Unspecified right bundle-branch block: Secondary | ICD-10-CM

## 2020-06-14 DIAGNOSIS — I10 Essential (primary) hypertension: Secondary | ICD-10-CM

## 2020-06-14 DIAGNOSIS — R002 Palpitations: Secondary | ICD-10-CM | POA: Diagnosis not present

## 2020-06-14 DIAGNOSIS — E7800 Pure hypercholesterolemia, unspecified: Secondary | ICD-10-CM | POA: Diagnosis not present

## 2020-06-14 NOTE — Patient Instructions (Signed)

## 2020-06-14 NOTE — Progress Notes (Signed)
Cardiology Office Note:    Date:  06/14/2020   ID:  Elizabeth Meyer, DOB 1960-09-01, MRN [id number]  PCP:  [name], DO  Cardiologist:  Jenne Campus, MD    Referring MD: Carollee Herter, Alferd Apa, *   Chief Complaint  Patient presents with  . Follow-up  . Results    History of Present Illness:    Elizabeth Meyer is a 60 y.o. female with past medical history significant for essential hypertension, dyslipidemia, heart murmur, incomplete right bundle branch block.  She was referred to [place] because of episodes of palpitations she felt her heart speeding up.  She did wear monitor which did not show any significant arrhythmia and overall she said she is doing much better she denies have any palpitation now.  She did have echocardiogram which did not show any significant valvular pathology, overall left ventricle ejection fraction was normal.  Last time also we discussed the issue of her LDL we calculate her 10 years risk which was very low only 2.9 and no therapy has been required. She comes today to my office to talk about results of her test.  She joined a gym and she does have her do some high intensity training she has no difficulty doing it.  Her heart rate can go to 1 [phone] she is doing fine with it.  Past Medical History:  Diagnosis Date  . Acquired hypothyroidism 06/17/2017  . Allergic dermatitis 09/16/2015  . Complex cyst of left ovary   . Cough 05/30/2017  . 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 Degenerative arthritis of knee, bilateral 05/06/2019  . Elevated LDL cholesterol level 06/17/2017  . Endometrial polyp   . Essential hypertension 01/29/2019  . Heart murmur Dr Johnsie Cancel   dx 2012--  Benign--  per echo abnormal mitral inflow  . History of gastrointestinal ulcer   . Hyperparathyroidism, primary ([place]) 01/11/2018  . IBS (irritable bowel syndrome)   . Incomplete right bundle branch block 04/17/2020  . Joint swelling 02/11/2019  . Loss of transverse plantar arch  07/27/2014  . Low back pain 09/09/2017  . Lower extremity edema 02/11/2019  . Nasal congestion 04/17/2020  . Nonallopathic lesion of cervical region 06/10/2018  . Nonallopathic lesion of lumbosacral region 06/10/2018  . Nonallopathic lesion of rib cage 06/10/2018  . Nonallopathic lesion of sacral region 06/10/2018  . Nonallopathic lesion of thoracic region 06/10/2018  . Palpitations 04/17/2020  . Patellofemoral syndrome of both knees 12/19/2016   Inj 10/30 bilateral Bilateral injection September 09, 2017 repeat injections in March 17, 2018 August 06, 2018 Durolane approved 01/15/2019.  Injected January 28, 2019  . Pharyngitis 06/21/2014  . Plantar fasciitis, right 07/27/2014  . Post-menopausal 06/17/2017  . Seasonal allergies   . Sinusitis, acute frontal 06/03/2014  . Suprapubic pain 05/30/2017  . Wears contact lenses     Past Surgical History:  Procedure Laterality Date  . CARPAL TUNNEL RELEASE Left 05/15/2017   Procedure: LEFT CARPAL TUNNEL RELEASE;  Surgeon: [name], MD;  Location: [place];  Service: Orthopedics;  Laterality: Left;  . COLONOSCOPY WITH PROPOFOL  last one 04-28-2014  . CYSTOSCOPY N/A 06/06/2014   Procedure: CYSTOSCOPY;  Surgeon: [name], MD;  Location: [facility];  Service: Gynecology;  Laterality: N/A;  . DILATATION & CURRETTAGE/HYSTEROSCOPY WITH RESECTOCOPE N/A 06/06/2014   Procedure: DILATATION & CURETTAGE/HYSTEROSCOPY WITH RESECTOCOPE;  Surgeon: [name], MD;  Location: [place];  Service: Gynecology;  Laterality: N/A;  .  LAPAROSCOPIC SALPINGO OOPHERECTOMY Left 06/06/2014   Procedure: LAPAROSCOPIC LEFT SALPINGO OOPHORECTOMY;  Surgeon: [name], MD;  Location: [place];  Service: Gynecology;  Laterality: Left;  . PARATHYROIDECTOMY Right 01/13/2018   Procedure: RIGHT SUPERIOR PARATHYROIDECTOMY;  Surgeon: Armandina Gemma, MD;  Location: [facility];  Service: General;  Laterality: Right;  . PLANTAR FASCIA SURGERY  Left 2009   topaz procedure  . TRANSTHORACIC ECHOCARDIOGRAM  08-12-2007   normal LV,  ef 65-70%,  abnormal mitral inflow with trivial MR,  trivial PR and TR    Current Medications: Current Meds  Medication Sig  . cetirizine (ZYRTEC) 10 MG tablet TAKE 1 TABLET BY MOUTH EVERYDAY AT BEDTIME  . gabapentin (NEURONTIN) 100 MG capsule TAKE 2 CAPSULES (200 MG TOTAL) BY MOUTH AT BEDTIME.  Marland Kitchen lisinopril-hydrochlorothiazide (ZESTORETIC) 10-12.5 MG tablet TAKE 1 TABLET BY MOUTH EVERY DAY  . naproxen sodium (ALEVE) 220 MG tablet Take 220 mg by mouth daily as needed (Joint pain).  . potassium chloride (KLOR-CON) 10 MEQ tablet TAKE 2 TABLETS BY MOUTH EVERY DAY  . traZODone (DESYREL) 100 MG tablet TAKE 1 TABLET (100 MG TOTAL) BY MOUTH AT BEDTIME AS NEEDED FOR SLEEP.  . Vitamin D, Ergocalciferol, (DRISDOL) 1.25 MG (50000 UNIT) CAPS capsule Take 1 capsule 1x a week     Allergies:   Patient has no known allergies.   Social History   Socioeconomic History  . Marital status: Married    Spouse name: Not on file  . Number of children: 2  . Years of education: Not on file  . Highest education level: Not on file  Occupational History  . 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 Smoking status: Never Smoker  . Smokeless tobacco: Never Used  Vaping Use  . Vaping Use: Never used  Substance and Sexual Activity  . Alcohol use: Yes    Comment: occasional  . Drug use: Never  . Sexual activity: Not on file  Other Topics Concern  . Not on file  Social History Narrative   Married, Aeronautical engineer 2 daughters   1-2 caffeinated beverages/day   Social Determinants of Health   Financial Resource Strain: Not on file  Food Insecurity: Not on file  Transportation Needs: Not on file  Physical Activity: Not on file  Stress: Not on file  Social Connections: Not on file     Family History: The patient's family history includes Arthritis-Osteo in her father; Dementia in her  paternal grandmother; Diabetes in her maternal grandfather; Glaucoma in her mother; Heart disease in her mother and paternal grandfather; Hyperlipidemia in her mother; Hypertension in her mother; Tuberculosis in her maternal grandfather and another family member. There is no history of Colon cancer or Stomach cancer. ROS:   Please see the history of present illness.    All 14 point review of systems negative except as described per history of present illness  EKGs/Labs/Other Studies Reviewed:      Recent Labs: 04/04/2020: ALT 15; BUN 20; Creatinine, Ser 0.75; Magnesium 2.2; Potassium 3.9; Sodium 141; TSH 0.35 06/02/2020: Hemoglobin 13.0; Platelets 283  Recent Lipid Panel    Component Value Date/Time   CHOL 237 (H) 12/16/2019 1042   TRIG 153 (H) 12/16/2019 1042   HDL 76 12/16/2019 1042   CHOLHDL 3.1 12/16/2019 1042   VLDL 16.8 06/25/2019 0750   LDLCALC 133 (H) 12/16/2019 1042    Physical Exam:    VS:  BP 118/74 (BP Location: Right Arm, Patient Position:  Sitting)   Pulse 76   Ht 5\' 2"  (1.575 m)   Wt 152 lb (68.9 kg)   LMP 03/30/2016   SpO2 97%   BMI 27.80 kg/m     Wt Readings from Last 3 Encounters:  06/14/20 152 lb (68.9 kg)  05/26/20 152 lb (68.9 kg)  04/17/20 150 lb (68 kg)     GEN:  Well nourished, well developed in no acute distress HEENT: Normal NECK: No JVD; No carotid bruits LYMPHATICS: No lymphadenopathy CARDIAC: RRR, no murmurs, no rubs, no gallops RESPIRATORY:  Clear to auscultation without rales, wheezing or rhonchi  ABDOMEN: Soft, non-tender, non-distended MUSCULOSKELETAL:  No edema; No deformity  SKIN: Warm and dry LOWER EXTREMITIES: no swelling NEUROLOGIC:  Alert and oriented x 3 PSYCHIATRIC:  Normal affect   ASSESSMENT:    1. Essential hypertension   2. Incomplete right bundle branch block   3. Palpitations   4. Elevated LDL cholesterol level    PLAN:    In order of problems listed above:  1. Palpitations.  Monitor did not show any  significant arrhythmia.  Overall things are looking stable.  She is asymptomatic right now I told her beta-blocker could be useful for her situation but since she is asymptomatic she does not want to take it. 2. Incomplete right bundle branch block, likely echocardiogram did not show any significant pathology. 3. Elevated LDL with previous calculation of risk 2.9.  No need to initiate therapy for it. 4. We did talk about healthy lifestyle need to exercise and regular basis which she decided to do.   Medication Adjustments/Labs and Tests Ordered: Current medicines are reviewed at length with the patient today.  Concerns regarding medicines are outlined above.  No orders of the defined types were placed in this encounter.  Medication changes: No orders of the defined types were placed in this encounter.   Signed, Park Liter, MD, [facility] 06/14/2020 3:55 PM    [place]

## 2020-06-19 ENCOUNTER — Encounter: Payer: Self-pay | Admitting: Family Medicine

## 2020-06-19 ENCOUNTER — Ambulatory Visit (INDEPENDENT_AMBULATORY_CARE_PROVIDER_SITE_OTHER): Payer: No Typology Code available for payment source | Admitting: Family Medicine

## 2020-06-19 ENCOUNTER — Other Ambulatory Visit: Payer: Self-pay

## 2020-06-19 VITALS — BP 120/80 | HR 67 | Temp 98.7°F | Resp 18 | Wt 149.8 lb

## 2020-06-19 DIAGNOSIS — R1310 Dysphagia, unspecified: Secondary | ICD-10-CM | POA: Diagnosis not present

## 2020-06-19 DIAGNOSIS — R400 Somnolence: Secondary | ICD-10-CM | POA: Insufficient documentation

## 2020-06-19 DIAGNOSIS — Z1159 Encounter for screening for other viral diseases: Secondary | ICD-10-CM

## 2020-06-19 DIAGNOSIS — I10 Essential (primary) hypertension: Secondary | ICD-10-CM

## 2020-06-19 DIAGNOSIS — E039 Hypothyroidism, unspecified: Secondary | ICD-10-CM | POA: Diagnosis not present

## 2020-06-19 DIAGNOSIS — E785 Hyperlipidemia, unspecified: Secondary | ICD-10-CM

## 2020-06-19 DIAGNOSIS — D509 Iron deficiency anemia, unspecified: Secondary | ICD-10-CM | POA: Diagnosis not present

## 2020-06-19 DIAGNOSIS — R0683 Snoring: Secondary | ICD-10-CM

## 2020-06-19 DIAGNOSIS — G4733 Obstructive sleep apnea (adult) (pediatric): Secondary | ICD-10-CM | POA: Insufficient documentation

## 2020-06-19 LAB — COMPREHENSIVE METABOLIC PANEL
ALT: 13 U/L (ref 0–35)
AST: 16 U/L (ref 0–37)
Albumin: 4.2 g/dL (ref 3.5–5.2)
Alkaline Phosphatase: 87 U/L (ref 39–117)
BUN: 14 mg/dL (ref 6–23)
CO2: 30 mEq/L (ref 19–32)
Calcium: 9.5 mg/dL (ref 8.4–10.5)
Chloride: 104 mEq/L (ref 96–112)
Creatinine, Ser: 0.79 mg/dL (ref 0.40–1.20)
GFR: 83.04 mL/min (ref >60.00)
Glucose, Bld: 92 mg/dL (ref 70–99)
Potassium: 4 mEq/L (ref 3.5–5.1)
Sodium: 140 mEq/L (ref 135–145)
Total Bilirubin: 0.4 mg/dL (ref 0.2–1.2)
Total Protein: 6.6 g/dL (ref 6.0–8.3)

## 2020-06-19 LAB — CBC WITH DIFFERENTIAL/PLATELET
Basophils Absolute: 0.1 10*3/uL (ref 0.0–0.1)
Basophils Relative: 1 % (ref 0.0–3.0)
Eosinophils Absolute: 0.2 10*3/uL (ref 0.0–0.7)
Eosinophils Relative: 3.2 % (ref 0.0–5.0)
HCT: 38.6 % (ref 36.0–46.0)
Hemoglobin: 12.9 g/dL (ref 12.0–15.0)
Lymphocytes Relative: 29.8 % (ref 12.0–46.0)
Lymphs Abs: 1.7 10*3/uL (ref 0.7–4.0)
MCHC: 33.4 g/dL (ref 30.0–36.0)
MCV: 88.1 fl (ref 78.0–100.0)
Monocytes Absolute: 0.4 10*3/uL (ref 0.1–1.0)
Monocytes Relative: 7.7 % (ref 3.0–12.0)
Neutro Abs: 3.2 10*3/uL (ref 1.4–7.7)
Neutrophils Relative %: 58.3 % (ref 43.0–77.0)
Platelets: 295 10*3/uL (ref 150.0–400.0)
RBC: 4.38 Mil/uL (ref 3.87–5.11)
RDW: 14 % (ref 11.5–15.5)
WBC: 5.6 10*3/uL (ref 4.0–10.5)

## 2020-06-19 LAB — LIPID PANEL
Cholesterol: 210 mg/dL — ABNORMAL HIGH (ref 0–200)
HDL: 72.8 mg/dL (ref >39.00)
LDL Cholesterol: 111 mg/dL — ABNORMAL HIGH (ref 0–99)
NonHDL: 136.91
Total CHOL/HDL Ratio: 3
Triglycerides: 128 mg/dL (ref 0.0–149.0)
VLDL: 25.6 mg/dL (ref 0.0–40.0)

## 2020-06-19 LAB — IBC + FERRITIN
Ferritin: 9.9 ng/mL — ABNORMAL LOW (ref 10.0–291.0)
Iron: 107 ug/dL (ref 42–145)
Saturation Ratios: 25.8 % (ref 20.0–50.0)
Transferrin: 296 mg/dL (ref 212.0–360.0)

## 2020-06-19 LAB — T3, FREE: T3, Free: 3.3 pg/mL (ref 2.3–4.2)

## 2020-06-19 LAB — T4, FREE: Free T4: 0.91 ng/dL (ref 0.60–1.60)

## 2020-06-19 LAB — H. PYLORI ANTIBODY, IGG: H Pylori IgG: NEGATIVE

## 2020-06-19 LAB — TSH: TSH: 0.68 u[IU]/mL (ref 0.35–4.50)

## 2020-06-19 MED ORDER — PANTOPRAZOLE SODIUM 20 MG PO TBEC
DELAYED_RELEASE_TABLET | ORAL | 3 refills | Status: DC
Start: 2020-06-19 — End: 2020-09-11

## 2020-06-19 NOTE — Assessment & Plan Note (Signed)
Had to restart protonix ----  She can try pepcid Refer to gi for egd

## 2020-06-19 NOTE — Assessment & Plan Note (Signed)
Encouraged heart healthy diet, increase exercise, avoid trans fats, consider a krill oil cap daily 

## 2020-06-19 NOTE — Assessment & Plan Note (Signed)
Recheck labs 

## 2020-06-19 NOTE — Assessment & Plan Note (Signed)
Refer to pulm for sleep eval

## 2020-06-19 NOTE — Patient Instructions (Signed)
Food Choices for Gastroesophageal Reflux Disease, Adult When you have gastroesophageal reflux disease (GERD), the foods you eat and your eating habits are very important. Choosing the right foods can help ease your discomfort. Think about working with a food expert (dietitian) to help you make good choices. What are tips for following this plan? Reading food labels  Look for foods that are low in saturated fat. Foods that may help with your symptoms include: ? Foods that have less than 5% of daily value (DV) of fat. ? Foods that have 0 grams of trans fat. Cooking  Do not fry your food.  Cook your food by baking, steaming, grilling, or broiling. These are all methods that do not need a lot of fat for cooking.  To add flavor, try to use herbs that are low in spice and acidity. Meal planning  Choose healthy foods that are low in fat, such as: ? Fruits and vegetables. ? Whole grains. ? Low-fat dairy products. ? Lean meats, fish, and poultry.  Eat small meals often instead of eating 3 large meals each day. Eat your meals slowly in a place where you are relaxed. Avoid bending over or lying down until 2-3 hours after eating.  Limit high-fat foods such as fatty meats or fried foods.  Limit your intake of fatty foods, such as oils, butter, and shortening.  Avoid the following as told by your doctor: ? Foods that cause symptoms. These may be different for different people. Keep a food diary to keep track of foods that cause symptoms. ? Alcohol. ? Drinking a lot of liquid with meals. ? Eating meals during the 2-3 hours before bed.   Lifestyle  Stay at a healthy weight. Ask your doctor what weight is healthy for you. If you need to lose weight, work with your doctor to do so safely.  Exercise for at least 30 minutes on 5 or more days each week, or as told by your doctor.  Wear loose-fitting clothes.  Do not smoke or use any products that contain nicotine or tobacco. If you need help  quitting, ask your doctor.  Sleep with the head of your bed higher than your feet. Use a wedge under the mattress or blocks under the bed frame to raise the head of the bed.  Chew sugar-free gum after meals. What foods should eat? Eat a healthy, well-balanced diet of fruits, vegetables, whole grains, low-fat dairy products, lean meats, fish, and poultry. Each person is different. Foods that may cause symptoms in one person may not cause any symptoms in another person. Work with your doctor to find foods that are safe for you. The items listed above may not be a complete list of what you can eat and drink. Contact a food expert for more options.   What foods should I avoid? Limiting some of these foods may help in managing the symptoms of GERD. Everyone is different. Talk with a food expert or your doctor to help you find the exact foods to avoid, if any. Fruits Any fruits prepared with added fat. Any fruits that cause symptoms. For some people, this may include citrus fruits, such as oranges, grapefruit, pineapple, and lemons. Vegetables Deep-fried vegetables. French fries. Any vegetables prepared with added fat. Any vegetables that cause symptoms. For some people, this may include tomatoes and tomato products, chili peppers, onions and garlic, and horseradish. Grains Pastries or quick breads with added fat. Meats and other proteins High-fat meats, such as fatty beef or pork,   hot dogs, ribs, ham, sausage, salami, and bacon. Fried meat or protein, including fried fish and fried chicken. Nuts and nut butters, in large amounts. Dairy Whole milk and chocolate milk. Sour cream. Cream. Ice cream. Cream cheese. Milkshakes. Fats and oils Butter. Margarine. Shortening. Ghee. Beverages Coffee and tea, with or without caffeine. Carbonated beverages. Sodas. Energy drinks. Fruit juice made with acidic fruits, such as orange or grapefruit. Tomato juice. Alcoholic drinks. Sweets and desserts Chocolate and  cocoa. Donuts. Seasonings and condiments Pepper. Peppermint and spearmint. Added salt. Any condiments, herbs, or seasonings that cause symptoms. For some people, this may include curry, hot sauce, or vinegar-based salad dressings. The items listed above may not be a complete list of what you should not eat and drink. Contact a food expert for more options. Questions to ask your doctor Diet and lifestyle changes are often the first steps that are taken to manage symptoms of GERD. If diet and lifestyle changes do not help, talk with your doctor about taking medicines. Where to find more information  [employer] G[employer]intestinal [employer]: aboutgerd.org Summary  When you have GERD, food and lifestyle choices are very important in easing your symptoms.  Eat small meals often instead of 3 large meals a day. Eat your meals slowly and in a place where you are relaxed.  Avoid bending over or lying down until 2-3 hours after eating.  Limit high-fat foods such as fatty meats or fried foods. This information is not intended to replace advice given to you by your health care provider. Make sure you discuss any questions you have with your health care provider. Document Revised: 10/04/2019 Document Reviewed: 10/04/2019 Elsevier Patient Education  2021 Elsevier Inc.  

## 2020-06-19 NOTE — Progress Notes (Signed)
Patient ID: Elizabeth Meyer, female    DOB: 10-11-1960  Age: 60 y.o. MRN: [id number]    Subjective:  Subjective  HPI Elizabeth Meyer presents for an office visit today.  The patient notes feeling well today. She reported that after stopping Protonix her GERD symptoms returned. She reports that the symptoms resolved so she didn't go to referred GI appointment. She reports that her heart palpitation improved after seeing a cardiology specialist. She has a FHx of CAD so she is being monitored by Cardio.   She also complains of frequent snoring at night and  request that a sleep study be conducted. She had COVID in the past and she still has nasal congestion. She has tried Claritin, Zyrtec, [employer], and nasal spray but denies any relief. She denies SOB, fatigue, fever, N/V/D, chest pain, cough, abdominal pain, vaginal bleeding and discharge, and dysuria at this time.    Review of Systems  Constitutional: Negative for chills, fatigue and fever.  HENT: Negative for congestion, ear pain, sinus pressure, sinus pain and sore throat.   Eyes: Negative for pain and discharge.  Respiratory: Negative for cough and shortness of breath.   Cardiovascular: Negative for chest pain, palpitations and leg swelling.  Gastrointestinal: Negative for abdominal pain, blood in stool, constipation, diarrhea, nausea and vomiting.  Genitourinary: Negative for dysuria, frequency, hematuria, urgency, vaginal bleeding and vaginal discharge.  Musculoskeletal: Negative for back pain, myalgias and neck pain.  Skin: Negative for rash.  Neurological: Negative for dizziness and headaches.  Psychiatric/Behavioral:       (+)snoring while sleeping    History Past Medical History:  Diagnosis Date  . Acquired hypothyroidism 06/17/2017  . Allergic dermatitis 09/16/2015  . Complex cyst of left ovary   . Cough 05/30/2017  . 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 Degenerative arthritis of knee, bilateral 05/06/2019  . Elevated  LDL cholesterol level 06/17/2017  . Endometrial polyp   . Essential hypertension 01/29/2019  . Heart murmur Dr Johnsie Cancel   dx 2012--  Benign--  per echo abnormal mitral inflow  . History of gastrointestinal ulcer   . Hyperparathyroidism, primary ([place]) 01/11/2018  . IBS (irritable bowel syndrome)   . Incomplete right bundle branch block 04/17/2020  . Joint swelling 02/11/2019  . Loss of transverse plantar arch 07/27/2014  . Low back pain 09/09/2017  . Lower extremity edema 02/11/2019  . Nasal congestion 04/17/2020  . Nonallopathic lesion of cervical region 06/10/2018  . Nonallopathic lesion of lumbosacral region 06/10/2018  . Nonallopathic lesion of rib cage 06/10/2018  . Nonallopathic lesion of sacral region 06/10/2018  . Nonallopathic lesion of thoracic region 06/10/2018  . Palpitations 04/17/2020  . Patellofemoral syndrome of both knees 12/19/2016   Inj 10/30 bilateral Bilateral injection September 09, 2017 repeat injections in March 17, 2018 August 06, 2018 Durolane approved 01/15/2019.  Injected January 28, 2019  . Pharyngitis 06/21/2014  . Plantar fasciitis, right 07/27/2014  . Post-menopausal 06/17/2017  . Seasonal allergies   . Sinusitis, acute frontal 06/03/2014  . Suprapubic pain 05/30/2017  . Wears contact lenses     She has a past surgical history that includes transthoracic echocardiogram (08-12-2007); Plantar fascia surgery (Left, 2009); Colonoscopy with propofol (last one 04-28-2014); Dilatation & currettage/hysteroscopy with resectoscope (N/A, 06/06/2014); Laparoscopic salpingo oophorectomy (Left, 06/06/2014); Cystoscopy (N/A, 06/06/2014); Carpal tunnel release (Left, 05/15/2017); and Parathyroidectomy (Right, 01/13/2018).   Her family history includes Arthritis-Osteo in her father; Dementia in her paternal grandmother; Diabetes in her maternal grandfather; Glaucoma in her  mother; Heart disease in her mother and paternal grandfather; Hyperlipidemia in her mother; Hypertension in her mother; Tuberculosis in  her maternal grandfather and another family member.She reports that she has never smoked. She has never used smokeless tobacco. She reports current alcohol use. She reports that she does not use drugs.  Current Outpatient Medications on File Prior to Visit  Medication Sig Dispense Refill  . cetirizine (ZYRTEC) 10 MG tablet TAKE 1 TABLET BY MOUTH EVERYDAY AT BEDTIME 90 tablet 1  . gabapentin (NEURONTIN) 100 MG capsule TAKE 2 CAPSULES (200 MG TOTAL) BY MOUTH AT BEDTIME. 180 capsule 2  . lisinopril-hydrochlorothiazide (ZESTORETIC) 10-12.5 MG tablet TAKE 1 TABLET BY MOUTH EVERY DAY 90 tablet 1  . naproxen sodium (ALEVE) 220 MG tablet Take 220 mg by mouth daily as needed (Joint pain).    . potassium chloride (KLOR-CON) 10 MEQ tablet TAKE 2 TABLETS BY MOUTH EVERY DAY 180 tablet 1  . traZODone (DESYREL) 100 MG tablet TAKE 1 TABLET (100 MG TOTAL) BY MOUTH AT BEDTIME AS NEEDED FOR SLEEP. 90 tablet 1  . Vitamin D, Ergocalciferol, (DRISDOL) 1.25 MG (50000 UNIT) CAPS capsule Take 1 capsule 1x a week 12 capsule 3   No current facility-administered medications on file prior to visit.     Objective:  Objective  Physical Exam Constitutional:      General: She is not in acute distress.    Appearance: Normal appearance. She is well-developed. She is not ill-appearing.  HENT:     Head: Normocephalic and atraumatic.     Right Ear: External ear normal.     Left Ear: External ear normal.     Nose: Nose normal.     Mouth/Throat:     Mouth: Mucous membranes are moist.  Eyes:     General:        Right eye: No discharge.        Left eye: No discharge.     Extraocular Movements: Extraocular movements intact.     Pupils: Pupils are equal, round, and reactive to light.  Cardiovascular:     Rate and Rhythm: Normal rate and regular rhythm.     Pulses: Normal pulses.     Heart sounds: Normal heart sounds. No murmur heard.   Pulmonary:     Effort: Pulmonary effort is normal. No respiratory distress.      Breath sounds: Normal breath sounds. No wheezing, rhonchi or rales.  Abdominal:     General: Bowel sounds are normal.     Palpations: Abdomen is soft.     Tenderness: There is no abdominal tenderness.  Musculoskeletal:     Cervical back: Normal range of motion and neck supple.     Right lower leg: No edema.     Left lower leg: No edema.  Skin:    General: Skin is warm and dry.  Neurological:     Mental Status: She is alert and oriented to person, place, and time.  Psychiatric:        Behavior: Behavior normal.    BP 120/80 (BP Location: Left Arm, Patient Position: Sitting, Cuff Size: Normal)   Pulse 67   Temp 98.7 F (37.1 C) (Oral)   Resp 18   Wt 149 lb 12.8 oz (67.9 kg)   LMP 03/30/2016   SpO2 97%   BMI 27.40 kg/m  Wt Readings from Last 3 Encounters:  06/19/20 149 lb 12.8 oz (67.9 kg)  06/14/20 152 lb (68.9 kg)  05/26/20 152 lb (68.9 kg)  Lab Results  Component Value Date   WBC 4.5 06/02/2020   HGB 13.0 06/02/2020   HCT 38.7 06/02/2020   PLT 283 06/02/2020   GLUCOSE 112 (H) 04/04/2020   CHOL 237 (H) 12/16/2019   TRIG 153 (H) 12/16/2019   HDL 76 12/16/2019   LDLCALC 133 (H) 12/16/2019   ALT 15 04/04/2020   AST 16 04/04/2020   NA 141 04/04/2020   K 3.9 04/04/2020   CL 104 04/04/2020   CREATININE 0.75 04/04/2020   BUN 20 04/04/2020   CO2 28 04/04/2020   TSH 0.35 04/04/2020    ECHOCARDIOGRAM COMPLETE  Result Date: 05/10/2020    ECHOCARDIOGRAM REPORT   Patient Name:   MIRISSA LOPRESTI Date of Exam: 05/08/2020 Medical Rec #:  [id number]        Height:       62.0 in Accession #:    [id number]       Weight:       150.0 lb Date of Birth:  [birth year]       BSA:          1.692 m Patient Age:    69 years         BP:           128/80 mmHg Patient Gender: F                HR:           56 bpm. Exam Location:  [facility] Procedure: 2D Echo, 3D Echo, Cardiac Doppler and Color Doppler Indications:    R00.2 Palpitations; R01.1 Murmur  History:        Patient has no prior  history of Echocardiogram examinations.                 Signs/Symptoms:Murmur and Palpitations.  Sonographer:    Geradine Girt Referring Phys: [place]  1. Left ventricular ejection fraction, by estimation, is 60 to 65%. The left ventricle has normal function. The left ventricle has no regional wall motion abnormalities. Left ventricular diastolic parameters are consistent with Grade I diastolic dysfunction (impaired relaxation).  2. Right ventricular systolic function is normal. The right ventricular size is normal. There is normal pulmonary artery systolic pressure.  3. The mitral valve is normal in structure. No evidence of mitral valve regurgitation. No evidence of mitral stenosis.  4. The aortic valve is tricuspid. Aortic valve regurgitation is not visualized. No aortic stenosis is present.  5. The inferior vena cava is normal in size with greater than 50% respiratory variability, suggesting right atrial pressure of 3 mmHg. FINDINGS  Left Ventricle: Left ventricular ejection fraction, by estimation, is 60 to 65%. The left ventricle has normal function. The left ventricle has no regional wall motion abnormalities. The left ventricular internal cavity size was normal in size. There is  no left ventricular hypertrophy. Left ventricular diastolic parameters are consistent with Grade I diastolic dysfunction (impaired relaxation). Normal left ventricular filling pressure. Right Ventricle: The right ventricular size is normal. No increase in right ventricular wall thickness. Right ventricular systolic function is normal. There is normal pulmonary artery systolic pressure. The tricuspid regurgitant velocity is 2.67 m/s, and  with an assumed right atrial pressure of 3 mmHg, the estimated right ventricular systolic pressure is 94.7 mmHg. Left Atrium: Left atrial size was normal in size. Right Atrium: Right atrial size was normal in size. Pericardium: There is no evidence of pericardial effusion.  Mitral Valve: The mitral valve is normal  in structure. No evidence of mitral valve regurgitation. No evidence of mitral valve stenosis. Tricuspid Valve: The tricuspid valve is normal in structure. Tricuspid valve regurgitation is mild . No evidence of tricuspid stenosis. Aortic Valve: The aortic valve is tricuspid. Aortic valve regurgitation is not visualized. No aortic stenosis is present. Pulmonic Valve: The pulmonic valve was normal in structure. Pulmonic valve regurgitation is trivial. No evidence of pulmonic stenosis. Aorta: The aortic root and ascending aorta are structurally normal, with no evidence of dilitation and the ascending aorta was not well visualized. Venous: A normal flow pattern is recorded from the right upper pulmonary vein. The inferior vena cava is normal in size with greater than 50% respiratory variability, suggesting right atrial pressure of 3 mmHg. IAS/Shunts: No atrial level shunt detected by color flow Doppler.  LEFT VENTRICLE PLAX 2D LVIDd:         4.53 cm  Diastology LVIDs:         2.67 cm  LV e' medial:    7.40 cm/s LV PW:         0.86 cm  LV E/e' medial:  9.5 LV IVS:        1.02 cm  LV e' lateral:   8.92 cm/s LVOT diam:     1.90 cm  LV E/e' lateral: 7.9 LV SV:         66 LV SV Index:   39 LVOT Area:     2.84 cm                          3D Volume EF:                         3D EF:        68 %                         LV EDV:       113 ml                         LV ESV:       36 ml                         LV SV:        77 ml RIGHT VENTRICLE RV S prime:     12.50 cm/s TAPSE (M-mode): 1.8 cm LEFT ATRIUM             Index       RIGHT ATRIUM           Index LA diam:        4.00 cm 2.36 cm/m  RA Area:     13.00 cm LA Vol (A2C):   38.4 ml 22.70 ml/m RA Volume:   30.70 ml  18.15 ml/m LA Vol (A4C):   27.1 ml 16.02 ml/m LA Biplane Vol: 33.2 ml 19.63 ml/m  AORTIC VALVE LVOT Vmax:   103.00 cm/s LVOT Vmean:  64.400 cm/s LVOT VTI:    0.233 m  AORTA Ao Root diam: 3.00 cm Ao Asc diam:  3.10 cm  MITRAL VALVE               TRICUSPID VALVE MV Area (PHT): 2.56 cm    TR Peak grad:   28.5 mmHg MV Decel Time: 296 msec    TR Vmax:  267.00 cm/s MV E velocity: 70.30 cm/s MV A velocity: 68.10 cm/s  SHUNTS MV E/A ratio:  1.03        Systemic VTI:  0.23 m                            Systemic Diam: 1.90 cm Shirlee More MD Electronically signed by Shirlee More MD Signature Date/Time: 05/10/2020/5:40:16 PM    Final      Assessment & Plan:  Plan    Meds ordered this encounter  Medications  . pantoprazole (PROTONIX) 20 MG tablet    Sig: Holding for now.    Dispense:  90 tablet    Refill:  3    Problem List Items Addressed This Visit      Unprioritized   Daytime somnolence    Refer to pulm for sleep eval       Relevant Orders   Ambulatory referral to Pulmonology   Dysphagia    Had to restart protonix ----  She can try pepcid Refer to gi for egd       Relevant Medications   pantoprazole (PROTONIX) 20 MG tablet   Other Relevant Orders   CBC with Differential/Platelet   H. pylori antibody, IgG   Ambulatory referral to Gastroenterology   Hyperlipidemia    Encouraged heart healthy diet, increase exercise, avoid trans fats, consider a krill oil cap daily      Relevant Orders   Lipid panel   Comprehensive metabolic panel   Hypothyroidism - Primary    Stable On no meds---  Recheck labs       Relevant Orders   TSH   T3, free   T4, free   Iron deficiency anemia    Recheck labs       Relevant Orders   IBC + Ferritin   Primary hypertension    Well controlled, no changes to meds. Encouraged heart healthy diet such as the DASH diet and exercise as tolerated.       Relevant Orders   Lipid panel   CBC with Differential/Platelet   Comprehensive metabolic panel   Snoring    Refer to pulm for sleep eval       Relevant Orders   Ambulatory referral to Pulmonology    Other Visit Diagnoses    Need for hepatitis C screening test       Relevant Orders   Hepatitis C  antibody      Follow-up: Return if symptoms worsen or fail to improve, for annual exam, fasting.   I,Alexis Bryant,acting as a Education administrator for [employer], DO.,have documented all relevant documentation on the behalf of [name], DO,as directed by  [name], DO while in the presence of [place], DO, have reviewed all documentation for this visit. The documentation on 06/19/20 for the exam, diagnosis, procedures, and orders are all accurate and complete.

## 2020-06-19 NOTE — Assessment & Plan Note (Signed)
Stable On no meds---  Recheck labs

## 2020-06-19 NOTE — Assessment & Plan Note (Signed)
Well controlled, no changes to meds. Encouraged heart healthy diet such as the DASH diet and exercise as tolerated.  °

## 2020-06-20 LAB — HEPATITIS C ANTIBODY
Hepatitis C Ab: NONREACTIVE
SIGNAL TO CUT-OFF: 0.01 (ref <1.00)

## 2020-07-07 ENCOUNTER — Telehealth: Payer: Self-pay | Admitting: Internal Medicine

## 2020-07-07 NOTE — Telephone Encounter (Signed)
Message sent to Dr Radene Knee office requesting a copy of density scan be sent to the office.

## 2020-07-07 NOTE — Telephone Encounter (Signed)
Patient called to find out if we had received the bone density scan results from her OB/GYN. Please advise patient at [phone]. Did state if not received she can contact the other office and request again if necessary.

## 2020-07-25 ENCOUNTER — Encounter: Payer: Self-pay | Admitting: Family Medicine

## 2020-09-11 ENCOUNTER — Encounter: Payer: Self-pay | Admitting: Physician Assistant

## 2020-09-11 ENCOUNTER — Ambulatory Visit (INDEPENDENT_AMBULATORY_CARE_PROVIDER_SITE_OTHER): Payer: No Typology Code available for payment source | Admitting: Physician Assistant

## 2020-09-11 VITALS — BP 112/74 | HR 67 | Ht 62.0 in | Wt 148.0 lb

## 2020-09-11 DIAGNOSIS — K219 Gastro-esophageal reflux disease without esophagitis: Secondary | ICD-10-CM

## 2020-09-11 DIAGNOSIS — R0789 Other chest pain: Secondary | ICD-10-CM

## 2020-09-11 MED ORDER — FAMOTIDINE 20 MG PO TABS: 20.0000 mg | ORAL_TABLET | Freq: Every day | ORAL | 11 refills | Status: DC

## 2020-09-11 NOTE — Patient Instructions (Addendum)
We have sent the following medications to your pharmacy for you to pick up at your convenience: Pepcid 20 mg every morning.   Follow up as needed.  If you are age 58 or older, your body mass index should be between 23-30. Your Body mass index is 27.07 kg/m. If this is out of the aforementioned range listed, please consider follow up with your Primary Care Provider.  If you are age 43 or younger, your body mass index should be between 19-25. Your Body mass index is 27.07 kg/m. If this is out of the aformentioned range listed, please consider follow up with your Primary Care Provider.   __________________________________________________________  The [facility] providers would like to encourage you to use [facility] to communicate with providers for non-urgent requests or questions.  Due to long hold times on the telephone, sending your provider a message by [facility] may be a faster and more efficient way to get a response.  Please allow 48 business hours for a response.  Please remember that this is for non-urgent requests.

## 2020-09-11 NOTE — Progress Notes (Signed)
Chief Complaint: Chest pain  HPI:    [name] is a 60 year old female with a past medical history as listed below, known to Dr. Carlean Purl for her IBS, who was referred to me by Carollee Herter, Alferd Apa, * for a complaint of chest pain.     04/28/2014 colonoscopy was normal.  Repeat recommended 10 years    Today, the patient describes that she has had a chest tightness has been going on for a few months.  Apparently taking her pantoprazole but was told to stop by her PCP and issues restarted.  Explains that this happens after eating.    06/19/2020 patient seen by PCP.  At that time discussed that after stopping Protonix for reflux symptoms return.      Today, patient explains that initially about 4 months ago or so she started with symptoms of a "pain/pressure" at the bottom of her chest which occurred typically right after swallowing her food.  Often this would only last for a few seconds but sometimes it would last for a few minutes and she would have to hold up her arms and walk around to try and get this to go away.  Explained that it felt like she needed to burp but she could not and when she did it was "a real belch".  Went to see her PCP who started on Pantoprazole 40 mg once a day which resolved the symptoms after taking it for 2 months.  Followed up with her PCP who recommended a trial of discontinuing this medicine.  She came right back with her symptoms.  Since then it was recommended she try Pepcid but she thought she had to take this just before eating and never seems to remember to do so, so has not been taking this regularly.  Tells me that symptoms are much much better, but still not completely gone.    Denies fever, chills, weight loss, change in bowel habits or abdominal pain.  Past Medical History:  Diagnosis Date  . Acquired hypothyroidism 06/17/2017  . Allergic dermatitis 09/16/2015  . Complex cyst of left ovary   . Cough 05/30/2017  . 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  Degenerative arthritis of knee, bilateral 05/06/2019  . Elevated LDL cholesterol level 06/17/2017  . Endometrial polyp   . Essential hypertension 01/29/2019  . Heart murmur Dr Johnsie Cancel   dx 2012--  Benign--  per echo abnormal mitral inflow  . History of gastrointestinal ulcer   . Hyperparathyroidism, primary ([place]) 01/11/2018  . IBS (irritable bowel syndrome)   . Incomplete right bundle branch block 04/17/2020  . Joint swelling 02/11/2019  . Loss of transverse plantar arch 07/27/2014  . Low back pain 09/09/2017  . Lower extremity edema 02/11/2019  . Nasal congestion 04/17/2020  . Nonallopathic lesion of cervical region 06/10/2018  . Nonallopathic lesion of lumbosacral region 06/10/2018  . Nonallopathic lesion of rib cage 06/10/2018  . Nonallopathic lesion of sacral region 06/10/2018  . Nonallopathic lesion of thoracic region 06/10/2018  . Palpitations 04/17/2020  . Patellofemoral syndrome of both knees 12/19/2016   Inj 10/30 bilateral Bilateral injection September 09, 2017 repeat injections in March 17, 2018 August 06, 2018 Durolane approved 01/15/2019.  Injected January 28, 2019  . Pharyngitis 06/21/2014  . Plantar fasciitis, right 07/27/2014  . Post-menopausal 06/17/2017  . Seasonal allergies   . Sinusitis, acute frontal 06/03/2014  . Suprapubic pain 05/30/2017  . Wears contact lenses     Past Surgical History:  Procedure Laterality  Date  . CARPAL TUNNEL RELEASE Left 05/15/2017   Procedure: LEFT CARPAL TUNNEL RELEASE;  Surgeon: [name], MD;  Location: [facility];  Service: Orthopedics;  Laterality: Left;  . COLONOSCOPY    . COLONOSCOPY WITH PROPOFOL  last one 04-28-2014  . CYSTOSCOPY N/A 06/06/2014   Procedure: CYSTOSCOPY;  Surgeon: [name], MD;  Location: [facility];  Service: Gynecology;  Laterality: N/A;  . DILATATION & CURRETTAGE/HYSTEROSCOPY WITH RESECTOCOPE N/A 06/06/2014   Procedure: DILATATION & CURETTAGE/HYSTEROSCOPY WITH RESECTOCOPE;  Surgeon: [name], MD;   Location: [place];  Service: Gynecology;  Laterality: N/A;  . LAPAROSCOPIC SALPINGO OOPHERECTOMY Left 06/06/2014   Procedure: LAPAROSCOPIC LEFT SALPINGO OOPHORECTOMY;  Surgeon: [name], MD;  Location: [place];  Service: Gynecology;  Laterality: Left;  . PARATHYROIDECTOMY Right 01/13/2018   Procedure: RIGHT SUPERIOR PARATHYROIDECTOMY;  Surgeon: Armandina Gemma, MD;  Location: [facility];  Service: General;  Laterality: Right;  . PLANTAR FASCIA SURGERY Left 2009   topaz procedure  . TRANSTHORACIC ECHOCARDIOGRAM  08-12-2007   normal LV,  ef 65-70%,  abnormal mitral inflow with trivial MR,  trivial PR and TR    Current Outpatient Medications  Medication Sig Dispense Refill  . cetirizine (ZYRTEC) 10 MG tablet TAKE 1 TABLET BY MOUTH EVERYDAY AT BEDTIME 90 tablet 1  . gabapentin (NEURONTIN) 100 MG capsule TAKE 2 CAPSULES (200 MG TOTAL) BY MOUTH AT BEDTIME. 180 capsule 2  . lisinopril-hydrochlorothiazide (ZESTORETIC) 10-12.5 MG tablet TAKE 1 TABLET BY MOUTH EVERY DAY 90 tablet 1  . naproxen sodium (ALEVE) 220 MG tablet Take 220 mg by mouth daily as needed (Joint pain).    . potassium chloride (KLOR-CON) 10 MEQ tablet TAKE 2 TABLETS BY MOUTH EVERY DAY 180 tablet 1  . traZODone (DESYREL) 100 MG tablet TAKE 1 TABLET (100 MG TOTAL) BY MOUTH AT BEDTIME AS NEEDED FOR SLEEP. 90 tablet 1  . Vitamin D, Ergocalciferol, (DRISDOL) 1.25 MG (50000 UNIT) CAPS capsule Take 1 capsule 1x a week 12 capsule 3   No current facility-administered medications for this visit.    Allergies as of 09/11/2020  . (No Known Allergies)    Family History  Problem Relation Age of Onset  . Glaucoma Mother   . Hypertension Mother   . Hyperlipidemia Mother   . Heart disease Mother   . Heart disease Paternal Grandfather   . Tuberculosis Maternal Grandfather   . Diabetes Maternal Grandfather   . Dementia Paternal Grandmother   . Tuberculosis Other   . Arthritis-Osteo Father   . Colon  cancer Neg Hx   . Stomach cancer Neg Hx   . Esophageal cancer Neg Hx   . Pancreatic cancer Neg Hx     Social History   Socioeconomic History  . Marital status: Married    Spouse name: Not on file  . Number of children: 2  . Years of education: Not on file  . Highest education level: Not on file  Occupational History  . 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 Smoking status: Never Smoker  . Smokeless tobacco: Never Used  Vaping Use  . Vaping Use: Never used  Substance and Sexual Activity  . Alcohol use: Yes    Comment: occasional  . Drug use: Never  . Sexual activity: Not on file  Other Topics Concern  . Not on file  Social History Narrative   Married, Aeronautical engineer 2 daughters   1-2  caffeinated beverages/day   Social Determinants of Health   Financial Resource Strain: Not on file  Food Insecurity: Not on file  Transportation Needs: Not on file  Physical Activity: Not on file  Stress: Not on file  Social Connections: Not on file  Intimate Partner Violence: Not on file    Review of Systems:    Constitutional: No weight loss, fever or chills Skin: No rash Cardiovascular: No chest pain  Respiratory: No SOB  Gastrointestinal: See HPI and otherwise negative Genitourinary: No dysuria Neurological: No headache, dizziness or syncope Musculoskeletal: No new muscle or joint pain Hematologic: No bleeding  Psychiatric: No history of depression or anxiety   Physical Exam:  Vital signs: BP 112/74 (BP Location: Right Arm, Patient Position: Sitting, Cuff Size: Normal)   Pulse 67   Ht 5\' 2"  (1.575 m)   Wt 148 lb (67.1 kg)   LMP 03/30/2016   SpO2 98%   BMI 27.07 kg/m   Constitutional:   Pleasant Caucasian female appears to be in NAD, Well developed, Well nourished, alert and cooperative Head:  Normocephalic and atraumatic. Eyes:   PEERL, EOMI. No icterus. Conjunctiva pink. Ears:  Normal auditory acuity. Neck:  Supple Throat: Oral  cavity and pharynx without inflammation, swelling or lesion.  Respiratory: Respirations even and unlabored. Lungs clear to auscultation bilaterally.   No wheezes, crackles, or rhonchi.  Cardiovascular: Normal S1, S2. No MRG. Regular rate and rhythm. No peripheral edema, cyanosis or pallor.  Gastrointestinal:  Soft, nondistended, nontender. No rebound or guarding. Normal bowel sounds. No appreciable masses or hepatomegaly. Rectal:  Not performed.  Msk:  Symmetrical without gross deformities. Without edema, no deformity or joint abnormality.  Neurologic:  Alert and  oriented x4;  grossly normal neurologically.  Skin:   Dry and intact without significant lesions or rashes. Psychiatric:  Demonstrates good judgement and reason without abnormal affect or behaviors.  RELEVANT LABS AND IMAGING: CBC    Component Value Date/Time   WBC 5.6 06/19/2020 0909   RBC 4.38 06/19/2020 0909   HGB 12.9 06/19/2020 0909   HCT 38.6 06/19/2020 0909   PLT 295.0 06/19/2020 0909   MCV 88.1 06/19/2020 0909   MCH 29.9 06/02/2020 0914   MCHC 33.4 06/19/2020 0909   RDW 14.0 06/19/2020 0909   LYMPHSABS 1.7 06/19/2020 0909   MONOABS 0.4 06/19/2020 0909   EOSABS 0.2 06/19/2020 0909   BASOSABS 0.1 06/19/2020 0909    CMP     Component Value Date/Time   NA 140 06/19/2020 0909   K 4.0 06/19/2020 0909   CL 104 06/19/2020 0909   CO2 30 06/19/2020 0909   GLUCOSE 92 06/19/2020 0909   BUN 14 06/19/2020 0909   CREATININE 0.79 06/19/2020 0909   CREATININE 0.77 12/16/2019 1042   CALCIUM 9.5 06/19/2020 0909   PROT 6.6 06/19/2020 0909   ALBUMIN 4.2 06/19/2020 0909   AST 16 06/19/2020 0909   ALT 13 06/19/2020 0909   ALKPHOS 87 06/19/2020 0909   BILITOT 0.4 06/19/2020 0909   GFRNONAA >60 01/09/2018 1607   GFRNONAA 78 10/07/2017 1440   GFRAA >60 01/09/2018 1607   GFRAA 90 10/07/2017 1440    Assessment: 1.  Atypical chest pain: Just after swallowing food seem to get a chest pain, better with pantoprazole 40 mg  daily x2 months, now since being off the symptoms have returned slightly; consider GERD+/-esophagitis +/-esophageal stricture  2.  GERD: Likely cause of above  Plan: 1.  Discussed possible EGD for further evaluation and patient tells  me that right now she is feeling some better and would prefer to wait on this. 2.  Started the patient on Pepcid 20 mg every morning.  Discussed that she should try taking this every day and see if this helps enough with symptoms.  If it does then she can continue this.  If it does not she will let us know and would recommend that she trial Pepcid 40 mg every morning.  Can titrate up like this and/ or trial of PPI 20 mg to see if this helps. 3.  Patient does not wish to make a follow-up appointment and would prefer to call if things get worse.  Ellouise Newer, PA-C [facility] 09/11/2020, 3:24 PM  Cc: Carollee Herter, Alferd Apa, *

## 2020-09-12 ENCOUNTER — Encounter: Payer: Self-pay | Admitting: Internal Medicine

## 2020-09-22 NOTE — Progress Notes (Signed)
   I, [name], LAT, ATC, am serving as scribe for Dr. Lynne Leader.  Elizabeth Meyer is a 60 y.o. female who presents to [place] at [facility] today for R foot pain.  She was last seen by Dr. Tamala Julian on 01/28/19 for B knee pain.  Since then, she reports R foot pain ongoing since mid-March. Pt has a hx of plantar fascial release on the L foot in 2010. Pt reports increased activity and wearing older sneakers. Pt loves to play pickle ball. She locates her pain to medial aspect of the heel. Pt notes this pain in her heel really increased about 10 days ago.  Swelling: yes Aggravating factors: weight bearing Treatments tried: decreased activity, stretching, ice massage, orthotics   Pertinent review of systems: No fevers or chills  Relevant historical information: Hypertension   Exam:  BP 122/76   Pulse 72   Ht 5\' 2"  (1.575 m)   Wt 146 lb 3.2 oz (66.3 kg)   LMP 03/30/2016   SpO2 99%   BMI 26.74 kg/m  General: Well Developed, well nourished, and in no acute distress.   MSK: Right foot normal-appearing tender palpation medial plantar calcaneus.  Nontender otherwise.  Normal foot and ankle motion normal strength.  Pulses capillary refill and sensation are intact distally    Lab and Radiology Results  Diagnostic Limited MSK Ultrasound of: Right calcaneus plantar fascia Plantar fascial thick measuring greater than 5 mm.  No visible tear.  Small osteophyte plantar calcaneus Impression: Planter fasciitis without tear      Assessment and Plan: 60 y.o. female with right Planter fasciitis.  Patient has point tenderness medial plantar calcaneus which is consistent with plantar fasciitis.  No obvious tear seen on ultrasound.  Discussed options.  Plan for cam walker boot temporarily and focus on eccentric strength exercises And ice massage and cushioning.  If not improving could consider steroid injection.  Patient would like to avoid steroid injection as well.  Recheck  back in a few weeks if not improving or as needed.   PDMP not reviewed this encounter. Orders Placed This Encounter  Procedures   [place] LIMITED JOINT SPACE STRUCTURES LOW RIGHT(NO LINKED CHARGES)    Standing Status:   Future    Number of Occurrences:   1    Standing Expiration Date:   03/27/2021    Order Specific Question:   Reason for Exam (SYMPTOM  OR DIAGNOSIS REQUIRED)    Answer:   right foot pain    Order Specific Question:   Preferred imaging location?    Answer:   [facility]   No orders of the defined types were placed in this encounter.    Discussed warning signs or symptoms. Please see discharge instructions. Patient expresses understanding.   The above documentation has been reviewed and is accurate and complete Lynne Leader, M.D.

## 2020-09-23 ENCOUNTER — Other Ambulatory Visit: Payer: Self-pay | Admitting: Family Medicine

## 2020-09-23 DIAGNOSIS — I10 Essential (primary) hypertension: Secondary | ICD-10-CM

## 2020-09-25 ENCOUNTER — Other Ambulatory Visit: Payer: Self-pay

## 2020-09-25 ENCOUNTER — Ambulatory Visit: Payer: Self-pay

## 2020-09-25 ENCOUNTER — Ambulatory Visit (INDEPENDENT_AMBULATORY_CARE_PROVIDER_SITE_OTHER): Payer: No Typology Code available for payment source | Admitting: Family Medicine

## 2020-09-25 VITALS — BP 122/76 | HR 72 | Ht 62.0 in | Wt 146.2 lb

## 2020-09-25 DIAGNOSIS — M722 Plantar fascial fibromatosis: Secondary | ICD-10-CM

## 2020-09-25 NOTE — Patient Instructions (Signed)
Thank you for coming in today.   Do the heel lift exercises go from up to down slowly.  Do about 10 -30 2-3x daily  Ice massage in the evening.   Night splint  Heel cushion.   Cam walker boot as needed. Do not drive in it.   If not ok I can do an injection.  Let me know if you are not doing well.   Typically follow up in 4-6 weeks.  Recheck with myself or Dr Tamala Julian.

## 2020-09-27 ENCOUNTER — Ambulatory Visit: Payer: No Typology Code available for payment source | Admitting: Family Medicine

## 2020-10-12 NOTE — Progress Notes (Signed)
10/13/20- 59 yoF never smoker for sleep evaluation courtesy of Dr Carollee Herter with concern of Daytime Somnolence and Snoring Medical problem list includes  HTN, IBS, Hypothyroid, Hyperparathyroid, Seasonal Allergy, Covid infection August 2021,  Epworth score-1 Body weight today-148 lbs Covid vax-3 Moderna -----Sleep apnea, snoring for 1 year, more allergy symptoms since covid august 2021 Meds include Zyrtec, trazodone 100, Husband has commented on snoring over past year at least. She stays somewhat tired, not rested but without intrusive sleepiness. Takes several sedating meds.  Persistent nasal congestion mainly at night, with watery rhinorrhea worse since Covid. Has tried several antihistamines. Borderline glaucoma- advised to avoid steroid Flonase.  No hx ENT surgery, heart or lung disease. Saw ENT a year ago with no suggestions.  Mother used CPAP.  Prior to Admission medications   Medication Sig Start Date End Date Taking? Authorizing Provider  cetirizine (ZYRTEC) 10 MG tablet TAKE 1 TABLET BY MOUTH EVERYDAY AT BEDTIME 07/16/19  Yes Lowne Lyndal Pulley R, DO  famotidine (PEPCID) 20 MG tablet Take 1 tablet (20 mg total) by mouth daily. 09/11/20  Yes Levin Erp, PA  gabapentin (NEURONTIN) 100 MG capsule TAKE 2 CAPSULES (200 MG TOTAL) BY MOUTH AT BEDTIME. 01/10/20  Yes Hulan Saas M, DO  lisinopril-hydrochlorothiazide (ZESTORETIC) 10-12.5 MG tablet TAKE 1 TABLET BY MOUTH EVERY DAY 09/25/20  Yes Roma Schanz R, DO  montelukast (SINGULAIR) 10 MG tablet Take 1 tablet (10 mg total) by mouth at bedtime. 10/13/20  Yes Keondre Markson, Tarri Fuller D, MD  naproxen sodium (ALEVE) 220 MG tablet Take 220 mg by mouth daily as needed (Joint pain).   Yes [provider]  pantoprazole (PROTONIX) 20 MG tablet Take 20 mg by mouth daily. 09/24/20  Yes [provider]  potassium chloride (KLOR-CON) 10 MEQ tablet TAKE 2 TABLETS BY MOUTH EVERY DAY 05/19/20  Yes Carollee Herter, Kendrick Fries R, DO  traZODone  (DESYREL) 100 MG tablet TAKE 1 TABLET BY MOUTH AT BEDTIME AS NEEDED FOR SLEEP. 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   Degenerative arthritis of knee, bilateral 05/06/2019   Elevated LDL cholesterol level 06/17/2017   Endometrial polyp    Essential hypertension 01/29/2019   Heart murmur Dr Johnsie Cancel   dx 2012--  Benign--  per echo abnormal mitral inflow   History of gastrointestinal ulcer    Hyperparathyroidism, primary ([place]) 01/11/2018   IBS (irritable bowel syndrome)    Incomplete right bundle branch block 04/17/2020   Joint swelling 02/11/2019   Loss of transverse plantar arch 07/27/2014   Low back pain 09/09/2017   Lower extremity edema 02/11/2019   Nasal congestion 04/17/2020   Nonallopathic lesion of cervical region 06/10/2018   Nonallopathic lesion of lumbosacral region 06/10/2018   Nonallopathic lesion of rib cage 06/10/2018   Nonallopathic lesion of sacral region 06/10/2018   Nonallopathic lesion of thoracic region 06/10/2018   Palpitations 04/17/2020   Patellofemoral syndrome of both knees 12/19/2016   Inj 10/30 bilateral Bilateral injection September 09, 2017 repeat injections in March 17, 2018 August 06, 2018 Durolane approved 01/15/2019.  Injected January 28, 2019   Pharyngitis 06/21/2014   Plantar fasciitis, right 07/27/2014  Post-menopausal 06/17/2017   Seasonal allergies    Sinusitis, acute frontal 06/03/2014   Suprapubic pain 05/30/2017   Wears contact lenses    Past Surgical History:  Procedure Laterality Date   CARPAL TUNNEL RELEASE Left 05/15/2017   Procedure: LEFT CARPAL TUNNEL RELEASE;  Surgeon: [name], MD;  Location: [facility];  Service:  Orthopedics;  Laterality: Left;   COLONOSCOPY     COLONOSCOPY WITH PROPOFOL  last one 04-28-2014   CYSTOSCOPY N/A 06/06/2014   Procedure: CYSTOSCOPY;  Surgeon: [name], MD;  Location: [facility];  Service: Gynecology;  Laterality: N/A;   DILATATION & CURRETTAGE/HYSTEROSCOPY WITH RESECTOCOPE N/A 06/06/2014   Procedure: DILATATION & CURETTAGE/HYSTEROSCOPY WITH RESECTOCOPE;  Surgeon: [name], MD;  Location: [facility];  Service: Gynecology;  Laterality: N/A;   LAPAROSCOPIC SALPINGO OOPHERECTOMY Left 06/06/2014   Procedure: LAPAROSCOPIC LEFT SALPINGO OOPHORECTOMY;  Surgeon: [name], MD;  Location: [facility];  Service: Gynecology;  Laterality: Left;   PARATHYROIDECTOMY Right 01/13/2018   Procedure: RIGHT SUPERIOR PARATHYROIDECTOMY;  Surgeon: Armandina Gemma, MD;  Location: [facility];  Service: General;  Laterality: Right;   PLANTAR FASCIA SURGERY Left 2009   topaz procedure   TRANSTHORACIC ECHOCARDIOGRAM  08-12-2007   normal LV,  ef 65-70%,  abnormal mitral inflow with trivial MR,  trivial PR and TR   Family History  Problem Relation Age of Onset   Glaucoma Mother    Hypertension Mother    Hyperlipidemia Mother    Heart disease Mother    Heart disease Paternal Grandfather    Tuberculosis Maternal Grandfather    Diabetes Maternal Grandfather    Dementia Paternal Grandmother    Tuberculosis Other    Arthritis-Osteo Father    Colon cancer Neg Hx    Stomach cancer Neg Hx    Esophageal cancer Neg Hx    Pancreatic cancer Neg Hx    Social History   Socioeconomic History   Marital status: Married    Spouse name: Not on file   Number of children: 2   Years of education: Not on file   Highest education level: Not on file  Occupational History   Occupation: Print production planner    Employer: [employer]  Tobacco Use   Smoking status: Never   Smokeless tobacco: Never  Vaping Use   Vaping Use: Never used  Substance and Sexual Activity    Alcohol use: Yes    Comment: occasional   Drug use: Never   Sexual activity: Not on file  Other Topics Concern   Not on file  Social History Narrative   Married, Aeronautical engineer 2 daughters   1-2 caffeinated beverages/day   Social Determinants of Health   Financial Resource Strain: Not on file  Food Insecurity: Not on file  Transportation Needs: Not on file  Physical Activity: Not on file  Stress: Not on file  Social Connections: Not on file  Intimate Partner Violence: Not on file   ROS-see HPI   + = positive Constitutional:    weight loss, night sweats, fevers, chills, fatigue, lassitude. HEENT:    +headaches, difficulty swallowing, tooth/dental problems, sore throat,       +sneezing, itching, ear ache, +nasal congestion, post nasal drip, snoring CV:    chest pain, orthopnea, PND, swelling in lower extremities, anasarca,  dizziness, palpitations Resp:   shortness of breath with exertion or at rest.                productive cough,   non-productive cough, coughing up of blood.              change in color of mucus.  wheezing.   Skin:    rash or lesions. GI:  +heartburn, indigestion, abdominal pain, nausea, vomiting, diarrhea,                 change in bowel habits, loss of appetite GU: dysuria, change in color of urine, no urgency or frequency.   flank pain. MS:   joint pain, stiffness, decreased range of motion, back pain. Neuro-     nothing unusual Psych:  change in mood or affect.  depression or anxiety.   memory loss.  OBJ- Physical Exam General- Alert, Oriented, Affect-appropriate, Distress- none acute, WDWN Skin- rash-none, lesions- none, excoriation- none Lymphadenopathy- none Head- atraumatic            Eyes- Gross vision intact, PERRLA, conjunctivae and secretions clear            Ears- Hearing, canals-normal            Nose- Clear, no-Septal dev, mucus, polyps, erosion, perforation             Throat- Mallampati IV, mucosa  clear , drainage- none, tonsils+present, +teeth Neck- flexible , trachea midline, no stridor , thyroid nl, carotid no bruit Chest - symmetrical excursion , unlabored           Heart/CV- RRR , no murmur , no gallop  , no rub, nl s1 s2                           - JVD- none , edema- none, stasis changes- none, varices- none           Lung- clear to P&A, wheeze- none, cough- none , dullness-none, rub- none           Chest wall-  Abd-  Br/ Gen/ Rectal- Not done, not indicated Extrem- cyanosis- none, clubbing, none, atrophy- none, strength- nl Neuro- grossly intact to observation

## 2020-10-13 ENCOUNTER — Encounter: Payer: Self-pay | Admitting: Internal Medicine

## 2020-10-13 ENCOUNTER — Ambulatory Visit (INDEPENDENT_AMBULATORY_CARE_PROVIDER_SITE_OTHER): Payer: No Typology Code available for payment source | Admitting: Internal Medicine

## 2020-10-13 ENCOUNTER — Other Ambulatory Visit: Payer: Self-pay

## 2020-10-13 VITALS — BP 118/74 | HR 73 | Temp 97.6°F | Ht 62.0 in | Wt 148.0 lb

## 2020-10-13 DIAGNOSIS — J310 Chronic rhinitis: Secondary | ICD-10-CM | POA: Diagnosis not present

## 2020-10-13 DIAGNOSIS — R0981 Nasal congestion: Secondary | ICD-10-CM | POA: Diagnosis not present

## 2020-10-13 DIAGNOSIS — R0683 Snoring: Secondary | ICD-10-CM

## 2020-10-13 DIAGNOSIS — R400 Somnolence: Secondary | ICD-10-CM

## 2020-10-13 MED ORDER — MONTELUKAST SODIUM 10 MG PO TABS: 10.0000 mg | ORAL_TABLET | Freq: Every day | ORAL | 11 refills | Status: DC

## 2020-10-13 NOTE — Assessment & Plan Note (Signed)
Somnolence may reflect sedating effect of several meds, thyroid status, etc, but with snoring and long palate, we discussed evaluation for possible sleep apnea. Questions answered. Plan- sleep study

## 2020-10-13 NOTE — Patient Instructions (Signed)
Order- schedule ome sleep test     dx snoring  Please call us for results about 2 weeks after your sleep test- it will not be in My Chart  Script sent to try singulair 1 daily for your nose--    give it 2-3 weeks   Also suggest you try otc nasal spray Nasalcrom(cromolyn)- may need pharmacist to find this for you.   Please call if we can help

## 2020-10-13 NOTE — Assessment & Plan Note (Signed)
Worsening of nasal congestion and drainage since Covid, mainly at night. Suspect nonallergic/ vasomotor. Allergy onset like this would be less likely. Wee are going to avoid anticholinergics because of borderline glaucoma, (consider Astelin), but first try Nasalcrom and Singulair. Can refer to allergist if needed.

## 2020-10-25 NOTE — Progress Notes (Signed)
[name]: [phone] Subjective:   [name], am serving as a scribe for Dr. Hulan Saas.  This visit occurred during the SARS-CoV-2 public health emergency.  Safety protocols were in place, including screening questions prior to the visit, additional usage of staff PPE, and extensive cleaning of exam room while observing appropriate contact time as indicated for disinfecting solutions.    I'm seeing this patient by the request  of:  [name], DO  CC: right foot pain   OBS:Elizabeth Meyer  Elizabeth Meyer is a 60 y.o. female coming in with complaint of R, plantar fasciitis. Seen by Dr. [name] on 09/25/2020. Placed in cam walker. Pain over medial aspect of calcaneous. Has been icing that area. Patient states that she has had pain since April after exercising. Has been using cam walker intermittently when her pain increases. Using OOFOS in the house.     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   Degenerative arthritis of knee, bilateral 05/06/2019   Elevated LDL cholesterol level 06/17/2017   Endometrial polyp    Essential hypertension 01/29/2019   Heart murmur Dr Johnsie Cancel   dx 2012--  Benign--  per echo abnormal mitral inflow   History of gastrointestinal ulcer    Hyperparathyroidism, primary ([place]) 01/11/2018   IBS (irritable bowel syndrome)    Incomplete right bundle branch block 04/17/2020   Joint swelling 02/11/2019   Loss of transverse plantar arch 07/27/2014   Low back pain 09/09/2017   Lower extremity edema 02/11/2019   Nasal congestion 04/17/2020   Nonallopathic lesion of cervical region 06/10/2018   Nonallopathic lesion of lumbosacral region 06/10/2018   Nonallopathic lesion of rib cage 06/10/2018   Nonallopathic lesion of sacral region  06/10/2018   Nonallopathic lesion of thoracic region 06/10/2018   Palpitations 04/17/2020   Patellofemoral syndrome of both knees 12/19/2016   Inj 10/30 bilateral Bilateral injection September 09, 2017 repeat injections in March 17, 2018 August 06, 2018 Durolane approved 01/15/2019.  Injected January 28, 2019   Pharyngitis 06/21/2014   Plantar fasciitis, right 07/27/2014   Post-menopausal 06/17/2017   Seasonal allergies    Sinusitis, acute frontal 06/03/2014   Suprapubic pain 05/30/2017   Wears contact lenses    Past Surgical History:  Procedure Laterality Date   CARPAL TUNNEL RELEASE Left 05/15/2017   Procedure: LEFT CARPAL TUNNEL RELEASE;  Surgeon: [name], MD;  Location: [facility];  Service: Orthopedics;  Laterality: Left;   COLONOSCOPY     COLONOSCOPY WITH PROPOFOL  last one 04-28-2014   CYSTOSCOPY N/A 06/06/2014   Procedure: CYSTOSCOPY;  Surgeon: [name], MD;  Location: [facility];  Service: Gynecology;  Laterality: N/A;   DILATATION & CURRETTAGE/HYSTEROSCOPY WITH RESECTOCOPE N/A 06/06/2014   Procedure: DILATATION & CURETTAGE/HYSTEROSCOPY WITH RESECTOCOPE;  Surgeon: [name], MD;  Location: [place];  Service: Gynecology;  Laterality: N/A;   LAPAROSCOPIC SALPINGO OOPHERECTOMY Left 06/06/2014   Procedure: LAPAROSCOPIC LEFT SALPINGO OOPHORECTOMY;  Surgeon: [name], MD;  Location: [place];  Service: Gynecology;  Laterality: Left;   PARATHYROIDECTOMY Right 01/13/2018   Procedure: RIGHT SUPERIOR PARATHYROIDECTOMY;  Surgeon: Armandina Gemma, MD;  Location: [facility];  Service: General;  Laterality: Right;   PLANTAR FASCIA SURGERY  Left 2009   topaz procedure   TRANSTHORACIC ECHOCARDIOGRAM  08-12-2007   normal LV,  ef 65-70%,  abnormal mitral inflow with trivial MR,  trivial PR and TR   Social History   Socioeconomic History   Marital status: Married    Spouse name: Not on file   Number of children: 2   Years of  education: Not on file   Highest education level: Not on file  Occupational History   Occupation: preschool teacher    Employer: [employer]  Tobacco Use   Smoking status: Never   Smokeless tobacco: Never  Vaping Use   Vaping Use: Never used  Substance and Sexual Activity   Alcohol use: Yes    Comment: occasional   Drug use: Never   Sexual activity: Not on file  Other Topics Concern   Not on file  Social History Narrative   Married, Aeronautical engineer 2 daughters   1-2 caffeinated beverages/day   Social Determinants of Health   Financial Resource Strain: Not on file  Food Insecurity: Not on file  Transportation Needs: Not on file  Physical Activity: Not on file  Stress: Not on file  Social Connections: Not on file   No Known Allergies Family History  Problem Relation Age of Onset   Glaucoma Mother    Hypertension Mother    Hyperlipidemia Mother    Heart disease Mother    Heart disease Paternal Grandfather    Tuberculosis Maternal Grandfather    Diabetes Maternal Grandfather    Dementia Paternal Grandmother    Tuberculosis Other    Arthritis-Osteo Father    Colon cancer Neg Hx    Stomach cancer Neg Hx    Esophageal cancer Neg Hx    Pancreatic cancer Neg Hx     Current Outpatient Medications (Endocrine & Metabolic):    predniSONE (DELTASONE) 20 MG tablet, Take 2 tablets (40 mg total) by mouth daily with breakfast.  Current Outpatient Medications (Cardiovascular):    lisinopril-hydrochlorothiazide (ZESTORETIC) 10-12.5 MG tablet, TAKE 1 TABLET BY MOUTH EVERY DAY  Current Outpatient Medications (Respiratory):    cetirizine (ZYRTEC) 10 MG tablet, TAKE 1 TABLET BY MOUTH EVERYDAY AT BEDTIME   montelukast (SINGULAIR) 10 MG tablet, Take 1 tablet (10 mg total) by mouth at bedtime.  Current Outpatient Medications (Analgesics):    naproxen sodium (ALEVE) 220 MG tablet, Take 220 mg by mouth daily as needed (Joint pain).   Current Outpatient Medications (Other):     famotidine (PEPCID) 20 MG tablet, Take 1 tablet (20 mg total) by mouth daily.   gabapentin (NEURONTIN) 100 MG capsule, TAKE 2 CAPSULES (200 MG TOTAL) BY MOUTH AT BEDTIME.   pantoprazole (PROTONIX) 20 MG tablet, Take 20 mg by mouth daily.   potassium chloride (KLOR-CON) 10 MEQ tablet, TAKE 2 TABLETS BY MOUTH EVERY DAY   traZODone (DESYREL) 100 MG tablet, TAKE 1 TABLET BY MOUTH AT BEDTIME AS NEEDED FOR SLEEP.   Vitamin D, Ergocalciferol, (DRISDOL) 1.25 MG (50000 UNIT) CAPS capsule, Take 1 capsule 1x a week   Reviewed prior external information including notes and imaging from  primary care provider As well as notes that were available from care everywhere and other healthcare systems.  Past medical history, social, surgical and family history all reviewed in electronic medical record.  No pertanent information unless stated regarding to the chief complaint.   Review of Systems:  No headache, visual changes, nausea, vomiting, diarrhea, constipation, dizziness, abdominal pain, skin rash, fevers, chills, night sweats, weight loss, swollen lymph nodes,  body aches, joint swelling, chest pain, shortness of breath, mood changes. POSITIVE muscle aches  Objective  Blood pressure 138/90, pulse 92, height 5\' 2"  (1.575 m), weight 148 lb (67.1 kg), last menstrual period 03/30/2016, SpO2 98 %.   General: No apparent distress alert and oriented x3 mood and affect normal, dressed appropriately.  HEENT: Pupils equal, extraocular movements intact  Respiratory: Patient's speak in full sentences and does not appear short of breath  Cardiovascular: No lower extremity edema, non tender, no erythema  Gait normal with good balance and coordination.  MSK:  Rectal exam has some tenderness to palpation more over the medial calcaneal area.  Patient does have maybe some mild swelling noted of the fat pad.  Minimal tenderness noted over the plantar fascia on the plantar aspect.  Patient does have very mild tightness noted  of the posterior cord.  Neurovascularly intact distally.  Limited musculoskeletal ultrasound was performed and interpreted by Lyndal Pulley  Limited ultrasound of patient's right plantar fascial did not show significant enlargement.  Does have significant hypoechoic changes are noted of the fat pad.  No significant abnormality noted to the posterior tibialis.  Patient's ankle mortise appears to be unremarkable. Impression: fat pad irritation     Impression and Recommendations:     The above documentation has been reviewed and is accurate and complete Lyndal Pulley, DO

## 2020-10-30 ENCOUNTER — Other Ambulatory Visit: Payer: Self-pay

## 2020-10-30 ENCOUNTER — Encounter: Payer: Self-pay | Admitting: Family Medicine

## 2020-10-30 ENCOUNTER — Ambulatory Visit (INDEPENDENT_AMBULATORY_CARE_PROVIDER_SITE_OTHER): Payer: No Typology Code available for payment source | Admitting: Family Medicine

## 2020-10-30 ENCOUNTER — Ambulatory Visit (INDEPENDENT_AMBULATORY_CARE_PROVIDER_SITE_OTHER): Payer: No Typology Code available for payment source

## 2020-10-30 ENCOUNTER — Ambulatory Visit: Payer: Self-pay

## 2020-10-30 VITALS — BP 138/90 | HR 92 | Ht 62.0 in | Wt 148.0 lb

## 2020-10-30 DIAGNOSIS — G8929 Other chronic pain: Secondary | ICD-10-CM

## 2020-10-30 DIAGNOSIS — M79671 Pain in right foot: Secondary | ICD-10-CM

## 2020-10-30 MED ORDER — PREDNISONE 20 MG PO TABS: 40.0000 mg | ORAL_TABLET | Freq: Every day | ORAL | 0 refills | Status: DC

## 2020-10-30 NOTE — Assessment & Plan Note (Signed)
Patient has what appears to be a fat pad irritation.  Discussed icing regimen and home exercises, which we can avoid.  Patient will do more but he will cup.  Discussed avoiding being barefoot.  X-rays pending.  Follow-up again in 4 to 6 weeks

## 2020-10-30 NOTE — Patient Instructions (Signed)
Fat pad contusion Xray today Prednisone '40mg'$  daily for 5 days After prednisone start '600mg'$  3x a day for 3 days Can continue heel cup  Transfer back into shoes OOFOS in the house HOKA Arahi  Send message in 1 week Have follow up in 4 weeks

## 2020-11-08 ENCOUNTER — Encounter: Payer: Self-pay | Admitting: Internal Medicine

## 2020-11-08 ENCOUNTER — Encounter: Payer: Self-pay | Admitting: Family Medicine

## 2020-11-08 DIAGNOSIS — M25571 Pain in right ankle and joints of right foot: Secondary | ICD-10-CM

## 2020-11-08 DIAGNOSIS — M79671 Pain in right foot: Secondary | ICD-10-CM

## 2020-11-12 ENCOUNTER — Other Ambulatory Visit: Payer: Self-pay | Admitting: Family Medicine

## 2020-11-14 ENCOUNTER — Other Ambulatory Visit: Payer: Self-pay | Admitting: Family Medicine

## 2020-11-14 DIAGNOSIS — E039 Hypothyroidism, unspecified: Secondary | ICD-10-CM

## 2020-11-20 ENCOUNTER — Encounter: Payer: Self-pay | Admitting: Family Medicine

## 2020-11-22 MED ORDER — CLOTRIMAZOLE-BETAMETHASONE 1-0.05 % EX CREA: 1.0000 "application " | TOPICAL_CREAM | Freq: Two times a day (BID) | CUTANEOUS | 0 refills | Status: AC

## 2020-11-26 ENCOUNTER — Ambulatory Visit (INDEPENDENT_AMBULATORY_CARE_PROVIDER_SITE_OTHER): Payer: No Typology Code available for payment source

## 2020-11-26 ENCOUNTER — Other Ambulatory Visit: Payer: Self-pay

## 2020-11-26 DIAGNOSIS — M79671 Pain in right foot: Secondary | ICD-10-CM | POA: Diagnosis not present

## 2020-11-26 DIAGNOSIS — M25571 Pain in right ankle and joints of right foot: Secondary | ICD-10-CM | POA: Diagnosis not present

## 2020-11-27 ENCOUNTER — Ambulatory Visit (INDEPENDENT_AMBULATORY_CARE_PROVIDER_SITE_OTHER): Payer: No Typology Code available for payment source | Admitting: Family Medicine

## 2020-11-27 ENCOUNTER — Ambulatory Visit: Payer: Self-pay

## 2020-11-27 ENCOUNTER — Encounter: Payer: Self-pay | Admitting: Family Medicine

## 2020-11-27 ENCOUNTER — Other Ambulatory Visit: Payer: Self-pay

## 2020-11-27 VITALS — BP 140/88 | HR 78 | Ht 62.0 in | Wt 146.0 lb

## 2020-11-27 DIAGNOSIS — M722 Plantar fascial fibromatosis: Secondary | ICD-10-CM | POA: Diagnosis not present

## 2020-11-27 DIAGNOSIS — M79671 Pain in right foot: Secondary | ICD-10-CM | POA: Diagnosis not present

## 2020-11-27 MED ORDER — NITROGLYCERIN 0.2 MG/HR TD PT24: MEDICATED_PATCH | TRANSDERMAL | 0 refills | Status: DC

## 2020-11-27 MED ORDER — MELOXICAM 15 MG PO TABS: 15.0000 mg | ORAL_TABLET | Freq: Every day | ORAL | 0 refills | Status: DC

## 2020-11-27 NOTE — Patient Instructions (Signed)
Cam walker size 7 Nitroglycerin Protocol   Apply 1/4 nitroglycerin patch to affected area daily.  Change position of patch within the affected area every 24 hours.  You may experience a headache during the first 1-2 weeks of using the patch, these should subside.  If you experience headaches after beginning nitroglycerin patch treatment, you may take your preferred over the counter pain reliever.  Another side effect of the nitroglycerin patch is skin irritation or rash related to patch adhesive.  Please notify our office if you develop more severe headaches or rash, and stop the patch.  Tendon healing with nitroglycerin patch may require 12 to 24 weeks depending on the extent of injury.  Men should not use if taking Viagra, Cialis, or Levitra.   Do not use if you have migraines or rosacea.  See me in 3 weeks, let's consider injection if not improved

## 2020-11-27 NOTE — Assessment & Plan Note (Signed)
Patient on ultrasound today does show that patient does have some potential tearing noted.  Discussed the possibility of injection.  Patient wants to hold on we will consider the possibility of also PRP.  Awaiting the final read of the MRIs but did not see significant tearing numbness or anything other abnormal except for potential stress reaction noted of the fourth metatarsal where patient has not had any significant pain.  Patient will be fitted in a cam walker at this point to try to unload the area.  May need to make patient's nonweightbearing.  Follow-up with me again 4 weeks otherwise.  Patient's goal is to be able to dance and walk at her daughter's marriage in October.

## 2020-11-27 NOTE — Progress Notes (Signed)
[place] Phone: [phone] Subjective:   [name], am serving as a scribe for Dr. Hulan Saas. This visit occurred during the SARS-CoV-2 public health emergency.  Safety protocols were in place, including screening questions prior to the visit, additional usage of staff PPE, and extensive cleaning of exam room while observing appropriate contact time as indicated for disinfecting solutions.   I'm seeing this patient by the request  of:  [name], DO  CC: Right foot pain follow-up  [id number]  10/30/2020 Patient has what appears to be a fat pad irritation.  Discussed icing regimen and home exercises, which we can avoid.  Patient will do more but he will cup.  Discussed avoiding being barefoot.  X-rays pending.  Follow-up again in 4 to 6 weeks  Update 11/27/2020 Elizabeth Meyer is a 60 y.o. female coming in with complaint of R foot pain. Patient states that she did have some relief for 2 days when on prednisone. Pain increased again after she finished the course. Had MRI's yesterday. Is here for results.  Patient's MRI was reviewed by me.  I do not see any true cortical irregularity and awaiting overread.  Patient may have some very mild hypoechoic changes and increasing dilation of the plantar fascia but did not see any acute tear.  Calcaneus appears to be unremarkable.      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   Degenerative arthritis of knee, bilateral 05/06/2019   Elevated LDL cholesterol level 06/17/2017   Endometrial polyp    Essential hypertension 01/29/2019   Heart murmur Dr Johnsie Cancel   dx 2012--  Benign--  per echo abnormal mitral inflow   History of gastrointestinal ulcer    Hyperparathyroidism, primary ([place]) 01/11/2018    IBS (irritable bowel syndrome)    Incomplete right bundle branch block 04/17/2020   Joint swelling 02/11/2019   Loss of transverse plantar arch 07/27/2014   Low back pain 09/09/2017   Lower extremity edema 02/11/2019   Nasal congestion 04/17/2020   Nonallopathic lesion of cervical region 06/10/2018   Nonallopathic lesion of lumbosacral region 06/10/2018   Nonallopathic lesion of rib cage 06/10/2018   Nonallopathic lesion of sacral region 06/10/2018   Nonallopathic lesion of thoracic region 06/10/2018   Palpitations 04/17/2020   Patellofemoral syndrome of both knees 12/19/2016   Inj 10/30 bilateral Bilateral injection September 09, 2017 repeat injections in March 17, 2018 August 06, 2018 Durolane approved 01/15/2019.  Injected January 28, 2019   Pharyngitis 06/21/2014   Plantar fasciitis, right 07/27/2014   Post-menopausal 06/17/2017   Seasonal allergies    Sinusitis, acute frontal 06/03/2014   Suprapubic pain 05/30/2017   Wears contact lenses    Past Surgical History:  Procedure Laterality Date   CARPAL TUNNEL RELEASE Left 05/15/2017   Procedure: LEFT CARPAL TUNNEL RELEASE;  Surgeon: [name], MD;  Location: [place];  Service: Orthopedics;  Laterality: Left;   COLONOSCOPY     COLONOSCOPY WITH PROPOFOL  last one 04-28-2014   CYSTOSCOPY N/A 06/06/2014   Procedure: CYSTOSCOPY;  Surgeon: [name], MD;  Location: [facility];  Service: Gynecology;  Laterality: N/A;   DILATATION & CURRETTAGE/HYSTEROSCOPY WITH RESECTOCOPE N/A 06/06/2014   Procedure: DILATATION & CURETTAGE/HYSTEROSCOPY WITH RESECTOCOPE;  Surgeon:  [name], MD;  Location: [facility];  Service: Gynecology;  Laterality: N/A;   LAPAROSCOPIC SALPINGO OOPHERECTOMY Left 06/06/2014   Procedure: LAPAROSCOPIC LEFT SALPINGO OOPHORECTOMY;  Surgeon: [name], MD;  Location: [place];  Service: Gynecology;  Laterality: Left;   PARATHYROIDECTOMY Right 01/13/2018   Procedure: RIGHT  SUPERIOR PARATHYROIDECTOMY;  Surgeon: Armandina Gemma, MD;  Location: [facility];  Service: General;  Laterality: Right;   PLANTAR FASCIA SURGERY Left 2009   topaz procedure   TRANSTHORACIC ECHOCARDIOGRAM  08-12-2007   normal LV,  ef 65-70%,  abnormal mitral inflow with trivial MR,  trivial PR and TR   Social History   Socioeconomic History   Marital status: Married    Spouse name: Not on file   Number of children: 2   Years of education: Not on file   Highest education level: Not on file  Occupational History   Occupation: preschool teacher    Employer: [employer]  Tobacco Use   Smoking status: Never   Smokeless tobacco: Never  Vaping Use   Vaping Use: Never used  Substance and Sexual Activity   Alcohol use: Yes    Comment: occasional   Drug use: Never   Sexual activity: Not on file  Other Topics Concern   Not on file  Social History Narrative   Married, Aeronautical engineer 2 daughters   1-2 caffeinated beverages/day   Social Determinants of Health   Financial Resource Strain: Not on file  Food Insecurity: Not on file  Transportation Needs: Not on file  Physical Activity: Not on file  Stress: Not on file  Social Connections: Not on file   No Known Allergies Family History  Problem Relation Age of Onset   Glaucoma Mother    Hypertension Mother    Hyperlipidemia Mother    Heart disease Mother    Heart disease Paternal Grandfather    Tuberculosis Maternal Grandfather    Diabetes Maternal Grandfather    Dementia Paternal Grandmother    Tuberculosis Other    Arthritis-Osteo Father    Colon cancer Neg Hx    Stomach cancer Neg Hx    Esophageal cancer Neg Hx    Pancreatic cancer Neg Hx     Current Outpatient Medications (Endocrine & Metabolic):    predniSONE (DELTASONE) 20 MG tablet, Take 2 tablets (40 mg total) by mouth daily with breakfast.  Current Outpatient Medications (Cardiovascular):    nitroGLYCERIN (NITRO-DUR) 0.2 mg/hr patch, Apply 1/4 of a patch to skin  once daily.   lisinopril-hydrochlorothiazide (ZESTORETIC) 10-12.5 MG tablet, TAKE 1 TABLET BY MOUTH EVERY DAY  Current Outpatient Medications (Respiratory):    cetirizine (ZYRTEC) 10 MG tablet, TAKE 1 TABLET BY MOUTH EVERYDAY AT BEDTIME   montelukast (SINGULAIR) 10 MG tablet, Take 1 tablet (10 mg total) by mouth at bedtime.  Current Outpatient Medications (Analgesics):    meloxicam (MOBIC) 15 MG tablet, Take 1 tablet (15 mg total) by mouth daily.   naproxen sodium (ALEVE) 220 MG tablet, Take 220 mg by mouth daily as needed (Joint pain).   Current Outpatient Medications (Other):    clotrimazole-betamethasone (LOTRISONE) cream, Apply 1 application topically 2 (two) times daily. For 2 weeks   famotidine (PEPCID) 20 MG tablet, Take 1 tablet (20 mg total) by mouth daily.   gabapentin (NEURONTIN) 100 MG capsule, TAKE 2 CAPSULES (200 MG TOTAL) BY MOUTH AT BEDTIME.   pantoprazole (PROTONIX) 20 MG tablet, Take 20 mg by mouth daily.   potassium chloride (KLOR-CON) 10 MEQ  tablet, TAKE 2 TABLETS BY MOUTH EVERY DAY   traZODone (DESYREL) 100 MG tablet, TAKE 1 TABLET BY MOUTH AT BEDTIME AS NEEDED FOR SLEEP.   Vitamin D, Ergocalciferol, (DRISDOL) 1.25 MG (50000 UNIT) CAPS capsule, Take 1 capsule 1x a week   Reviewed prior external information including notes and imaging from  primary care provider As well as notes that were available from care everywhere and other healthcare systems.  Past medical history, social, surgical and family history all reviewed in electronic medical record.  No pertanent information unless stated regarding to the chief complaint.   Review of Systems:  No headache, visual changes, nausea, vomiting, diarrhea, constipation, dizziness, abdominal pain, skin rash, fevers, chills, night sweats, weight loss, swollen lymph nodes, body aches, joint swelling, chest pain, shortness of breath, mood changes. POSITIVE muscle aches  Objective  Blood pressure 140/88, pulse 78, height '5\' 2"'$   (1.575 m), weight 146 lb (66.2 kg), last menstrual period 03/30/2016, SpO2 98 %.   General: No apparent distress alert and oriented x3 mood and affect normal, dressed appropriately.  HEENT: Pupils equal, extraocular movements intact  Respiratory: Patient's speak in full sentences and does not appear short of breath  Cardiovascular: No lower extremity edema, non tender, no erythema  Gait severely antalgic Right foot exam shows severe tenderness over the plantar aspect of the foot near the calcaneal area.  Ankle exam has fairly good range of motion.  Negative Tinel's.  Mild tightness of the posterior cord.  Limited muscular skeletal ultrasound was performed and interpreted by Hulan Saas, M  Limited musculoskeletal of patient's foot shows some increase in hypoechoic changes that is most consistent with the possibility of a tear noted.  Increasing neovascularization in Doppler flow in the surrounding area. Impression: Questionable tearing of the plantar fascia    Impression and Recommendations:     The above documentation has been reviewed and is accurate and complete Lyndal Pulley, DO

## 2020-11-28 ENCOUNTER — Encounter: Payer: Self-pay | Admitting: Family Medicine

## 2020-11-29 ENCOUNTER — Ambulatory Visit: Payer: No Typology Code available for payment source

## 2020-11-29 ENCOUNTER — Other Ambulatory Visit: Payer: Self-pay

## 2020-11-29 DIAGNOSIS — R0683 Snoring: Secondary | ICD-10-CM | POA: Diagnosis not present

## 2020-12-01 DIAGNOSIS — R0683 Snoring: Secondary | ICD-10-CM

## 2020-12-14 NOTE — Progress Notes (Signed)
Elizabeth Meyer Phone: [phone] Subjective:   [name], am serving as a scribe for Dr. Hulan Saas.  This visit occurred during the SARS-CoV-2 public health emergency.  Safety protocols were in place, including screening questions prior to the visit, additional usage of staff PPE, and extensive cleaning of exam room while observing appropriate contact time as indicated for disinfecting solutions.   I'm seeing this patient by the request  of:  Elizabeth Held, DO  CC: Right foot pain follow-up  [id number]  11/27/2020 Patient on ultrasound today does show that patient does have some potential tearing noted.  Discussed the possibility of injection.  Patient wants to hold on we will consider the possibility of also PRP.  Awaiting the final read of the MRIs but did not see significant tearing numbness or anything other abnormal except for potential stress reaction noted of the fourth metatarsal where patient has not had any significant pain.  Patient will be fitted in a cam walker at this point to try to unload the area.  May need to make patient's nonweightbearing.  Follow-up with me again 4 weeks otherwise.  Patient's goal is to be able to dance and walk at her daughter's marriage in October.  Updated 12/18/2020 Elizabeth Meyer is a 60 y.o. female coming in with complaint of R foot pain. Patient states that her pain is the same as last visit. Pain is less in the boot but pain is the same if she walks to bathroom at night.  MRI did show the patient did have thickening of the plantar fascia.  Ultrasound did show the patient did have what appeared to be some intrasubstance tearing noted as well.     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   Degenerative  arthritis of knee, bilateral 05/06/2019   Elevated LDL cholesterol level 06/17/2017   Endometrial polyp    Essential hypertension 01/29/2019   Heart murmur Dr Johnsie Cancel   dx 2012--  Benign--  per echo abnormal mitral inflow   History of gastrointestinal ulcer    Hyperparathyroidism, primary ([facility]) 01/11/2018   IBS (irritable bowel syndrome)    Incomplete right bundle branch block 04/17/2020   Joint swelling 02/11/2019   Loss of transverse plantar arch 07/27/2014   Low back pain 09/09/2017   Lower extremity edema 02/11/2019   Nasal congestion 04/17/2020   Nonallopathic lesion of cervical region 06/10/2018   Nonallopathic lesion of lumbosacral region 06/10/2018   Nonallopathic lesion of rib cage 06/10/2018   Nonallopathic lesion of sacral region 06/10/2018   Nonallopathic lesion of thoracic region 06/10/2018   Palpitations 04/17/2020   Patellofemoral syndrome of both knees 12/19/2016   Inj 10/30 bilateral Bilateral injection September 09, 2017 repeat injections in March 17, 2018 August 06, 2018 Durolane approved 01/15/2019.  Injected January 28, 2019   Pharyngitis 06/21/2014   Plantar fasciitis, right 07/27/2014   Post-menopausal 06/17/2017   Seasonal allergies    Sinusitis, acute frontal 06/03/2014   Suprapubic pain 05/30/2017   Wears contact lenses    Past Surgical History:  Procedure Laterality Date   CARPAL TUNNEL RELEASE Left 05/15/2017   Procedure: LEFT CARPAL TUNNEL RELEASE;  Surgeon: [name], MD;  Location: [facility];  Service: Orthopedics;  Laterality: Left;  COLONOSCOPY     COLONOSCOPY WITH PROPOFOL  last one 04-28-2014   CYSTOSCOPY N/A 06/06/2014   Procedure: CYSTOSCOPY;  Surgeon: [name], MD;  Location: [facility];  Service: Gynecology;  Laterality: N/A;   DILATATION & CURRETTAGE/HYSTEROSCOPY WITH RESECTOCOPE N/A 06/06/2014   Procedure: DILATATION & CURETTAGE/HYSTEROSCOPY WITH RESECTOCOPE;  Surgeon: [name], MD;  Location: [facility];   Service: Gynecology;  Laterality: N/A;   LAPAROSCOPIC SALPINGO OOPHERECTOMY Left 06/06/2014   Procedure: LAPAROSCOPIC LEFT SALPINGO OOPHORECTOMY;  Surgeon: [name], MD;  Location: [facility];  Service: Gynecology;  Laterality: Left;   PARATHYROIDECTOMY Right 01/13/2018   Procedure: RIGHT SUPERIOR PARATHYROIDECTOMY;  Surgeon: Armandina Gemma, MD;  Location: [facility];  Service: General;  Laterality: Right;   PLANTAR FASCIA SURGERY Left 2009   topaz procedure   TRANSTHORACIC ECHOCARDIOGRAM  08-12-2007   normal LV,  ef 65-70%,  abnormal mitral inflow with trivial MR,  trivial PR and TR   Social History   Socioeconomic History   Marital status: Married    Spouse name: Not on file   Number of children: 2   Years of education: Not on file   Highest education level: Not on file  Occupational History   Occupation: preschool teacher    Employer: [employer]  Tobacco Use   Smoking status: Never   Smokeless tobacco: Never  Vaping Use   Vaping Use: Never used  Substance and Sexual Activity   Alcohol use: Yes    Comment: occasional   Drug use: Never   Sexual activity: Not on file  Other Topics Concern   Not on file  Social History Narrative   Married, Aeronautical engineer 2 daughters   1-2 caffeinated beverages/day   Social Determinants of Health   Financial Resource Strain: Not on file  Food Insecurity: Not on file  Transportation Needs: Not on file  Physical Activity: Not on file  Stress: Not on file  Social Connections: Not on file   No Known Allergies Family History  Problem Relation Age of Onset   Glaucoma Mother    Hypertension Mother    Hyperlipidemia Mother    Heart disease Mother    Heart disease Paternal Grandfather    Tuberculosis Maternal Grandfather    Diabetes Maternal Grandfather    Dementia Paternal Grandmother    Tuberculosis Other    Arthritis-Osteo Father    Colon cancer Neg Hx    Stomach cancer Neg Hx    Esophageal cancer Neg Hx     Pancreatic cancer Neg Hx     Current Outpatient Medications (Endocrine & Metabolic):    predniSONE (DELTASONE) 20 MG tablet, Take 2 tablets (40 mg total) by mouth daily with breakfast.  Current Outpatient Medications (Cardiovascular):    lisinopril-hydrochlorothiazide (ZESTORETIC) 10-12.5 MG tablet, TAKE 1 TABLET BY MOUTH EVERY DAY   nitroGLYCERIN (NITRO-DUR) 0.2 mg/hr patch, Apply 1/4 of a patch to skin once daily.  Current Outpatient Medications (Respiratory):    cetirizine (ZYRTEC) 10 MG tablet, TAKE 1 TABLET BY MOUTH EVERYDAY AT BEDTIME   montelukast (SINGULAIR) 10 MG tablet, Take 1 tablet (10 mg total) by mouth at bedtime.  Current Outpatient Medications (Analgesics):    meloxicam (MOBIC) 15 MG tablet, Take 1 tablet (15 mg total) by mouth daily.   naproxen sodium (ALEVE) 220 MG tablet, Take 220 mg by mouth daily as needed (Joint pain).   Current Outpatient Medications (Other):    clotrimazole-betamethasone (LOTRISONE) cream, Apply 1 application topically 2 (two)  times daily. For 2 weeks   famotidine (PEPCID) 20 MG tablet, Take 1 tablet (20 mg total) by mouth daily.   gabapentin (NEURONTIN) 100 MG capsule, TAKE 2 CAPSULES (200 MG TOTAL) BY MOUTH AT BEDTIME.   pantoprazole (PROTONIX) 20 MG tablet, Take 20 mg by mouth daily.   potassium chloride (KLOR-CON) 10 MEQ tablet, TAKE 2 TABLETS BY MOUTH EVERY DAY   traZODone (DESYREL) 100 MG tablet, TAKE 1 TABLET BY MOUTH AT BEDTIME AS NEEDED FOR SLEEP.   Vitamin D, Ergocalciferol, (DRISDOL) 1.25 MG (50000 UNIT) CAPS capsule, Take 1 capsule 1x a week   Reviewed prior external information including notes and imaging from  primary care provider As well as notes that were available from care everywhere and other healthcare systems.  Past medical history, social, surgical and family history all reviewed in electronic medical record.  No pertanent information unless stated regarding to the chief complaint.   Review of Systems:  No headache,  visual changes, nausea, vomiting, diarrhea, constipation, dizziness, abdominal pain, skin rash, fevers, chills, night sweats, weight loss, swollen lymph nodes, body aches, joint swelling, chest pain, shortness of breath, mood changes. POSITIVE muscle aches  Objective  Blood pressure 138/90, pulse 78, height '5\' 2"'$  (1.575 m), weight 147 lb (66.7 kg), last menstrual period 03/30/2016, SpO2 98 %.   General: No apparent distress alert and oriented x3 mood and affect normal, dressed appropriately.  HEENT: Pupils equal, extraocular movements intact  Respiratory: Patient's speak in full sentences and does not appear short of breath  Cardiovascular: No lower extremity edema, non tender, no erythema  Gait normal with good balance and coordination.  MSK: Patient's foot exam still tender to palpation over the medial calcaneal area and on the plantar aspect of the calcaneal area.  Patient is full range of motion of the ankle noted.  Limited muscular skeletal ultrasound was performed and interpreted by Hulan Saas, M  Limited musculoskeletal ultrasound of patient's right still shows the hypoechoic changes noted but significantly less than previous exam of the plantar fascia.  Still has thickening noted.   Procedure: Real-time Ultrasound Guided Injection of right plantar fascia Device: GE Logiq Q7 Ultrasound guided injection is preferred based studies that show increased duration, increased effect, greater accuracy, decreased procedural pain, increased response rate, and decreased cost with ultrasound guided versus blind injection.  Verbal informed consent obtained.  Time-out conducted.  Noted no overlying erythema, induration, or other signs of local infection.  Skin prepped in a sterile fashion.  Local anesthesia: Topical Ethyl chloride.  With sterile technique and under real time ultrasound guidance: With a 25-gauge half inch needle injected with 0.5 cc of 0.5% Marcaine and 0.5 cc of Kenalog 40  mg/mL. Completed without difficulty  Pain immediately resolved suggesting accurate placement of the medication.  Advised to call if fevers/chills, erythema, induration, drainage, or persistent bleeding.  Impression: Technically successful ultrasound guided injection.    Impression and Recommendations:     The above documentation has been reviewed and is accurate and complete Lyndal Pulley, DO

## 2020-12-15 ENCOUNTER — Telehealth: Payer: Self-pay | Admitting: Internal Medicine

## 2020-12-15 DIAGNOSIS — J3089 Other allergic rhinitis: Secondary | ICD-10-CM

## 2020-12-15 NOTE — Telephone Encounter (Signed)
Sleep study showed very mild sleep apnea, averaging 5-6 apneas/ hour. We can try CPAP if she would like, to see if that stops the snoring and improves tiredness     If she wants to try: order new DME, neww CPAP auto 5-15, mask of choice, humidifier, supplies, AirView/card     If she starts CPAP, needs ROV in 31-90 days after getting her machine.  Otherwise focus on managing stuffy nose- consider seeing an allergist. Keep weight down and try to sleep off flat of back.

## 2020-12-15 NOTE — Telephone Encounter (Signed)
I called and spoke with Elizabeth Meyer and she is aware of results per CY.   She stated that she is going to think on and do some research on the cpap information.  She will contact us when she decides if she wants to try it.    CY she would like the referral for the allergist.  She wanted to know who you recommend for this and will you place the referral? Thanks

## 2020-12-15 NOTE — Telephone Encounter (Signed)
Per Dr. Janee Morn AVS notes to call back 2 weeks after HST, patient calling for results.    Sending to Dr. Annamaria Boots for further recommendations.

## 2020-12-15 NOTE — Telephone Encounter (Signed)
Suggest referral to [facility] on [address] ([facility]) for dx perennial rhinitis

## 2020-12-18 ENCOUNTER — Ambulatory Visit: Payer: Self-pay

## 2020-12-18 ENCOUNTER — Other Ambulatory Visit: Payer: Self-pay

## 2020-12-18 ENCOUNTER — Ambulatory Visit (INDEPENDENT_AMBULATORY_CARE_PROVIDER_SITE_OTHER): Payer: No Typology Code available for payment source | Admitting: Family Medicine

## 2020-12-18 ENCOUNTER — Encounter: Payer: Self-pay | Admitting: Family Medicine

## 2020-12-18 VITALS — BP 138/90 | HR 78 | Ht 62.0 in | Wt 147.0 lb

## 2020-12-18 DIAGNOSIS — M722 Plantar fascial fibromatosis: Secondary | ICD-10-CM

## 2020-12-18 DIAGNOSIS — M79671 Pain in right foot: Secondary | ICD-10-CM | POA: Diagnosis not present

## 2020-12-18 NOTE — Telephone Encounter (Signed)
Called and spoke with pt letting her know recs stated by CY and she verbalized understanding. Referral to Asthma and Allergy has been placed for pt. Nothing further needed.

## 2020-12-18 NOTE — Patient Instructions (Signed)
Great to see you  Sorry for running lat e Elizabeth Meyer is your friend Injected the foot today  Wear boot out of house but ok to do OOFOS in the house.  See me again in 4-6 weeks and if not perfect lets do the PRP

## 2020-12-18 NOTE — Assessment & Plan Note (Signed)
Right foot pain does not likely more than plantar fasciitis and is noted.  Given injection today.  He tolerated the procedure well.  Could be a candidate for potential PRP.  Patient does have a wedding to be able to be part of.  If worsening pain will consider prednisone.  Follow-up with me again 6 to 8 weeks

## 2020-12-19 ENCOUNTER — Ambulatory Visit: Payer: No Typology Code available for payment source | Admitting: Family Medicine

## 2020-12-25 ENCOUNTER — Other Ambulatory Visit: Payer: Self-pay | Admitting: Family Medicine

## 2021-01-02 ENCOUNTER — Telehealth: Payer: Self-pay | Admitting: Internal Medicine

## 2021-01-02 ENCOUNTER — Encounter: Payer: Self-pay | Admitting: Family Medicine

## 2021-01-02 NOTE — Telephone Encounter (Signed)
Disregard.  error

## 2021-01-03 MED ORDER — PREDNISONE 50 MG PO TABS
50.0000 mg | ORAL_TABLET | Freq: Every day | ORAL | 0 refills | Status: DC
Start: 2021-01-03 — End: 2021-01-17

## 2021-01-16 NOTE — Progress Notes (Signed)
10/13/20- 59 yoF never smoker for sleep evaluation courtesy of Dr Carollee Herter with concern of Daytime Somnolence and Snoring Medical problem list includes  HTN, IBS, Hypothyroid, Hyperparathyroid, Seasonal Allergy, Covid infection August 2021,  Epworth score-1 Body weight today-148 lbs Covid vax-3 Moderna -----Sleep apnea, snoring for 1 year, more allergy symptoms since covid august 2021 Meds include Zyrtec, trazodone 100, Husband has commented on snoring over past year at least. She stays somewhat tired, not rested but without intrusive sleepiness. Takes several sedating meds.  Persistent nasal congestion mainly at night, with watery rhinorrhea worse since Covid. Has tried several antihistamines. Borderline glaucoma- advised to avoid steroid Flonase.  No hx ENT surgery, heart or lung disease. Saw ENT a year ago with no suggestions.  Mother used CPAP.  01/17/21- 59 yoF never smoker followed for mild OSA, Somnolence, complicated by HTN, IBS, Hypothyroid, Hyperparathyroid, Seasonal Allergy, Covid infection August 2021, HST 11/29/20- AHI 5.8/ hr, desaturation to 73%, body weight 148 lbs. We referred to Allergy/ Asthma for perennial rhinitis-  pending Covid vax-3 Moderna Flu vax- none  Body weight today-144 lbs Primary concerns were daytime tiredness and loud snoring. Discussed management of mild OSA, which is directed by symptoms and co-morbidities. She is choosing to try CPAP, understanding that  ENT evaluation and Oral appliance referral can be done if appropriate. She will keep allergy appt and try some otc approaches including nasal strips and limited usee of Afrin for effect.  ROS-see HPI   + = positive Constitutional:    weight loss, night sweats, fevers, chills, fatigue, lassitude. HEENT:    +headaches, difficulty swallowing, tooth/dental problems, sore throat,       +sneezing, itching, ear ache, +nasal congestion, post nasal drip, snoring CV:    chest pain, orthopnea, PND, swelling in  lower extremities, anasarca,                                   dizziness, palpitations Resp:   shortness of breath with exertion or at rest.                productive cough,   non-productive cough, coughing up of blood.              change in color of mucus.  wheezing.   Skin:    rash or lesions. GI:  +heartburn, indigestion, abdominal pain, nausea, vomiting, diarrhea,                 change in bowel habits, loss of appetite GU: dysuria, change in color of urine, no urgency or frequency.   flank pain. MS:   joint pain, stiffness, decreased range of motion, back pain. Neuro-     nothing unusual Psych:  change in mood or affect.  depression or anxiety.   memory loss.  OBJ- Physical Exam General- Alert, Oriented, Affect-appropriate, Distress- none acute, WDWN Skin- rash-none, lesions- none, excoriation- none Lymphadenopathy- none Head- atraumatic            Eyes- Gross vision intact, PERRLA, conjunctivae and secretions clear            Ears- Hearing, canals-normal            Nose- +turbinate edema, no-Septal dev, mucus, no polyps seen , no-erosion, perforation             Throat- Mallampati IV, mucosa clear , drainage- none, tonsils+present, +teeth Neck- flexible , trachea midline, no stridor ,  thyroid nl, carotid no bruit Chest - symmetrical excursion , unlabored           Heart/CV- RRR , no murmur , no gallop  , no rub, nl s1 s2                           - JVD- none , edema- none, stasis changes- none, varices- none           Lung- clear to P&A, wheeze- none, cough- none , dullness-none, rub- none           Chest wall-  Abd-  Br/ Gen/ Rectal- Not done, not indicated Extrem- cyanosis- none, clubbing, none, atrophy- none, strength- nl Neuro- grossly intact to observation

## 2021-01-17 ENCOUNTER — Ambulatory Visit (INDEPENDENT_AMBULATORY_CARE_PROVIDER_SITE_OTHER): Payer: No Typology Code available for payment source | Admitting: Internal Medicine

## 2021-01-17 ENCOUNTER — Other Ambulatory Visit: Payer: Self-pay

## 2021-01-17 ENCOUNTER — Encounter: Payer: Self-pay | Admitting: Internal Medicine

## 2021-01-17 VITALS — BP 110/70 | HR 64 | Temp 98.2°F | Ht 62.0 in | Wt 144.4 lb

## 2021-01-17 DIAGNOSIS — G4733 Obstructive sleep apnea (adult) (pediatric): Secondary | ICD-10-CM | POA: Diagnosis not present

## 2021-01-17 DIAGNOSIS — R0981 Nasal congestion: Secondary | ICD-10-CM

## 2021-01-17 DIAGNOSIS — R400 Somnolence: Secondary | ICD-10-CM | POA: Diagnosis not present

## 2021-01-17 NOTE — Assessment & Plan Note (Addendum)
Reviewed options. She wishes to try CPAP while she awaits Allergy eval. Conservative options including nasal decongestants, positional sleeping, nasal strips, otc oral appliances considered.  Plan- CPAP auto 5-15

## 2021-01-17 NOTE — Assessment & Plan Note (Signed)
Pending Allergy evaluation

## 2021-01-17 NOTE — Patient Instructions (Signed)
Order- new DME, new CPAP auto 5-15, mask of choice, humidifier, supplies, Airview/ card  Consider a trial of 1 puff of Afrin nasal spray in each nostril once daily at bedtime  Consider trial of Breathe Right nasal strips at bedtime  If you would like to see an ENT with interest in sleep apnea/ snoring, consider: Dr Melida Quitter or Dr Jerrell Belfast

## 2021-01-17 NOTE — Assessment & Plan Note (Signed)
Emphasized sleep hygiene, adequate sleep, potential side effects of meds. We will see if Rx directed at OSA helps this persistent complaint

## 2021-01-17 NOTE — Progress Notes (Signed)
[facility] [place] Phone: [phone] Subjective:   [name], am serving as a scribe for Dr. Hulan Saas. This visit occurred during the SARS-CoV-2 public health emergency.  Safety protocols were in place, including screening questions prior to the visit, additional usage of staff PPE, and extensive cleaning of exam room while observing appropriate contact time as indicated for disinfecting solutions.   I'm seeing this patient by the request  of:  [name], DO  CC: Follow-up for right foot pain  [id number]  12/18/2020 Right foot pain does not likely more than plantar fasciitis and is noted.  Given injection today.  He tolerated the procedure well.  Could be a candidate for potential PRP.  Patient does have a wedding to be able to be part of.  If worsening pain will consider prednisone.  Follow-up with me again 6 to 8 weeks  Updated 01/18/2021 Elizabeth Meyer is a 60 y.o. female coming in with complaint of right foot pain. Pain increased over weekend due to daughters wedding. Pain is back to where she was prior to the increase in her pain. Does feel like pain has improved slightly. Does not feel that injection caused marked improvement. Took prednisone week prior to wedding. States that there is swelling and it's very tender today.       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   Degenerative arthritis of knee, bilateral 05/06/2019   Elevated LDL cholesterol level 06/17/2017   Endometrial polyp    Essential hypertension 01/29/2019   Heart murmur Dr Johnsie Cancel   dx 2012--  Benign--  per echo abnormal mitral inflow   History of gastrointestinal ulcer    Hyperparathyroidism, primary ([place]) 01/11/2018   IBS (irritable bowel syndrome)    Incomplete  right bundle branch block 04/17/2020   Joint swelling 02/11/2019   Loss of transverse plantar arch 07/27/2014   Low back pain 09/09/2017   Lower extremity edema 02/11/2019   Nasal congestion 04/17/2020   Nonallopathic lesion of cervical region 06/10/2018   Nonallopathic lesion of lumbosacral region 06/10/2018   Nonallopathic lesion of rib cage 06/10/2018   Nonallopathic lesion of sacral region 06/10/2018   Nonallopathic lesion of thoracic region 06/10/2018   Palpitations 04/17/2020   Patellofemoral syndrome of both knees 12/19/2016   Inj 10/30 bilateral Bilateral injection September 09, 2017 repeat injections in March 17, 2018 August 06, 2018 Durolane approved 01/15/2019.  Injected January 28, 2019   Pharyngitis 06/21/2014   Plantar fasciitis, right 07/27/2014   Post-menopausal 06/17/2017   Seasonal allergies    Sinusitis, acute frontal 06/03/2014   Suprapubic pain 05/30/2017   Wears contact lenses    Past Surgical History:  Procedure Laterality Date   CARPAL TUNNEL RELEASE Left 05/15/2017   Procedure: LEFT CARPAL TUNNEL RELEASE;  Surgeon: [name], MD;  Location: [place];  Service: Orthopedics;  Laterality: Left;   COLONOSCOPY     COLONOSCOPY WITH PROPOFOL  last one 04-28-2014   CYSTOSCOPY N/A 06/06/2014   Procedure: CYSTOSCOPY;  Surgeon: [name], MD;  Location: [facility];  Service: Gynecology;  Laterality: N/A;   DILATATION & CURRETTAGE/HYSTEROSCOPY WITH RESECTOCOPE N/A 06/06/2014   Procedure: DILATATION & CURETTAGE/HYSTEROSCOPY WITH RESECTOCOPE;  Surgeon: [name], MD;  Location: [facility];  Service: Gynecology;  Laterality: N/A;   LAPAROSCOPIC SALPINGO OOPHERECTOMY Left 06/06/2014   Procedure: LAPAROSCOPIC LEFT SALPINGO OOPHORECTOMY;  Surgeon: [name], MD;  Location: [place];  Service: Gynecology;  Laterality: Left;   PARATHYROIDECTOMY Right 01/13/2018   Procedure: RIGHT SUPERIOR PARATHYROIDECTOMY;  Surgeon: Armandina Gemma,  MD;  Location: [facility];  Service: General;  Laterality: Right;   PLANTAR FASCIA SURGERY Left 2009   topaz procedure   TRANSTHORACIC ECHOCARDIOGRAM  08-12-2007   normal LV,  ef 65-70%,  abnormal mitral inflow with trivial MR,  trivial PR and TR   Social History   Socioeconomic History   Marital status: Married    Spouse name: Not on file   Number of children: 2   Years of education: Not on file   Highest education level: Not on file  Occupational History   Occupation: preschool teacher    Employer: [employer]  Tobacco Use   Smoking status: Never   Smokeless tobacco: Never  Vaping Use   Vaping Use: Never used  Substance and Sexual Activity   Alcohol use: Yes    Comment: occasional   Drug use: Never   Sexual activity: Not on file  Other Topics Concern   Not on file  Social History Narrative   Married, Aeronautical engineer 2 daughters   1-2 caffeinated beverages/day   Social Determinants of Health   Financial Resource Strain: Not on file  Food Insecurity: Not on file  Transportation Needs: Not on file  Physical Activity: Not on file  Stress: Not on file  Social Connections: Not on file   No Known Allergies Family History  Problem Relation Age of Onset   Glaucoma Mother    Hypertension Mother    Hyperlipidemia Mother    Heart disease Mother    Heart disease Paternal Grandfather    Tuberculosis Maternal Grandfather    Diabetes Maternal Grandfather    Dementia Paternal Grandmother    Tuberculosis Other    Arthritis-Osteo Father    Colon cancer Neg Hx    Stomach cancer Neg Hx    Esophageal cancer Neg Hx    Pancreatic cancer Neg Hx      Current Outpatient Medications (Cardiovascular):    lisinopril-hydrochlorothiazide (ZESTORETIC) 10-12.5 MG tablet, TAKE 1 TABLET BY MOUTH EVERY DAY   nitroGLYCERIN (NITRODUR - DOSED IN MG/24 HR) 0.2 mg/hr patch, APPLY 1/4 OF A PATCH TO SKIN ONCE DAILY.  Current Outpatient Medications (Respiratory):    cetirizine (ZYRTEC) 10 MG  tablet, TAKE 1 TABLET BY MOUTH EVERYDAY AT BEDTIME   montelukast (SINGULAIR) 10 MG tablet, Take 1 tablet (10 mg total) by mouth at bedtime.  Current Outpatient Medications (Analgesics):    meloxicam (MOBIC) 15 MG tablet, TAKE 1 TABLET (15 MG TOTAL) BY MOUTH DAILY.   naproxen sodium (ALEVE) 220 MG tablet, Take 220 mg by mouth daily as needed (Joint pain).   Current Outpatient Medications (Other):    clotrimazole-betamethasone (LOTRISONE) cream, Apply 1 application topically 2 (two) times daily. For 2 weeks   famotidine (PEPCID) 20 MG tablet, Take 1 tablet (20 mg total) by mouth daily.   gabapentin (NEURONTIN) 100 MG capsule, TAKE 2 CAPSULES (200 MG TOTAL) BY MOUTH AT BEDTIME.   potassium chloride (KLOR-CON) 10 MEQ tablet, TAKE 2 TABLETS BY MOUTH EVERY DAY   traZODone (DESYREL) 100 MG tablet, TAKE 1 TABLET BY MOUTH AT BEDTIME AS NEEDED FOR SLEEP.   Vitamin D, Ergocalciferol, (DRISDOL) 1.25 MG (50000 UNIT) CAPS capsule, Take 1 capsule  1x a week    Review of Systems:  No headache, visual changes, nausea, vomiting, diarrhea, constipation, dizziness, abdominal pain, skin rash, fevers, chills, night sweats, weight loss, swollen lymph nodes, body aches, joint swelling, chest pain, shortness of breath, mood changes. POSITIVE muscle aches  Objective  Blood pressure 124/80, pulse 70, height 5\' 2"  (1.575 m), weight 140 lb (63.5 kg), last menstrual period 03/30/2016, SpO2 97 %.   General: No apparent distress alert and oriented x3 mood and affect normal, dressed appropriately.  HEENT: Pupils equal, extraocular movements intact  Respiratory: Patient's speak in full sentences and does not appear short of breath  Cardiovascular: No lower extremity edema, non tender, no erythema  Gait normal with good balance and coordination.  MSK: Right foot exam still shows the patient does have tenderness to palpation over the calcaneal area.  No significant tenderness over the ankle mortise.  Good range of motion of  the ankle mortise. Limited muscular skeletal ultrasound was performed and interpreted by Hulan Saas, M  Limited ultrasound of patient's right calcaneal area shows the patient does have the inflammation noted of the plantar fascia with hypoechoic changes noted.  Patient does have what appears to be a intersubstance tear noted Impression: Questionable worsening of plantar fascial tear    Impression and Recommendations:     The above documentation has been reviewed and is accurate and complete Lyndal Pulley, DO

## 2021-01-18 ENCOUNTER — Ambulatory Visit: Payer: Self-pay

## 2021-01-18 ENCOUNTER — Encounter: Payer: Self-pay | Admitting: Family Medicine

## 2021-01-18 ENCOUNTER — Ambulatory Visit (INDEPENDENT_AMBULATORY_CARE_PROVIDER_SITE_OTHER): Payer: No Typology Code available for payment source | Admitting: Family Medicine

## 2021-01-18 VITALS — BP 124/80 | HR 70 | Ht 62.0 in | Wt 140.0 lb

## 2021-01-18 DIAGNOSIS — M722 Plantar fascial fibromatosis: Secondary | ICD-10-CM

## 2021-01-18 DIAGNOSIS — M79671 Pain in right foot: Secondary | ICD-10-CM

## 2021-01-18 NOTE — Patient Instructions (Signed)
PRP is a good idea Double book next week

## 2021-01-18 NOTE — Assessment & Plan Note (Signed)
Patient does have a plan fasciitis.  He was seen.  Noted very well on the ultrasound today.  Patient may have a little bit better than what it was previously.  At this point patient has failed all other conservative therapy.  Possibility of PRP.  Patient would like to try the PRP before any type of surgical intervention.  Patient scheduled in the near future.

## 2021-01-19 MED ORDER — FAMOTIDINE 40 MG PO TABS: 40.0000 mg | ORAL_TABLET | Freq: Every day | ORAL | 11 refills | Status: DC

## 2021-01-24 ENCOUNTER — Other Ambulatory Visit: Payer: Self-pay | Admitting: Family Medicine

## 2021-01-25 ENCOUNTER — Other Ambulatory Visit: Payer: Self-pay

## 2021-01-25 ENCOUNTER — Other Ambulatory Visit: Payer: Self-pay | Admitting: Family Medicine

## 2021-01-25 MED ORDER — MELOXICAM 15 MG PO TABS
15.0000 mg | ORAL_TABLET | Freq: Every day | ORAL | 0 refills | Status: DC
Start: 2021-01-25 — End: 2021-02-27

## 2021-02-05 NOTE — Progress Notes (Signed)
Elizabeth Meyer Elizabeth Meyer Phone: [phone] Subjective:    I'm seeing this patient by the request  of:  [name], DO  CC: foot pain   [id number]  01/18/2021 Patient does have a plan fasciitis.  He was seen.  Noted very well on the ultrasound today.  Patient may have a little bit better than what it was previously.  At this point patient has failed all other conservative therapy.  Possibility of PRP.  Patient would like to try the PRP before any type of surgical intervention.  Patient scheduled in the near future.  Updated 02/06/2021 Elizabeth Meyer is a 60 y.o. female coming in with complaint of right foot pain. PRP today. Continues to have difficulty.     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   Degenerative arthritis of knee, bilateral 05/06/2019   Elevated LDL cholesterol level 06/17/2017   Endometrial polyp    Essential hypertension 01/29/2019   Heart murmur Dr Johnsie Cancel   dx 2012--  Benign--  per echo abnormal mitral inflow   History of gastrointestinal ulcer    Hyperparathyroidism, primary ([place]) 01/11/2018   IBS (irritable bowel syndrome)    Incomplete right bundle branch block 04/17/2020   Joint swelling 02/11/2019   Loss of transverse plantar arch 07/27/2014   Low back pain 09/09/2017   Lower extremity edema 02/11/2019   Nasal congestion 04/17/2020   Nonallopathic lesion of cervical region 06/10/2018   Nonallopathic lesion of lumbosacral region 06/10/2018   Nonallopathic lesion of rib cage 06/10/2018   Nonallopathic lesion of sacral region 06/10/2018   Nonallopathic lesion of thoracic region 06/10/2018   Palpitations 04/17/2020   Patellofemoral syndrome of both knees 12/19/2016   Inj 10/30 bilateral Bilateral injection September 09, 2017 repeat injections in  March 17, 2018 August 06, 2018 Durolane approved 01/15/2019.  Injected January 28, 2019   Pharyngitis 06/21/2014   Plantar fasciitis, right 07/27/2014   Post-menopausal 06/17/2017   Seasonal allergies    Sinusitis, acute frontal 06/03/2014   Suprapubic pain 05/30/2017   Wears contact lenses    Past Surgical History:  Procedure Laterality Date   CARPAL TUNNEL RELEASE Left 05/15/2017   Procedure: LEFT CARPAL TUNNEL RELEASE;  Surgeon: [name], MD;  Location: [place];  Service: Orthopedics;  Laterality: Left;   COLONOSCOPY     COLONOSCOPY WITH PROPOFOL  last one 04-28-2014   CYSTOSCOPY N/A 06/06/2014   Procedure: CYSTOSCOPY;  Surgeon: [name], MD;  Location: [facility];  Service: Gynecology;  Laterality: N/A;   DILATATION & CURRETTAGE/HYSTEROSCOPY WITH RESECTOCOPE N/A 06/06/2014   Procedure: DILATATION & CURETTAGE/HYSTEROSCOPY WITH RESECTOCOPE;  Surgeon: [name], MD;  Location: [place];  Service: Gynecology;  Laterality: N/A;   LAPAROSCOPIC SALPINGO OOPHERECTOMY Left 06/06/2014   Procedure: LAPAROSCOPIC LEFT SALPINGO OOPHORECTOMY;  Surgeon: [name], MD;  Location: [place];  Service: Gynecology;  Laterality: Left;   PARATHYROIDECTOMY Right 01/13/2018   Procedure: RIGHT SUPERIOR PARATHYROIDECTOMY;  Surgeon: Armandina Gemma, MD;  Location: [facility];  Service: General;  Laterality: Right;   PLANTAR FASCIA SURGERY Left 2009   topaz procedure   TRANSTHORACIC ECHOCARDIOGRAM  08-12-2007   normal LV,  ef 65-70%,  abnormal mitral inflow with trivial MR,  trivial PR  and TR   Social History   Socioeconomic History   Marital status: Married    Spouse name: Not on file   Number of children: 2   Years of education: Not on file   Highest education level: Not on file  Occupational History   Occupation: preschool teacher    Employer: [employer]  Tobacco Use   Smoking status: Never   Smokeless tobacco: Never  Vaping Use    Vaping Use: Never used  Substance and Sexual Activity   Alcohol use: Yes    Comment: occasional   Drug use: Never   Sexual activity: Not on file  Other Topics Concern   Not on file  Social History Narrative   Married, Aeronautical engineer 2 daughters   1-2 caffeinated beverages/day   Social Determinants of Health   Financial Resource Strain: Not on file  Food Insecurity: Not on file  Transportation Needs: Not on file  Physical Activity: Not on file  Stress: Not on file  Social Connections: Not on file   No Known Allergies Family History  Problem Relation Age of Onset   Glaucoma Mother    Hypertension Mother    Hyperlipidemia Mother    Heart disease Mother    Heart disease Paternal Grandfather    Tuberculosis Maternal Grandfather    Diabetes Maternal Grandfather    Dementia Paternal Grandmother    Tuberculosis Other    Arthritis-Osteo Father    Colon cancer Neg Hx    Stomach cancer Neg Hx    Esophageal cancer Neg Hx    Pancreatic cancer Neg Hx      Current Outpatient Medications (Cardiovascular):    lisinopril-hydrochlorothiazide (ZESTORETIC) 10-12.5 MG tablet, TAKE 1 TABLET BY MOUTH EVERY DAY   nitroGLYCERIN (NITRODUR - DOSED IN MG/24 HR) 0.2 mg/hr patch, APPLY 1/4 OF A PATCH TO SKIN ONCE DAILY.  Current Outpatient Medications (Respiratory):    cetirizine (ZYRTEC) 10 MG tablet, TAKE 1 TABLET BY MOUTH EVERYDAY AT BEDTIME   montelukast (SINGULAIR) 10 MG tablet, Take 1 tablet (10 mg total) by mouth at bedtime.  Current Outpatient Medications (Analgesics):    meloxicam (MOBIC) 15 MG tablet, Take 1 tablet (15 mg total) by mouth daily.   naproxen sodium (ALEVE) 220 MG tablet, Take 220 mg by mouth daily as needed (Joint pain).   Current Outpatient Medications (Other):    doxycycline (VIBRA-TABS) 100 MG tablet, Take 1 tablet (100 mg total) by mouth 2 (two) times daily for 7 days.   clotrimazole-betamethasone (LOTRISONE) cream, Apply 1 application topically 2 (two)  times daily. For 2 weeks   famotidine (PEPCID) 40 MG tablet, Take 1 tablet (40 mg total) by mouth daily.   gabapentin (NEURONTIN) 100 MG capsule, TAKE 2 CAPSULES (200 MG TOTAL) BY MOUTH AT BEDTIME.   potassium chloride (KLOR-CON) 10 MEQ tablet, TAKE 2 TABLETS BY MOUTH EVERY DAY   traZODone (DESYREL) 100 MG tablet, TAKE 1 TABLET BY MOUTH EVERY DAY AT BEDTIME AS NEEDED FOR SLEEP   Vitamin D, Ergocalciferol, (DRISDOL) 1.25 MG (50000 UNIT) CAPS capsule, Take 1 capsule 1x a week     Review of Systems:  No headache, visual changes, nausea, vomiting, diarrhea, constipation, dizziness, abdominal pain, skin rash, fevers, chills, night sweats, weight loss, swollen lymph nodes, body aches, joint swelling, chest pain, shortness of breath, mood changes. POSITIVE muscle aches  Objective  Blood pressure 116/78, pulse 69, height 5\' 2"  (1.575 m), weight 145 lb (65.8 kg), last menstrual period 03/30/2016, SpO2 96 %.   General: No  apparent distress alert and oriented x3 mood and affect normal, dressed appropriately.  MSK:  Procedure: Real-time Ultrasound Guided Injection of right plantar fascial Device: GE Logiq Q7 Ultrasound guided injection is preferred based studies that show increased duration, increased effect, greater accuracy, decreased procedural pain, increased response rate, and decreased cost with ultrasound guided versus blind injection.  Verbal informed consent obtained.  Time-out conducted.  Noted no overlying erythema, induration, or other signs of local infection.  Skin prepped in a sterile fashion.  Local anesthesia: Topical Ethyl chloride.  With sterile technique and under real time ultrasound guidance: With a 21-gauge 2 inch needle injected into the 0.5 cc of 0.5% Marcaine.  We then injected with 2.5 cc of 3 centrifuge PRP Completed without difficulty  Advised to call if fevers/chills, erythema, induration, drainage, or persistent bleeding.  Sent in doxycycline just in case with me being  out of the office. Impression: Technically successful ultrasound guided injection.   Impression and Recommendations:     The above documentation has been reviewed and is accurate and complete Lyndal Pulley, DO

## 2021-02-06 ENCOUNTER — Other Ambulatory Visit: Payer: Self-pay

## 2021-02-06 ENCOUNTER — Ambulatory Visit: Payer: Self-pay

## 2021-02-06 ENCOUNTER — Ambulatory Visit (INDEPENDENT_AMBULATORY_CARE_PROVIDER_SITE_OTHER): Payer: Self-pay | Admitting: Family Medicine

## 2021-02-06 ENCOUNTER — Encounter: Payer: Self-pay | Admitting: Family Medicine

## 2021-02-06 VITALS — BP 116/78 | HR 69 | Ht 62.0 in | Wt 145.0 lb

## 2021-02-06 DIAGNOSIS — M722 Plantar fascial fibromatosis: Secondary | ICD-10-CM

## 2021-02-06 MED ORDER — DOXYCYCLINE HYCLATE 100 MG PO TABS: 100.0000 mg | ORAL_TABLET | Freq: Two times a day (BID) | ORAL | 0 refills | Status: AC

## 2021-02-06 NOTE — Assessment & Plan Note (Signed)
Injection given with PRP today. Given post PRP injection protocol.  Discussed which activities to do which wants to avoid.  Increase activity slowly.  Follow-up with me again in 6 weeks patient was given a wait-and-see prescription for doxycycline with me being out of the office but highly unlikely to have any type of infectious etiology

## 2021-02-06 NOTE — Progress Notes (Signed)
[facility] [place] [address] [place] Phone: [phone] Subjective:   IVilma Meckel, am serving as a scribe for Dr. Hulan Saas. This visit occurred during the SARS-CoV-2 public health emergency.  Safety protocols were in place, including screening questions prior to the visit, additional usage of staff PPE, and extensive cleaning of exam room while observing appropriate contact time as indicated for disinfecting solutions.   I'm seeing this patient by the request  of:  [name], DO  CC: foot pain   [id number]  01/18/2021 Patient does have a plan fasciitis.  He was seen.  Noted very well on the ultrasound today.  Patient may have a little bit better than what it was previously.  At this point patient has failed all other conservative therapy.  Possibility of PRP.  Patient would like to try the PRP before any type of surgical intervention.  Patient scheduled in the near future.  Updated 02/06/2021 SAMAYRA HEBEL is a 60 y.o. female coming in with complaint of right foot pain. PRP today.      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   Degenerative arthritis of knee, bilateral 05/06/2019   Elevated LDL cholesterol level 06/17/2017   Endometrial polyp    Essential hypertension 01/29/2019   Heart murmur Dr Johnsie Cancel   dx 2012--  Benign--  per echo abnormal mitral inflow   History of gastrointestinal ulcer    Hyperparathyroidism, primary ([place]) 01/11/2018   IBS (irritable bowel syndrome)    Incomplete right bundle branch block 04/17/2020   Joint swelling 02/11/2019   Loss of transverse plantar arch 07/27/2014   Low back pain 09/09/2017   Lower extremity edema 02/11/2019   Nasal congestion 04/17/2020   Nonallopathic lesion of cervical region 06/10/2018   Nonallopathic lesion of  lumbosacral region 06/10/2018   Nonallopathic lesion of rib cage 06/10/2018   Nonallopathic lesion of sacral region 06/10/2018   Nonallopathic lesion of thoracic region 06/10/2018   Palpitations 04/17/2020   Patellofemoral syndrome of both knees 12/19/2016   Inj 10/30 bilateral Bilateral injection September 09, 2017 repeat injections in March 17, 2018 August 06, 2018 Durolane approved 01/15/2019.  Injected January 28, 2019   Pharyngitis 06/21/2014   Plantar fasciitis, right 07/27/2014   Post-menopausal 06/17/2017   Seasonal allergies    Sinusitis, acute frontal 06/03/2014   Suprapubic pain 05/30/2017   Wears contact lenses    Past Surgical History:  Procedure Laterality Date   CARPAL TUNNEL RELEASE Left 05/15/2017   Procedure: LEFT CARPAL TUNNEL RELEASE;  Surgeon: [name], MD;  Location: [facility];  Service: Orthopedics;  Laterality: Left;   COLONOSCOPY     COLONOSCOPY WITH PROPOFOL  last one 04-28-2014   CYSTOSCOPY N/A 06/06/2014   Procedure: CYSTOSCOPY;  Surgeon: [name], MD;  Location: [facility];  Service: Gynecology;  Laterality: N/A;   DILATATION & CURRETTAGE/HYSTEROSCOPY WITH RESECTOCOPE N/A 06/06/2014   Procedure: DILATATION & CURETTAGE/HYSTEROSCOPY WITH RESECTOCOPE;  Surgeon: [name], MD;  Location: [facility];  Service: Gynecology;  Laterality: N/A;   LAPAROSCOPIC SALPINGO OOPHERECTOMY Left 06/06/2014   Procedure: LAPAROSCOPIC LEFT SALPINGO OOPHORECTOMY;  Surgeon: [name], MD;  Location: [facility];  Service: Gynecology;  Laterality: Left;   PARATHYROIDECTOMY Right 01/13/2018  Procedure: RIGHT SUPERIOR PARATHYROIDECTOMY;  Surgeon: Armandina Gemma, MD;  Location: [facility];  Service: General;  Laterality: Right;   PLANTAR FASCIA SURGERY Left 2009   topaz procedure   TRANSTHORACIC ECHOCARDIOGRAM  08-12-2007   normal LV,  ef 65-70%,  abnormal mitral inflow with trivial MR,  trivial PR and TR   Social History    Socioeconomic History   Marital status: Married    Spouse name: Not on file   Number of children: 2   Years of education: Not on file   Highest education level: Not on file  Occupational History   Occupation: preschool teacher    Employer: [employer]  Tobacco Use   Smoking status: Never   Smokeless tobacco: Never  Vaping Use   Vaping Use: Never used  Substance and Sexual Activity   Alcohol use: Yes    Comment: occasional   Drug use: Never   Sexual activity: Not on file  Other Topics Concern   Not on file  Social History Narrative   Married, Aeronautical engineer 2 daughters   1-2 caffeinated beverages/day   Social Determinants of Health   Financial Resource Strain: Not on file  Food Insecurity: Not on file  Transportation Needs: Not on file  Physical Activity: Not on file  Stress: Not on file  Social Connections: Not on file   No Known Allergies Family History  Problem Relation Age of Onset   Glaucoma Mother    Hypertension Mother    Hyperlipidemia Mother    Heart disease Mother    Heart disease Paternal Grandfather    Tuberculosis Maternal Grandfather    Diabetes Maternal Grandfather    Dementia Paternal Grandmother    Tuberculosis Other    Arthritis-Osteo Father    Colon cancer Neg Hx    Stomach cancer Neg Hx    Esophageal cancer Neg Hx    Pancreatic cancer Neg Hx      Current Outpatient Medications (Cardiovascular):    lisinopril-hydrochlorothiazide (ZESTORETIC) 10-12.5 MG tablet, TAKE 1 TABLET BY MOUTH EVERY DAY   nitroGLYCERIN (NITRODUR - DOSED IN MG/24 HR) 0.2 mg/hr patch, APPLY 1/4 OF A PATCH TO SKIN ONCE DAILY.  Current Outpatient Medications (Respiratory):    cetirizine (ZYRTEC) 10 MG tablet, TAKE 1 TABLET BY MOUTH EVERYDAY AT BEDTIME   montelukast (SINGULAIR) 10 MG tablet, Take 1 tablet (10 mg total) by mouth at bedtime.  Current Outpatient Medications (Analgesics):    meloxicam (MOBIC) 15 MG tablet, Take 1 tablet (15 mg total) by mouth  daily.   naproxen sodium (ALEVE) 220 MG tablet, Take 220 mg by mouth daily as needed (Joint pain).   Current Outpatient Medications (Other):    clotrimazole-betamethasone (LOTRISONE) cream, Apply 1 application topically 2 (two) times daily. For 2 weeks   famotidine (PEPCID) 40 MG tablet, Take 1 tablet (40 mg total) by mouth daily.   gabapentin (NEURONTIN) 100 MG capsule, TAKE 2 CAPSULES (200 MG TOTAL) BY MOUTH AT BEDTIME.   potassium chloride (KLOR-CON) 10 MEQ tablet, TAKE 2 TABLETS BY MOUTH EVERY DAY   traZODone (DESYREL) 100 MG tablet, TAKE 1 TABLET BY MOUTH EVERY DAY AT BEDTIME AS NEEDED FOR SLEEP   Vitamin D, Ergocalciferol, (DRISDOL) 1.25 MG (50000 UNIT) CAPS capsule, Take 1 capsule 1x a week     Review of Systems:  No headache, visual changes, nausea, vomiting, diarrhea, constipation, dizziness, abdominal pain, skin rash, fevers, chills, night sweats, weight loss, swollen lymph nodes, body aches, joint swelling, chest pain, shortness of breath, mood changes. POSITIVE  muscle aches  Objective  Height 5\' 2"  (1.575 m), last menstrual period 03/30/2016.   General: No apparent distress alert and oriented x3 mood and affect normal, dressed appropriately.  MSK:  Procedure: Real-time Ultrasound Guided Injection of  Device: GE Logiq Q7 Ultrasound guided injection is preferred based studies that show increased duration, increased effect, greater accuracy, decreased procedural pain, increased response rate, and decreased cost with ultrasound guided versus blind injection.  Verbal informed consent obtained.  Time-out conducted.  Noted no overlying erythema, induration, or other signs of local infection.  Skin prepped in a sterile fashion.  Local anesthesia: Topical Ethyl chloride.  With sterile technique and under real time ultrasound guidance:   Completed without difficulty  Pain immediately resolved suggesting accurate placement of the medication.  Advised to call if fevers/chills,  erythema, induration, drainage, or persistent bleeding.  Impression: Technically successful ultrasound guided injection.   Impression and Recommendations:     The above documentation has been reviewed and is accurate and complete Lyndal Pulley, DO

## 2021-02-06 NOTE — Patient Instructions (Signed)
PRP today Good to see you! See you again in 6-8 weeks

## 2021-02-10 ENCOUNTER — Other Ambulatory Visit: Payer: Self-pay | Admitting: Family Medicine

## 2021-02-10 DIAGNOSIS — I10 Essential (primary) hypertension: Secondary | ICD-10-CM

## 2021-02-11 DIAGNOSIS — J310 Chronic rhinitis: Secondary | ICD-10-CM | POA: Insufficient documentation

## 2021-02-11 NOTE — Assessment & Plan Note (Signed)
Probable OSA Appropriate discussion, questions answered Plan- sleep study

## 2021-02-11 NOTE — Assessment & Plan Note (Signed)
Probable allergic component. Environmental precautions and simple medication discussed Plan- Singulair, Nasalcrom

## 2021-02-22 ENCOUNTER — Encounter: Payer: Self-pay | Admitting: Family Medicine

## 2021-02-27 ENCOUNTER — Other Ambulatory Visit: Payer: Self-pay | Admitting: Family Medicine

## 2021-03-06 ENCOUNTER — Ambulatory Visit (INDEPENDENT_AMBULATORY_CARE_PROVIDER_SITE_OTHER): Payer: No Typology Code available for payment source | Admitting: Allergy and Immunology

## 2021-03-06 ENCOUNTER — Encounter: Payer: Self-pay | Admitting: Allergy and Immunology

## 2021-03-06 ENCOUNTER — Other Ambulatory Visit: Payer: Self-pay

## 2021-03-06 VITALS — BP 120/82 | HR 81 | Temp 98.3°F | Resp 16 | Ht 62.0 in | Wt 146.6 lb

## 2021-03-06 DIAGNOSIS — H4089 Other specified glaucoma: Secondary | ICD-10-CM

## 2021-03-06 DIAGNOSIS — G4720 Circadian rhythm sleep disorder, unspecified type: Secondary | ICD-10-CM

## 2021-03-06 DIAGNOSIS — J069 Acute upper respiratory infection, unspecified: Secondary | ICD-10-CM

## 2021-03-06 DIAGNOSIS — J310 Chronic rhinitis: Secondary | ICD-10-CM

## 2021-03-06 DIAGNOSIS — T485X5A Adverse effect of other anti-common-cold drugs, initial encounter: Secondary | ICD-10-CM

## 2021-03-06 DIAGNOSIS — K219 Gastro-esophageal reflux disease without esophagitis: Secondary | ICD-10-CM

## 2021-03-06 DIAGNOSIS — J3089 Other allergic rhinitis: Secondary | ICD-10-CM

## 2021-03-06 MED ORDER — RYALTRIS 665-25 MCG/ACT NA SUSP: 2.0000 | Freq: Every day | NASAL | 5 refills | Status: DC

## 2021-03-06 MED ORDER — OXYMETAZOLINE HCL 0.05 % NA SOLN: 1.0000 | Freq: Two times a day (BID) | NASAL | 5 refills | Status: DC | PRN

## 2021-03-06 MED ORDER — CETIRIZINE HCL 10 MG PO TABS: 10.0000 mg | ORAL_TABLET | Freq: Two times a day (BID) | ORAL | 1 refills | Status: DC | PRN

## 2021-03-06 NOTE — Progress Notes (Signed)
[place] - [place] - [place] - [place]   Dear Dr. Annamaria Boots,  Thank you for referring Elizabeth Meyer to the [facility] on 03/06/2021.   Below is a summation of this patient's evaluation and recommendations.  Thank you for your referral. I will keep you informed about this patient's response to treatment.   If you have any questions please do not hesitate to contact me.   Sincerely,  Jiles Prows, MD Allergy / Immunology [place]   ______________________________________________________________________    NEW PATIENT NOTE  Referring Provider: Deneise Lever, MD Primary Provider: Carollee Herter, Alferd Apa, DO Date of office visit: 03/06/2021    Subjective:   Chief Complaint:  Elizabeth Meyer (DOB: September 07, 1960) is a 60 y.o. female who presents to the clinic on 03/06/2021 with a chief complaint of Allergic Rhinitis  (Says she was referred by her physician she saw for snoring. Says she suffers from runny nose, sneezing, nasal congestion. ENT previously. On singular. Constant sniffing. ) .     HPI: Mauriah presents to this clinic in evaluation of persistent respiratory tract problems.  She has a long history of intermittent nasal congestion and sneezing and some difficulty sleeping at night and because of this issue she will intermittently use Vicks on a pretty infrequent basis.  In August 2021 she contracted COVID with significant upper airway symptoms and ever since that point in time she has had much more difficulty with nasal congestion and sneezing and rhinorrhea that interferes with her ability to sleep.  During her acute COVID infection she did lose her sense of smell and taste but that has completely returned.  For a while after her COVID infection she was relying on the use of nasal decongestant spray on a consistent basis so she could breathe at nighttime.  She  was started on Singulair in July 2022 and she has definitely had improvement regarding her nocturnal airway symptoms and now she uses Vicks 1 or 2 times per week.  She has tried nasal steroids in the past and they do help her nasal congestion but because of an issue with high intraocular pressures around 22 or so she discontinued the use of nasal steroids.  Her ophthalmologist has now documented pressures of around 18 and feels that she is stable regarding her predisposition to glaucoma.  She believes that exposure to dust and cats and pollens precipitates her nasal symptoms.  She does not have any anosmia and she does appear to have a dull frontal headache a few times per month that can last hours to a day without any associated neurological symptoms.  She also appears to have significant issues with sleep dysfunction.  She is using trazodone and melatonin and gabapentin at night for initiation insomnia and she has very fractured sleep throughout the night as well.  She has apparently had a sleep study which documented "borderline-5" levels of sleep apnea.  She has seen ENT, Dr. Redmond Baseman, about 1 year ago but not much came from this interaction.  She does have reflux disease manifested as chest pressure and pain soon after eating for which she initially started Protonix as directed by gastroenterology and is now using famotidine at 40 mg a day with very good response.  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   Degenerative arthritis of knee, bilateral 05/06/2019   Elevated LDL cholesterol level 06/17/2017   Endometrial polyp    Essential hypertension 01/29/2019   Heart murmur Dr Johnsie Cancel   dx 2012--  Benign--  per echo abnormal mitral inflow   History of gastrointestinal ulcer    Hyperparathyroidism, primary ([place]) 01/11/2018   IBS (irritable  bowel syndrome)    Incomplete right bundle branch block 04/17/2020   Joint swelling 02/11/2019   Loss of transverse plantar arch 07/27/2014   Low back pain 09/09/2017   Lower extremity edema 02/11/2019   Nasal congestion 04/17/2020   Nonallopathic lesion of cervical region 06/10/2018   Nonallopathic lesion of lumbosacral region 06/10/2018   Nonallopathic lesion of rib cage 06/10/2018   Nonallopathic lesion of sacral region 06/10/2018   Nonallopathic lesion of thoracic region 06/10/2018   Palpitations 04/17/2020   Patellofemoral syndrome of both knees 12/19/2016   Inj 10/30 bilateral Bilateral injection September 09, 2017 repeat injections in March 17, 2018 August 06, 2018 Durolane approved 01/15/2019.  Injected January 28, 2019   Pharyngitis 06/21/2014   Plantar fasciitis, right 07/27/2014   Post-menopausal 06/17/2017   Seasonal allergies    Sinusitis, acute frontal 06/03/2014   Suprapubic pain 05/30/2017   Wears contact lenses     Past Surgical History:  Procedure Laterality Date   CARPAL TUNNEL RELEASE Left 05/15/2017   Procedure: LEFT CARPAL TUNNEL RELEASE;  Surgeon: [name], MD;  Location: [facility];  Service: Orthopedics;  Laterality: Left;   COLONOSCOPY     COLONOSCOPY WITH PROPOFOL  last one 04-28-2014   CYSTOSCOPY N/A 06/06/2014   Procedure: CYSTOSCOPY;  Surgeon: [name], MD;  Location: [facility];  Service: Gynecology;  Laterality: N/A;   DILATATION & CURRETTAGE/HYSTEROSCOPY WITH RESECTOCOPE N/A 06/06/2014   Procedure: DILATATION & CURETTAGE/HYSTEROSCOPY WITH RESECTOCOPE;  Surgeon: [name], MD;  Location: [facility];  Service: Gynecology;  Laterality: N/A;   LAPAROSCOPIC SALPINGO OOPHERECTOMY Left 06/06/2014   Procedure: LAPAROSCOPIC LEFT SALPINGO OOPHORECTOMY;  Surgeon: [name], MD;  Location: [facility];  Service: Gynecology;  Laterality: Left;   PARATHYROIDECTOMY Right 01/13/2018   Procedure: RIGHT SUPERIOR  PARATHYROIDECTOMY;  Surgeon: Armandina Gemma, MD;  Location[facility];  Service: General;  Laterality: Right;   PLANTAR FASCIA SURGERY Left 2009   topaz procedure   TRANSTHORACIC ECHOCARDIOGRAM  08-12-2007   normal LV,  ef 65-70%,  abnormal mitral inflow with trivial MR,  trivial PR and TR    Allergies as of 03/06/2021   No Known Allergies      Medication List    cetirizine 10 MG tablet Commonly known as: ZYRTEC TAKE 1 TABLET BY MOUTH EVERYDAY AT BEDTIME   clotrimazole-betamethasone cream Commonly known as: Lotrisone Apply 1 application topically 2 (two) times daily. For 2 weeks   famotidine 40 MG tablet Commonly known as: PEPCID Take 1 tablet (40 mg total) by mouth daily.   gabapentin 100 MG capsule Commonly known as: NEURONTIN TAKE 2 CAPSULES (200 MG TOTAL) BY MOUTH AT BEDTIME.   lisinopril-hydrochlorothiazide 10-12.5 MG tablet Commonly known as: ZESTORETIC TAKE 1 TABLET BY MOUTH EVERY DAY   Melatonin 5 MG Caps Take by mouth.   meloxicam 15 MG tablet Commonly known as: MOBIC TAKE 1 TABLET (15 MG TOTAL) BY MOUTH DAILY.   montelukast 10 MG tablet Commonly known as: SINGULAIR Take 1 tablet (10 mg total) by mouth at bedtime.  naproxen sodium 220 MG tablet Commonly known as: ALEVE Take 220 mg by mouth daily as needed (Joint pain).   potassium chloride 10 MEQ tablet Commonly known as: KLOR-CON TAKE 2 TABLETS BY MOUTH EVERY DAY   traZODone 100 MG tablet Commonly known as: DESYREL TAKE 1 TABLET BY MOUTH EVERY DAY AT BEDTIME AS NEEDED FOR SLEEP   Vitamin D (Ergocalciferol) 1.25 MG (50000 UNIT) Caps capsule Commonly known as: DRISDOL Take 1 capsule 1x a week    Review of systems negative except as noted in HPI / PMHx or noted below:  Review of Systems  Constitutional: Negative.   HENT: Negative.    Eyes: Negative.   Respiratory: Negative.    Cardiovascular: Negative.   Gastrointestinal: Negative.   Genitourinary: Negative.   Musculoskeletal: Negative.    Skin: Negative.   Neurological: Negative.   Endo/Heme/Allergies: Negative.   Psychiatric/Behavioral: Negative.     Family History  Problem Relation Age of Onset   Allergic rhinitis Mother    Glaucoma Mother    Hypertension Mother    Hyperlipidemia Mother    Heart disease Mother    Arthritis-Osteo Father    Tuberculosis Maternal Grandfather    Diabetes Maternal Grandfather    Asthma Paternal Grandmother    Dementia Paternal Grandmother    Heart disease Paternal Grandfather    Tuberculosis Other    Colon cancer Neg Hx    Stomach cancer Neg Hx    Esophageal cancer Neg Hx    Pancreatic cancer Neg Hx     Social History   Socioeconomic History   Marital status: Married    Spouse name: Not on file   Number of children: 2   Years of education: Not on file   Highest education level: Not on file  Occupational History   Occupation: preschool teacher    Employer: [employer]  Tobacco Use   Smoking status: Never   Smokeless tobacco: Never  Vaping Use   Vaping Use: Never used  Substance and Sexual Activity   Alcohol use: Yes    Comment: occasional   Drug use: Never   Sexual activity: Yes    Birth control/protection: Post-menopausal  Other Topics Concern   Not on file  Social History Narrative   Married, Aeronautical engineer 2 daughters   1-2 caffeinated beverages/day   Social Determinants of Health   Environmental and Social history  Lives in a house with a dry environment, no animals currently inside the household, a dog located inside the household until 18 December 2020, carpet in the bedroom, no plastic on the bed, no plastic on the pillow, no smoking ongoing with inside the household.  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  Objective:   Vitals:   03/06/21 0936  BP: 120/82  Pulse: 81  Resp: 16  Temp: 98.3 F (36.8 C)  SpO2: 99%   Height: 5\' 2"  (157.5 cm) Weight: 146 lb 9.6 oz (66.5 kg)  Physical Exam Constitutional:       Appearance: She is not diaphoretic.  HENT:     Head: Normocephalic.     Right Ear: Tympanic membrane, ear canal and external ear normal.     Left Ear: Tympanic membrane, ear canal and external ear normal.     Nose: Nose normal. No mucosal edema or rhinorrhea.     Mouth/Throat:     Pharynx: Uvula midline. No oropharyngeal exudate.  Eyes:     Conjunctiva/sclera: Conjunctivae normal.  Neck:  Thyroid: No thyromegaly.     Trachea: Trachea normal. No tracheal tenderness or tracheal deviation.  Cardiovascular:     Rate and Rhythm: Normal rate and regular rhythm.     Heart sounds: Normal heart sounds, S1 normal and S2 normal. No murmur heard. Pulmonary:     Effort: No respiratory distress.     Breath sounds: Normal breath sounds. No stridor. No wheezing or rales.  Lymphadenopathy:     Head:     Right side of head: No tonsillar adenopathy.     Left side of head: No tonsillar adenopathy.     Cervical: No cervical adenopathy.  Skin:    Findings: No erythema or rash.     Nails: There is no clubbing.  Neurological:     Mental Status: She is alert.    Diagnostics: Allergy skin tests were performed.  She demonstrated hypersensitivity to house dust mite, cat, dog, and mold.  Results of blood tests obtained 19 June 2020 identified WBC 5.6, absolute eosinophil 200, absolute basophil 100, absolute lymphocyte 1700, hemoglobin 12.9, platelet 295  Assessment and Plan:    1. Perennial allergic rhinitis   2. Rhinitis medicamentosa   3. Dysfunction of sleep stage or arousal   4. Gastroesophageal reflux disease, unspecified whether esophagitis present   5. Other glaucoma of both eyes     1.  Allergen avoidance measures - dust mite, cat, dog, mold  2.  Treat and prevent inflammation:  A. Ryaltris - 2 sprays each nostril in evening B. Montelukast 10 mg - 1 tablet in evening  3. If needed:  A. Nasal saline B. Cetirizine 10 mg - 1 tablet 1 time per day  4. Minimize caffeine /  chocolate consumption to minimize reflux events  5. Further treatment for sleep dysfunction???  6. Check eye pressure while using Ryaltris  7. If need to use Vicks / Afrin /oxymetazoline, using single nostril and use with Ryaltris  8. Immunotherapy???  9. Return to clinic in 12 weeks or earlier if problem  I suspect that Shamarie's atopic respiratory disease will come under very good control with the use of allergen avoidance measures directed against specific aeroallergens and anti-inflammatory agents for her airway as noted above.  Her combination nasal steroid and antihistamine will need to be assessed regarding possible side effects by checking her intraocular pressure while utilizing this agent.  I have cautioned her about using nasal decongestant sprays on a frequent basis and if she does need to use one of the spray she should only use a single nostril and use it in conjunction with her combination nasal steroid and antihistamine.  As well, had a talk with her today about minimizing caffeine to help with her reflux.  She is already on several different medications for her sleep dysfunction and if she continues to have problems after we eliminate all inflammation of her upper airway with the plan noted above then this will require further evaluation and treatment.  Jiles Prows, MD Allergy / Immunology [facility]

## 2021-03-06 NOTE — Patient Instructions (Addendum)
  1.  Allergen avoidance measures - dust mite, cat, dog, mold  2.  Treat and prevent inflammation:  A. Ryaltris - 2 sprays each nostril in evening B. Montelukast 10 mg - 1 tablet in evening  3. If needed:  A. Nasal saline B. Cetirizine 10 mg - 1 tablet 1 time per day  4. Minimize caffeine / chocolate consumption to minimize reflux events  5. Further treatment for sleep dysfunction???  6. Check eye pressure while using Ryaltris  7. If need to use Vicks / Afrin /oxymetazoline, using single nostril and use with Ryaltris  8. Immunotherapy???  9. Return to clinic in 12 weeks or earlier if problem

## 2021-03-07 ENCOUNTER — Encounter: Payer: Self-pay | Admitting: Allergy and Immunology

## 2021-03-07 NOTE — Progress Notes (Signed)
[name] [address] [place] Phone: [phone] Subjective:   IVilma Meyer, am serving as a scribe for Dr. Hulan Saas. This visit occurred during the SARS-CoV-2 public health emergency.  Safety protocols were in place, including screening questions prior to the visit, additional usage of staff PPE, and extensive cleaning of exam room while observing appropriate contact time as indicated for disinfecting solutions.   I'm seeing this patient by the request  of:  [name], DO  CC: Right foot pain follow-up  [id number]  02/06/2021 Injection given with PRP today. Given post PRP injection protocol.  Discussed which activities to do which wants to avoid.  Increase activity slowly.  Follow-up with me again in 6 weeks patient was given a wait-and-see prescription for doxycycline with me being out of the office but highly unlikely to have any type of infectious etiology  Updated 03/08/2021 Elizabeth Meyer is a 60 y.o. female coming in with complaint of foot pain. Pain is still where the tear is. Walking feels pain on achilles medial side. Constant pain mid foot pain when plantarflexion.  Patient states that it is more around the Achilles at this time.  Discusses the pain that is more of a throbbing aching pain at all times.  Feels like this is different than what it was previously.  He still has the heel pain which states that the heel pain is not worse but now with the posterior ankle pain that seems to be the more contributing factor.     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   Degenerative arthritis of knee, bilateral 05/06/2019   Elevated LDL cholesterol level 06/17/2017   Endometrial polyp    Essential hypertension 01/29/2019   Heart murmur Dr Johnsie Cancel   dx 2012--   Benign--  per echo abnormal mitral inflow   History of gastrointestinal ulcer    Hyperparathyroidism, primary ([place]) 01/11/2018   IBS (irritable bowel syndrome)    Incomplete right bundle branch block 04/17/2020   Joint swelling 02/11/2019   Loss of transverse plantar arch 07/27/2014   Low back pain 09/09/2017   Lower extremity edema 02/11/2019   Nasal congestion 04/17/2020   Nonallopathic lesion of cervical region 06/10/2018   Nonallopathic lesion of lumbosacral region 06/10/2018   Nonallopathic lesion of rib cage 06/10/2018   Nonallopathic lesion of sacral region 06/10/2018   Nonallopathic lesion of thoracic region 06/10/2018   Palpitations 04/17/2020   Patellofemoral syndrome of both knees 12/19/2016   Inj 10/30 bilateral Bilateral injection September 09, 2017 repeat injections in March 17, 2018 August 06, 2018 Durolane approved 01/15/2019.  Injected January 28, 2019   Pharyngitis 06/21/2014   Plantar fasciitis, right 07/27/2014   Post-menopausal 06/17/2017   Seasonal allergies    Sinusitis, acute frontal 06/03/2014   Suprapubic pain 05/30/2017   Wears contact lenses    Past Surgical History:  Procedure Laterality Date   CARPAL TUNNEL RELEASE Left 05/15/2017   Procedure: LEFT CARPAL TUNNEL RELEASE;  Surgeon: [name], MD;  Location: [facility];  Service: Orthopedics;  Laterality: Left;   COLONOSCOPY     COLONOSCOPY WITH PROPOFOL  last one 04-28-2014   CYSTOSCOPY N/A 06/06/2014   Procedure: CYSTOSCOPY;  Surgeon: [name], MD;  Location: [facility];  Service:  Gynecology;  Laterality: N/A;   DILATATION & CURRETTAGE/HYSTEROSCOPY WITH RESECTOCOPE N/A 06/06/2014   Procedure: DILATATION & CURETTAGE/HYSTEROSCOPY WITH RESECTOCOPE;  Surgeon: [name], MD;  Location: [facility];  Service: Gynecology;  Laterality: N/A;   LAPAROSCOPIC SALPINGO OOPHERECTOMY Left 06/06/2014   Procedure: LAPAROSCOPIC LEFT SALPINGO OOPHORECTOMY;  Surgeon: [name], MD;  Location:  [facility];  Service: Gynecology;  Laterality: Left;   PARATHYROIDECTOMY Right 01/13/2018   Procedure: RIGHT SUPERIOR PARATHYROIDECTOMY;  Surgeon: Armandina Gemma, MD;  Location: [facility];  Service: General;  Laterality: Right;   PLANTAR FASCIA SURGERY Left 2009   topaz procedure   TRANSTHORACIC ECHOCARDIOGRAM  08-12-2007   normal LV,  ef 65-70%,  abnormal mitral inflow with trivial MR,  trivial PR and TR   Social History   Socioeconomic History   Marital status: Married    Spouse name: Not on file   Number of children: 2   Years of education: Not on file   Highest education level: Not on file  Occupational History   Occupation: preschool teacher    Employer: [employer]  Tobacco Use   Smoking status: Never   Smokeless tobacco: Never  Vaping Use   Vaping Use: Never used  Substance and Sexual Activity   Alcohol use: Yes    Comment: occasional   Drug use: Never   Sexual activity: Yes    Birth control/protection: Post-menopausal  Other Topics Concern   Not on file  Social History Narrative   Married, Aeronautical engineer 2 daughters   1-2 caffeinated beverages/day   Social Determinants of Health   Financial Resource Strain: Not on file  Food Insecurity: Not on file  Transportation Needs: Not on file  Physical Activity: Not on file  Stress: Not on file  Social Connections: Not on file   No Known Allergies Family History  Problem Relation Age of Onset   Allergic rhinitis Mother    Glaucoma Mother    Hypertension Mother    Hyperlipidemia Mother    Heart disease Mother    Arthritis-Osteo Father    Tuberculosis Maternal Grandfather    Diabetes Maternal Grandfather    Asthma Paternal Grandmother    Dementia Paternal Grandmother    Heart disease Paternal Grandfather    Tuberculosis Other    Colon cancer Neg Hx    Stomach cancer Neg Hx    Esophageal cancer Neg Hx    Pancreatic cancer Neg Hx     Current Outpatient Medications (Endocrine & Metabolic):     predniSONE (DELTASONE) 50 MG tablet, Take one tablet daily for the next 5 days.  Current Outpatient Medications (Cardiovascular):    lisinopril-hydrochlorothiazide (ZESTORETIC) 10-12.5 MG tablet, TAKE 1 TABLET BY MOUTH EVERY DAY  Current Outpatient Medications (Respiratory):    cetirizine (ZYRTEC) 10 MG tablet, Take 1 tablet (10 mg total) by mouth 2 (two) times daily as needed for allergies (Can use an extra dose during flare ups).   montelukast (SINGULAIR) 10 MG tablet, Take 1 tablet (10 mg total) by mouth at bedtime.   Olopatadine-Mometasone (RYALTRIS) 665-25 MCG/ACT SUSP, Place 2 sprays into the nose daily.   oxymetazoline (AFRIN NASAL SPRAY) 0.05 % nasal spray, Place 1 spray into both nostrils 2 (two) times daily as needed for congestion (Use for five days at a time).  Current Outpatient Medications (Analgesics):    meloxicam (MOBIC) 15 MG tablet, TAKE 1 TABLET (15 MG TOTAL) BY MOUTH DAILY.   naproxen sodium (ALEVE) 220 MG tablet, Take 220 mg by  mouth daily as needed (Joint pain).   Current Outpatient Medications (Other):    clotrimazole-betamethasone (LOTRISONE) cream, Apply 1 application topically 2 (two) times daily. For 2 weeks   famotidine (PEPCID) 40 MG tablet, Take 1 tablet (40 mg total) by mouth daily.   gabapentin (NEURONTIN) 100 MG capsule, TAKE 2 CAPSULES (200 MG TOTAL) BY MOUTH AT BEDTIME.   Melatonin 5 MG CAPS, Take by mouth.   potassium chloride (KLOR-CON) 10 MEQ tablet, TAKE 2 TABLETS BY MOUTH EVERY DAY   traZODone (DESYREL) 100 MG tablet, TAKE 1 TABLET BY MOUTH EVERY DAY AT BEDTIME AS NEEDED FOR SLEEP   Vitamin D, Ergocalciferol, (DRISDOL) 1.25 MG (50000 UNIT) CAPS capsule, Take 1 capsule 1x a week   Reviewed prior external information including notes and imaging from  primary care provider As well as notes that were available from care everywhere and other healthcare systems.  Past medical history, social, surgical and family history all reviewed in electronic  medical record.  No pertanent information unless stated regarding to the chief complaint.   Review of Systems:  No headache, visual changes, nausea, vomiting, diarrhea, constipation, dizziness, abdominal pain, skin rash, fevers, chills, night sweats, weight loss, swollen lymph nodes, body aches, joint swelling, chest pain, shortness of breath, mood changes. POSITIVE muscle aches  Objective  Blood pressure 122/88, pulse (!) 105, height 5\' 2"  (1.575 m), weight 146 lb (66.2 kg), last menstrual period 03/30/2016, SpO2 98 %.   General: No apparent distress alert and oriented x3 mood and affect normal, dressed appropriately.  HEENT: Pupils equal, extraocular movements intact  Respiratory: Patient's speak in full sentences and does not appear short of breath  Cardiovascular: No lower extremity edema, non tender, no erythema  Gait mild antalgic Patient right heel exam does have some tenderness to palpation over the plantar fascial again.  Patient has more pain over the medial than lateral aspect of the calcaneal and some over the posterior tibialis nerve.  Mild positive Tinel's over the tarsal tunnel.  No significant swelling noted.  No significant tightness of the Achilles.  Limited muscular skeletal ultrasound was performed and interpreted by Hulan Saas, M  Limited ultrasound shows when looking at the plantar fascia still some mild interstitial tearing noted near the origin of the calcaneal area but patient does have some scar tissue formation.  Decrease in dilation of the plantar fascia also noted.  No significant irregularity of the calcaneal noted.  Patient's Achilles appears to be unremarkable.  No retrocalcaneal bursa appreciated.  Patient does have dilation of the posterior tibial nerve Impression:Mild potential improvement in the plantar fascia with dilation of the posterior tibial nerve otherwise fairly unremarkable. Impression and Recommendations:     The above documentation has been  reviewed and is accurate and complete Lyndal Pulley, DO

## 2021-03-08 ENCOUNTER — Encounter: Payer: Self-pay | Admitting: Neurology

## 2021-03-08 ENCOUNTER — Encounter: Payer: Self-pay | Admitting: Family Medicine

## 2021-03-08 ENCOUNTER — Ambulatory Visit (INDEPENDENT_AMBULATORY_CARE_PROVIDER_SITE_OTHER): Payer: No Typology Code available for payment source

## 2021-03-08 ENCOUNTER — Ambulatory Visit: Payer: Self-pay

## 2021-03-08 ENCOUNTER — Other Ambulatory Visit: Payer: Self-pay

## 2021-03-08 ENCOUNTER — Ambulatory Visit (INDEPENDENT_AMBULATORY_CARE_PROVIDER_SITE_OTHER): Payer: No Typology Code available for payment source | Admitting: Family Medicine

## 2021-03-08 VITALS — BP 122/88 | HR 105 | Ht 62.0 in | Wt 146.0 lb

## 2021-03-08 DIAGNOSIS — M722 Plantar fascial fibromatosis: Secondary | ICD-10-CM

## 2021-03-08 DIAGNOSIS — R202 Paresthesia of skin: Secondary | ICD-10-CM

## 2021-03-08 DIAGNOSIS — M79671 Pain in right foot: Secondary | ICD-10-CM | POA: Diagnosis not present

## 2021-03-08 MED ORDER — PREDNISONE 50 MG PO TABS: ORAL_TABLET | ORAL | 0 refills | Status: DC

## 2021-03-08 NOTE — Patient Instructions (Addendum)
Posterior tib nerve is enlarged Nerve conduction test ordered ABI and right lower extremity doppler Pred 50mg  5 days Increase gabapentin to 300mg  nightly Xray today  [facility]  ([facility] [employer] in [facility]) [address], [address] [place], [place] [address] [phone]

## 2021-03-08 NOTE — Assessment & Plan Note (Signed)
Patient had the PRP injection previously.  Patient has not made significant improvement.  On ultrasound today does appear to have some scar tissue formation that does show some healing interval.  Patient does have some dilation of the posterior tibial nerve that is more contributing potentially at this point and we discussed why patient is having more posterior capsule pain.  In addition to this patient does have some very mild narrowing of the tarsal tunnel.  Patient is having pain on both sides of the calcaneus so we will get repeat x-rays to further evaluate.  Patient has had lumbar radiculopathy previously as well and will get a nerve conduction test to make sure that this is not secondary to her back.  Prednisone given and increase gabapentin.  Heel lift given for the shoe.  If continuing to have difficulty would consider the possibility of laboratory work-up as well.  ABIs and Doppler ordered today as well secondary to the mild dilation noted of the posterior tibialis vein.

## 2021-03-09 ENCOUNTER — Other Ambulatory Visit: Payer: Self-pay | Admitting: Family Medicine

## 2021-03-09 DIAGNOSIS — M79671 Pain in right foot: Secondary | ICD-10-CM

## 2021-03-12 ENCOUNTER — Other Ambulatory Visit: Payer: Self-pay

## 2021-03-12 ENCOUNTER — Ambulatory Visit (HOSPITAL_COMMUNITY)
Admission: RE | Admit: 2021-03-12 | Discharge: 2021-03-12 | Disposition: A | Discharge status: Home / Self Care | Payer: No Typology Code available for payment source | Source: Ambulatory Visit | Attending: Cardiology | Admitting: Cardiology

## 2021-03-12 DIAGNOSIS — M79671 Pain in right foot: Secondary | ICD-10-CM | POA: Diagnosis present

## 2021-03-13 ENCOUNTER — Encounter: Payer: No Typology Code available for payment source | Admitting: Neurology

## 2021-03-14 ENCOUNTER — Other Ambulatory Visit: Payer: Self-pay

## 2021-03-14 ENCOUNTER — Ambulatory Visit (HOSPITAL_COMMUNITY)
Admission: RE | Admit: 2021-03-14 | Discharge: 2021-03-14 | Disposition: A | Discharge status: Home / Self Care | Payer: No Typology Code available for payment source | Source: Ambulatory Visit | Attending: Cardiovascular Disease | Admitting: Cardiovascular Disease

## 2021-03-14 DIAGNOSIS — M79671 Pain in right foot: Secondary | ICD-10-CM | POA: Insufficient documentation

## 2021-03-15 ENCOUNTER — Ambulatory Visit (INDEPENDENT_AMBULATORY_CARE_PROVIDER_SITE_OTHER): Payer: No Typology Code available for payment source | Admitting: Podiatrist

## 2021-03-15 ENCOUNTER — Encounter: Payer: Self-pay | Admitting: Podiatrist

## 2021-03-15 DIAGNOSIS — G5781 Other specified mononeuropathies of right lower limb: Secondary | ICD-10-CM

## 2021-03-15 DIAGNOSIS — M629 Disorder of muscle, unspecified: Secondary | ICD-10-CM | POA: Diagnosis not present

## 2021-03-15 DIAGNOSIS — M722 Plantar fascial fibromatosis: Secondary | ICD-10-CM | POA: Diagnosis not present

## 2021-03-15 DIAGNOSIS — G5751 Tarsal tunnel syndrome, right lower limb: Secondary | ICD-10-CM | POA: Diagnosis not present

## 2021-03-15 NOTE — Patient Instructions (Signed)
Tarsal Tunnel Syndrome Tarsal tunnel syndrome is a condition that happens when a nerve called the tibial nerve is irritated or squeezed (compressed) as it passes through an area on the inside of your ankle (tarsal tunnel). The tarsal tunnel is a narrow passage through the connective tissue and bones in your feet (tarsals). The tibial nerve passes behind the large bony bump at your inner ankle (medial malleolus) and sends branches to your foot and toes. This nerve enables feeling by passing signals to your heel, the bottom of your foot, and some of your toe muscles. Tarsal tunnel syndrome usually causes ankle and foot pain that gets worse with activity. What are the causes? Tarsal tunnel syndrome can be caused by any condition that narrows the space in the tarsal tunnel. Athletes may get tarsal tunnel syndrome from a fractured ankle or from an outward (eversion) ankle sprain that results in scarring or swelling. Other common causes include: Overpronation. This is when your feet roll inward and flatten too much when you stand, walk, or run. Extra pressure on the tarsal tunnel area from tight or stiff shoes or boots. Decreased room in the tarsal tunnel due to small, fluid-filled sacs (cysts) or growths on the bones near the tunnel (exostosis). What increases the risk? This condition is more likely to develop in people who: Play sports where they wear high, stiff boots, such as downhill skiing. Play sports with repetitive motion, such as running. Play sports on uneven surfaces that can lead to a sprained ankle, such as soccer or football. Have had an inner ankle injury. Have flat feet. Have other conditions, such as diabetes, hypothyroidism, or rheumatoid arthritis. What are the signs or symptoms? Symptoms of this condition include: Burning pain behind the ankle, in the heel, or in the foot. This pain gets worse if you are standing, walking, or running. Numbness or a prickling and tingling sensation  in your heel, foot, or toes. Swelling in your ankle or heel. At first, your symptoms may get worse with activity and be relieved with rest. Over time, your symptoms may become constant or come on sooner with less activity. How is this diagnosed? This condition is diagnosed based on: Your symptoms. Your medical history. A physical exam. Your health care provider may tap on the area below your ankle to check for tingling in your foot or toes. You may also have other tests, including: X-rays to check bone structure. An MRI or ultrasound to examine nerve and tendon structures and find where your nerve is getting compressed. A study of nerve function (electromyography or EMG). How is this treated? Treatment may include: Wearing a removable splint or boot for ankle support. Using a shoe insert (orthotic) to help support your arch. Taking NSAIDs to reduce pain. Using ice to reduce swelling. Having medicine injected into your ankle joint to reduce pain and swelling. Starting range-of-motion exercises and strengthening exercises. Gradually returning to full activity. The timing will depend on the severity of your condition and your response to treatment. Surgery. Follow these instructions at home: If you have a splint or boot: Wear the splint or boot as told by your health care provider. Remove it only as told by your health care provider. Loosen the splint or boot if your toes tingle, become numb, or turn cold and blue. Keep the splint or boot clean. If your splint or boot is not waterproof: Do not let it get wet. Cover it with a watertight covering when you take a bath or shower.  Ask your health care provider when it is safe to drive if your splint or boot is on a foot that you use for driving. Managing pain, stiffness, and swelling  If directed, put ice on the injured area. If you have a removable splint or boot, remove it as told by your health care provider. Put ice in a plastic  bag. Place a towel between your skin and the bag. Leave the ice on for 20 minutes, 2-3 times a day. Move your toes often to reduce stiffness and swelling. Raise (elevate) your foot above the level of your heart while you are sitting or lying down. Activity Return to your normal activities as told by your health care provider. Ask your health care provider what activities are safe for you. Do exercises as told by your health care provider. General instructions Take over-the-counter and prescription medicines only as told by your health care provider. Do not use any products that contain nicotine or tobacco, such as cigarettes, e-cigarettes, and chewing tobacco. These can delay healing. If you need help quitting, ask your health care provider. Keep all follow-up visits as told by your health care provider. This is important. How is this prevented? Give your body time to rest between periods of activity. Make sure to wear supportive and comfortable shoes during athletic activity. Do not overtighten ski boots or the laces on high-top shoes. Be safe and responsible while being active to avoid falls. Contact a health care provider if: Your ankle pain is not getting better. You are unable to support (bear) body weight on your foot without pain. Summary Tarsal tunnel syndrome is a condition that happens when a nerve called the tibial nerve is irritated or squeezed (compressed) as it passes through an area on the inside of your ankle (tarsal tunnel). Tarsal tunnel syndrome usually causes ankle and foot pain that gets worse with activity. This condition may be treated with a splint or boot, shoe inserts, ice, exercises, medicines, and surgery if needed. Follow your health care provider's instructions for caring for your splint or boot. Contact your health care provider if your ankle pain is not getting better, or if you are unable to bear your body weight without pain. This information is not  intended to replace advice given to you by your health care provider. Make sure you discuss any questions you have with your health care provider. Document Revised: 07/13/2020 Document Reviewed: 07/13/2020 Elsevier Patient Education  [place].

## 2021-03-15 NOTE — Progress Notes (Signed)
Chief Complaint  Patient presents with   Foot Pain    Plantar and posterior heel right - been treated for plantar fascial tear (boot, exercises, PRP x 5 weeks ago), said better by ultrasound, has had xrays (03/08/21) and MRI in August, said ultrasound showed posterior tibial nerve enlarged/inflamed, having trouble being on feet long periods, intensity is worse, nerve conduction study scheduled-no other treatment, did Rx prednisone, but hasn't taken yet, doppler showed normal blood flow (done this week)   New Patient (Initial Visit)     HPI: Patient is 60 y.o. female who presents today for right heel pain.  She relates she has had heel pain for several months and was initially diagnosed with plantar fasciitis.  She had an MRI which showed plantar fasciitis and subsequent ultrasounds which were favorable for a plantar fascial tear.  There is also concern for swelling and potential entrapment of the posterior tibial nerve.  She is scheduled for a nerve study on January 3rd but hopes to get in earlier.  She has had 2 steroid injections and neither helped longterm.  She also has worn a walking boot and has done stretching exercises which also offered little pain relief.  She most recently received an injection of PRP about 5 weeks ago.    Patient Active Problem List   Diagnosis Date Noted   Chronic rhinitis 02/11/2021   Right foot pain 10/30/2020   Dysphagia 06/19/2020   Hyperlipidemia 06/19/2020   Iron deficiency anemia 06/19/2020   Daytime somnolence 06/19/2020   OSA (obstructive sleep apnea) 06/19/2020   Palpitations 04/17/2020   Incomplete right bundle branch block 04/17/2020   Nasal congestion 04/17/2020   Seasonal allergies    History of gastrointestinal ulcer    Heart murmur    Complex cyst of left ovary    Degenerative arthritis of knee, bilateral 05/06/2019   Lower extremity edema 02/11/2019   Joint swelling 02/11/2019   Primary hypertension 01/29/2019   Nonallopathic lesion of  sacral region 06/10/2018   Nonallopathic lesion of lumbosacral region 06/10/2018   Nonallopathic lesion of thoracic region 06/10/2018   Nonallopathic lesion of cervical region 06/10/2018   Nonallopathic lesion of rib cage 06/10/2018   Hyperparathyroidism, primary ([place]) 01/11/2018   Low back pain 09/09/2017   Elevated LDL cholesterol level 06/17/2017   Hypothyroidism 06/17/2017   Post-menopausal 06/17/2017   Cough 05/30/2017   Suprapubic pain 05/30/2017   Patellofemoral syndrome of both knees 12/19/2016   Allergic dermatitis 09/16/2015   Plantar fasciitis, right 07/27/2014   Loss of transverse plantar arch 07/27/2014   Pharyngitis 06/21/2014   Endometrial polyp 06/06/2014    Class: Present on Admission   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 180 tablet 1   clotrimazole-betamethasone (LOTRISONE) cream Apply 1 application topically 2 (two) times daily. For 2 weeks 60 g 0   famotidine (PEPCID) 40 MG tablet Take 1 tablet (40 mg total) by mouth daily. 30 tablet 11   gabapentin (NEURONTIN) 100 MG capsule TAKE 2 CAPSULES (200 MG TOTAL) BY MOUTH AT BEDTIME. 180 capsule 2   lisinopril-hydrochlorothiazide (ZESTORETIC) 10-12.5 MG tablet TAKE 1 TABLET BY MOUTH EVERY DAY 90 tablet 1  Melatonin 5 MG CAPS Take by mouth.     meloxicam (MOBIC) 15 MG tablet TAKE 1 TABLET (15 MG TOTAL) BY MOUTH DAILY. 30 tablet 0   montelukast (SINGULAIR) 10 MG tablet Take 1 tablet (10 mg total) by mouth at bedtime. 30 tablet 11   naproxen sodium (ALEVE) 220 MG tablet Take 220 mg by mouth daily as needed (Joint pain).     Olopatadine-Mometasone (RYALTRIS) [id number] MCG/ACT SUSP Place 2 sprays  into the nose daily. 29 g 5   oxymetazoline (AFRIN NASAL SPRAY) 0.05 % nasal spray Place 1 spray into both nostrils 2 (two) times daily as needed for congestion (Use for five days at a time). 37 mL 5   potassium chloride (KLOR-CON) 10 MEQ tablet TAKE 2 TABLETS BY MOUTH EVERY DAY 180 tablet 1   predniSONE (DELTASONE) 50 MG tablet Take one tablet daily for the next 5 days. 5 tablet 0   traZODone (DESYREL) 100 MG tablet TAKE 1 TABLET BY MOUTH EVERY DAY AT BEDTIME AS NEEDED FOR SLEEP 90 tablet 1   Vitamin D, Ergocalciferol, (DRISDOL) 1.25 MG (50000 UNIT) CAPS capsule Take 1 capsule 1x a week 12 capsule 3   No current facility-administered medications on file prior to visit.    No Known Allergies  Review of Systems No fevers, chills, nausea, muscle aches, no difficulty breathing, no calf pain, no chest pain or shortness of breath.   Physical Exam  GENERAL APPEARANCE: Alert, conversant. Appropriately groomed. No acute distress.   VASCULAR: Pedal pulses palpable DP and PT bilateral.  Capillary refill time is immediate to all digits,  Proximal to distal cooling it warm to warm.  Digital perfusion adequate.   NEUROLOGIC: sensation is intact to 5.07 monofilament at 5/5 sites bilateral.  Light touch is intact bilateral, vibratory sensation intact bilateral.  Some tingling is noted when tapping along the tarsal canal and with tapping along the medial aspect of the plantar fascia.   MUSCULOSKELETAL: acceptable muscle strength, tone and stability bilateral.  No gross boney pedal deformities noted.  Some discomfort with medial to lateral compression of the calcaneus is noted.  Plantar fascia is palpated and feels intact although a small tear could be difficult to appreciate.    DERMATOLOGIC: skin is warm, supple, and dry.  No open lesions noted.  No rash, no pre ulcerative lesions. Digital nails are asymptomatic.      Assessment   1. Nerve entrapment of lower limb, right   2. Tarsal tunnel syndrome  of right side   3. Plantar fasciitis, right   4. Nontraumatic tear of plantar fascia      Plan  - discussed exam and prior studies and treatments with the patient. -  I agree that this could be a nerve entrapment of potentially the posterior tibial nerve vs higher level/ back issue.  She is scheduled for a nerve study of which I feel will help piece her diagnosis together a bit more. - I discussed that if it is a plantar fascial tear, it will take time for this to heal and the PRP is also beneficial for this - I also discussed if it is a nerve entrapment at the ankle/foot level, surgical options may be available. - I recommended she proceed with the nerve conduction study and to call our office to see Dr. Milinda Pointer when she has the results for further follow up.

## 2021-03-20 ENCOUNTER — Ambulatory Visit: Payer: No Typology Code available for payment source | Admitting: Family Medicine

## 2021-03-22 ENCOUNTER — Other Ambulatory Visit: Payer: Self-pay

## 2021-03-22 ENCOUNTER — Ambulatory Visit (INDEPENDENT_AMBULATORY_CARE_PROVIDER_SITE_OTHER): Payer: No Typology Code available for payment source | Admitting: Neurology

## 2021-03-22 ENCOUNTER — Encounter (HOSPITAL_COMMUNITY): Payer: No Typology Code available for payment source

## 2021-03-22 DIAGNOSIS — R202 Paresthesia of skin: Secondary | ICD-10-CM | POA: Diagnosis not present

## 2021-03-22 NOTE — Procedures (Signed)
[facility]  [address]  [place], [place] [address] Tel: [phone] Fax:  [phone] Test Date:  03/22/2021  Patient: Elizabeth Meyer DOB: [birth year] Physician: Narda Amber, DO  Sex: Female Height: 5\' 2"  Ref Phys: Hulan Saas, DO  ID#: [id number]   Technician:    Patient Complaints: This is a 60 year old female referred for evaluation of right foot pain.  NCV & EMG Findings: Extensive electrodiagnostic testing of the right lower extremity shows:  Right sural, superficial peroneal, medial plantar, and lateral plantar sensory responses are within normal limits. Right peroneal and tibial motor responses are within normal limits. Right tibial H reflex study is within normal limits. There is no evidence of active or chronic motor axonal loss changes affecting any of the tested muscles.  Motor unit configuration and recruitment pattern is within normal limits.  Impression: This is a normal study of the right lower extremity.  In particular, there is no evidence of tarsal tunnel syndrome, sensorimotor polyneuropathy, or lumbosacral radiculopathy.   ___________________________ Narda Amber, DO    Nerve Conduction Studies Anti Sensory Summary Table   Stim Site NR Peak (ms) Norm Peak (ms) P-T Amp (V) Norm P-T Amp  Right Sup Peroneal Anti Sensory (Ant Lat Mall)  34C  12 cm    2.3 <4.6 12.8 >4  Right Sural Anti Sensory (Lat Mall)  34C  Calf    2.4 <4.6 24.9 >4   Motor Summary Table   Stim Site NR Onset (ms) Norm Onset (ms) O-P Amp (mV) Norm O-P Amp Site1 Site2 Delta-0 (ms) Dist (cm) Vel (m/s) Norm Vel (m/s)  Right Peroneal Motor (Ext Dig Brev)  34C  Ankle    2.9 <6.0 6.4 >2.5 B Fib Ankle 6.2 32.0 52 >40  B Fib    9.1  5.7  Poplt B Fib 1.3 7.0 54 >40  Poplt    10.4  5.5         Right Tibial Motor (Abd Hall Brev)  34C  Ankle    2.8 <6.0 15.3 >4 Knee Ankle 7.7 39.0 51 >40  Knee    10.5  11.6         Right Tibial (ADQP) Motor (ADQP)  34C   Ankle    4.5 <6.5 6.2 >3 Knee Ankle 7.6 37.0 49 >40  Knee    12.1  3.8          Mixed Summary Table   Stim Site NR Peak (ms) Norm Peak (ms) P-T Amp (V) Norm P-T Amp  Right Lateral Plantar Mixed (Med Malleolus)  34C  Lateral Foot    2.4 <3.7 11.5 >8  Right Medial Plantar Mixed (Med Malleolus)  34C  Medial Foot    2.8 <3.7 11.0 >8   H Reflex Studies   NR H-Lat (ms) Lat Norm (ms) L-R H-Lat (ms)  Right Tibial (Gastroc)  34C     31.43 <35    EMG   Side Muscle Ins Act Fibs Psw Fasc Number Recrt Dur Dur. Amp Amp. Poly Poly. Comment  Right AntTibialis Nml Nml Nml Nml Nml Nml Nml Nml Nml Nml Nml Nml N/A  Right Gastroc Nml Nml Nml Nml Nml Nml Nml Nml Nml Nml Nml Nml N/A  Right Flex Dig Long Nml Nml Nml Nml Nml Nml Nml Nml Nml Nml Nml Nml N/A  Right AbdHallucis Nml Nml Nml Nml Nml Nml Nml Nml Nml Nml Nml Nml N/A  Right RectFemoris Nml Nml Nml Nml Nml Nml Nml Nml Nml Nml  Nml Nml N/A      Waveforms:

## 2021-03-23 ENCOUNTER — Encounter: Payer: Self-pay | Admitting: Family Medicine

## 2021-03-28 NOTE — Progress Notes (Signed)
[place] Phone: [phone] Subjective:   [name], am serving as a scribe for Dr. Hulan Saas. This visit occurred during the SARS-CoV-2 public health emergency.  Safety protocols were in place, including screening questions prior to the visit, additional usage of staff PPE, and extensive cleaning of exam room while observing appropriate contact time as indicated for disinfecting solutions.  I'm seeing this patient by the request  of:  [name], DO  CC: Right foot pain  [id number]  03/08/2021 Patient had the PRP injection previously.  Patient has not made significant improvement.  On ultrasound today does appear to have some scar tissue formation that does show some healing interval.  Patient does have some dilation of the posterior tibial nerve that is more contributing potentially at this point and we discussed why patient is having more posterior capsule pain.  In addition to this patient does have some very mild narrowing of the tarsal tunnel.  Patient is having pain on both sides of the calcaneus so we will get repeat x-rays to further evaluate.  Patient has had lumbar radiculopathy previously as well and will get a nerve conduction test to make sure that this is not secondary to her back.  Prednisone given and increase gabapentin.  Heel lift given for the shoe.  If continuing to have difficulty would consider the possibility of laboratory work-up as well.  ABIs and Doppler ordered today as well secondary to the mild dilation noted of the posterior tibialis vein.  Update 03/29/2021 Elizabeth Meyer is a 60 y.o. female coming in with complaint of R foot pain. Hx of tarsal tunnel and plantar fasciitis. Plantar fascia injection in September 2022 and PRP in November 2022. Patient continues to have pain over plantar aspect of calcaneous. Throbbing pain when seated after being on her feet a lot. Standing  increases her pain. Prednisone is no longer working for her pain.       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   Degenerative arthritis of knee, bilateral 05/06/2019   Elevated LDL cholesterol level 06/17/2017   Endometrial polyp    Essential hypertension 01/29/2019   Heart murmur Dr Johnsie Cancel   dx 2012--  Benign--  per echo abnormal mitral inflow   History of gastrointestinal ulcer    Hyperparathyroidism, primary ([place]) 01/11/2018   IBS (irritable bowel syndrome)    Incomplete right bundle branch block 04/17/2020   Joint swelling 02/11/2019   Loss of transverse plantar arch 07/27/2014   Low back pain 09/09/2017   Lower extremity edema 02/11/2019   Nasal congestion 04/17/2020   Nonallopathic lesion of cervical region 06/10/2018   Nonallopathic lesion of lumbosacral region 06/10/2018   Nonallopathic lesion of rib cage 06/10/2018   Nonallopathic lesion of sacral region 06/10/2018   Nonallopathic lesion of thoracic region 06/10/2018   Palpitations 04/17/2020   Patellofemoral syndrome of both knees 12/19/2016   Inj 10/30 bilateral Bilateral injection September 09, 2017 repeat injections in March 17, 2018 August 06, 2018 Durolane approved 01/15/2019.  Injected January 28, 2019   Pharyngitis 06/21/2014   Plantar fasciitis, right 07/27/2014   Post-menopausal 06/17/2017   Seasonal allergies    Sinusitis, acute frontal 06/03/2014   Suprapubic pain 05/30/2017   Wears contact lenses  Past Surgical History:  Procedure Laterality Date   CARPAL TUNNEL RELEASE Left 05/15/2017   Procedure: LEFT CARPAL TUNNEL RELEASE;  Surgeon: [name], MD;  Location: [place];  Service: Orthopedics;  Laterality: Left;   COLONOSCOPY     COLONOSCOPY WITH PROPOFOL  last one 04-28-2014   CYSTOSCOPY N/A 06/06/2014   Procedure: CYSTOSCOPY;  Surgeon: [name],  MD;  Location: [facility];  Service: Gynecology;  Laterality: N/A;   DILATATION & CURRETTAGE/HYSTEROSCOPY WITH RESECTOCOPE N/A 06/06/2014   Procedure: DILATATION & CURETTAGE/HYSTEROSCOPY WITH RESECTOCOPE;  Surgeon: [name], MD;  Location: [place];  Service: Gynecology;  Laterality: N/A;   LAPAROSCOPIC SALPINGO OOPHERECTOMY Left 06/06/2014   Procedure: LAPAROSCOPIC LEFT SALPINGO OOPHORECTOMY;  Surgeon: [name], MD;  Location: [place];  Service: Gynecology;  Laterality: Left;   PARATHYROIDECTOMY Right 01/13/2018   Procedure: RIGHT SUPERIOR PARATHYROIDECTOMY;  Surgeon: Armandina Gemma, MD;  Location: [facility];  Service: General;  Laterality: Right;   PLANTAR FASCIA SURGERY Left 2009   topaz procedure   TRANSTHORACIC ECHOCARDIOGRAM  08-12-2007   normal LV,  ef 65-70%,  abnormal mitral inflow with trivial MR,  trivial PR and TR   Social History   Socioeconomic History   Marital status: Married    Spouse name: Not on file   Number of children: 2   Years of education: Not on file   Highest education level: Not on file  Occupational History   Occupation: preschool teacher    Employer: [employer]  Tobacco Use   Smoking status: Never   Smokeless tobacco: Never  Vaping Use   Vaping Use: Never used  Substance and Sexual Activity   Alcohol use: Yes    Comment: occasional   Drug use: Never   Sexual activity: Yes    Birth control/protection: Post-menopausal  Other Topics Concern   Not on file  Social History Narrative   Married, Aeronautical engineer 2 daughters   1-2 caffeinated beverages/day   Social Determinants of Health   Financial Resource Strain: Not on file  Food Insecurity: Not on file  Transportation Needs: Not on file  Physical Activity: Not on file  Stress: Not on file  Social Connections: Not on file   No Known Allergies Family History  Problem Relation Age of Onset   Allergic rhinitis Mother    Glaucoma Mother     Hypertension Mother    Hyperlipidemia Mother    Heart disease Mother    Arthritis-Osteo Father    Tuberculosis Maternal Grandfather    Diabetes Maternal Grandfather    Asthma Paternal Grandmother    Dementia Paternal Grandmother    Heart disease Paternal Grandfather    Tuberculosis Other    Colon cancer Neg Hx    Stomach cancer Neg Hx    Esophageal cancer Neg Hx    Pancreatic cancer Neg Hx     Current Outpatient Medications (Endocrine & Metabolic):    predniSONE (DELTASONE) 50 MG tablet, Take one tablet daily for the next 5 days.  Current Outpatient Medications (Cardiovascular):    lisinopril-hydrochlorothiazide (ZESTORETIC) 10-12.5 MG tablet, TAKE 1 TABLET BY MOUTH EVERY DAY  Current Outpatient Medications (Respiratory):    cetirizine (ZYRTEC) 10 MG tablet, Take 1 tablet (10 mg total) by mouth 2 (two) times daily as needed for allergies (Can use an extra dose during flare ups).   montelukast (SINGULAIR) 10 MG tablet, Take 1 tablet (10 mg total) by mouth at bedtime.   Olopatadine-Mometasone (RYALTRIS)  161-09 MCG/ACT SUSP, Place 2 sprays into the nose daily.   oxymetazoline (AFRIN NASAL SPRAY) 0.05 % nasal spray, Place 1 spray into both nostrils 2 (two) times daily as needed for congestion (Use for five days at a time).  Current Outpatient Medications (Analgesics):    meloxicam (MOBIC) 15 MG tablet, TAKE 1 TABLET (15 MG TOTAL) BY MOUTH DAILY.   naproxen sodium (ALEVE) 220 MG tablet, Take 220 mg by mouth daily as needed (Joint pain).   Current Outpatient Medications (Other):    clotrimazole-betamethasone (LOTRISONE) cream, Apply 1 application topically 2 (two) times daily. For 2 weeks   famotidine (PEPCID) 40 MG tablet, Take 1 tablet (40 mg total) by mouth daily.   gabapentin (NEURONTIN) 100 MG capsule, TAKE 2 CAPSULES (200 MG TOTAL) BY MOUTH AT BEDTIME.   Melatonin 5 MG CAPS, Take by mouth.   potassium chloride (KLOR-CON) 10 MEQ tablet, TAKE 2 TABLETS BY MOUTH EVERY DAY    traZODone (DESYREL) 100 MG tablet, TAKE 1 TABLET BY MOUTH EVERY DAY AT BEDTIME AS NEEDED FOR SLEEP   Vitamin D, Ergocalciferol, (DRISDOL) 1.25 MG (50000 UNIT) CAPS capsule, Take 1 capsule 1x a week   Reviewed prior external information including notes and imaging from  primary care provider As well as notes that were available from care everywhere and other healthcare systems.  Past medical history, social, surgical and family history all reviewed in electronic medical record.  No pertanent information unless stated regarding to the chief complaint.   Review of Systems:  No headache, visual changes, nausea, vomiting, diarrhea, constipation, dizziness, abdominal pain, skin rash, fevers, chills, night sweats, weight loss, swollen lymph nodes, body aches, joint swelling, chest pain, shortness of breath, mood changes. POSITIVE muscle aches  Objective  Blood pressure (!) 128/92, pulse 81, height 5\' 2"  (1.575 m), weight 147 lb (66.7 kg), last menstrual period 03/30/2016, SpO2 98 %.   General: No apparent distress alert and oriented x3 mood and affect normal, dressed appropriately.  HEENT: Pupils equal, extraocular movements intact  Respiratory: Patient's speak in full sentences and does not appear short of breath  Cardiovascular: No lower extremity edema, non tender, no erythema  Gait normal with good balance and coordination.  MSK: Right foot exam still shows the patient is tender to palpation over the medial calcaneal area.  Patient also has some tightness noted to the posterior cord.  Mild pain over the tarsal tunnel as well.  Procedure: Real-time Ultrasound Guided Injection of the plantar fascia Device: GE Logiq Q7 Ultrasound guided injection is preferred based studies that show increased duration, increased effect, greater accuracy, decreased procedural pain, increased response rate, and decreased cost with ultrasound guided versus blind injection.  Verbal informed consent obtained.   Time-out conducted.  Noted no overlying erythema, induration, or other signs of local infection.  Skin prepped in a sterile fashion.  Local anesthesia: Topical Ethyl chloride.  With sterile technique and under real time ultrasound guidance: With a 25-gauge half inch needle injected on the medial aspect of the calcaneal with 0.5 cc of 0.5% Marcaine and 0.5 cc of Kenalog 40 mg/mL.  Repeated the same initial thing on the lateral band as well.  Total layer of 0.5 cc of 0.5% Marcaine and 0.5 cc of Kenalog 40 mg/mL. Completed without difficulty  Pain immediately resolved suggesting accurate placement of the medication.  Advised to call if fevers/chills, erythema, induration, drainage, or persistent bleeding.  Impression: Technically successful ultrasound guided injection.   Impression and Recommendations:  The above documentation has been reviewed and is accurate and complete Lyndal Pulley, DO

## 2021-03-29 ENCOUNTER — Encounter: Payer: Self-pay | Admitting: Family Medicine

## 2021-03-29 ENCOUNTER — Other Ambulatory Visit: Payer: Self-pay

## 2021-03-29 ENCOUNTER — Ambulatory Visit (INDEPENDENT_AMBULATORY_CARE_PROVIDER_SITE_OTHER): Payer: No Typology Code available for payment source | Admitting: Family Medicine

## 2021-03-29 ENCOUNTER — Ambulatory Visit: Payer: Self-pay

## 2021-03-29 VITALS — BP 128/92 | HR 81 | Ht 62.0 in | Wt 147.0 lb

## 2021-03-29 DIAGNOSIS — M79671 Pain in right foot: Secondary | ICD-10-CM | POA: Diagnosis not present

## 2021-03-29 DIAGNOSIS — M722 Plantar fascial fibromatosis: Secondary | ICD-10-CM

## 2021-03-29 NOTE — Patient Instructions (Signed)
Great to see you Check back in 6 weeks

## 2021-03-29 NOTE — Assessment & Plan Note (Signed)
Gave patient injections of both plantar fascia along the medial and longitudinal aspects.  Hoping that this will be beneficial.  Patient has failed all other conservative therapy.  Concerned that there may need the possibility of surgery if this does not make a difference.  We did do a block of the posterior tibialis nerve as well to see how patient responds to this in case tarsal tunnel is playing a role.  Follow-up again in 6 to 8 weeks

## 2021-04-05 ENCOUNTER — Encounter: Payer: Self-pay | Admitting: Family Medicine

## 2021-04-08 ENCOUNTER — Other Ambulatory Visit: Payer: Self-pay | Admitting: Family Medicine

## 2021-04-10 ENCOUNTER — Encounter: Payer: No Typology Code available for payment source | Admitting: Neurology

## 2021-05-09 NOTE — Progress Notes (Signed)
[place] Phone: [phone] Subjective:   Elizabeth Meyer, am serving as a scribe for Dr. Hulan Saas. This visit occurred during the SARS-CoV-2 public health emergency.  Safety protocols were in place, including screening questions prior to the visit, additional usage of staff PPE, and extensive cleaning of exam room while observing appropriate contact time as indicated for disinfecting solutions.  I'm seeing this patient by the request  of:  [name], DO  CC: Right foot pain follow-up  [id number]  03/29/2021 Gave patient injections of both plantar fascia along the medial and longitudinal aspects.  Hoping that this will be beneficial.  Patient has failed all other conservative therapy.  Concerned that there may need the possibility of surgery if this does not make a difference.  We did do a block of the posterior tibialis nerve as well to see how patient responds to this in case tarsal tunnel is playing a role.  Follow-up again in 6 to 8 weeks  Update 05/10/2021 Elizabeth Meyer is a 61 y.o. female coming in with complaint of R foot pain.  Patient continued to have significant discomfort and we did do plantar fascial injections on the medial and longitudinal aspects.  Patient originally did 1 week after the injection feels significantly better.  Now has been 6 weeks since we have done the injection patient states that she continues to have pain. Injections gave her a very short period of relief. Pain becomes worse as day progresses.       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   Degenerative arthritis of knee, bilateral 05/06/2019   Elevated LDL cholesterol level 06/17/2017   Endometrial polyp    Essential hypertension 01/29/2019   Heart  murmur Dr Johnsie Cancel   dx 2012--  Benign--  per echo abnormal mitral inflow   History of gastrointestinal ulcer    Hyperparathyroidism, primary ([place]) 01/11/2018   IBS (irritable bowel syndrome)    Incomplete right bundle branch block 04/17/2020   Joint swelling 02/11/2019   Loss of transverse plantar arch 07/27/2014   Low back pain 09/09/2017   Lower extremity edema 02/11/2019   Nasal congestion 04/17/2020   Nonallopathic lesion of cervical region 06/10/2018   Nonallopathic lesion of lumbosacral region 06/10/2018   Nonallopathic lesion of rib cage 06/10/2018   Nonallopathic lesion of sacral region 06/10/2018   Nonallopathic lesion of thoracic region 06/10/2018   Palpitations 04/17/2020   Patellofemoral syndrome of both knees 12/19/2016   Inj 10/30 bilateral Bilateral injection September 09, 2017 repeat injections in March 17, 2018 August 06, 2018 Durolane approved 01/15/2019.  Injected January 28, 2019   Pharyngitis 06/21/2014   Plantar fasciitis, right 07/27/2014   Post-menopausal 06/17/2017   Seasonal allergies    Sinusitis, acute frontal 06/03/2014   Suprapubic pain 05/30/2017   Wears contact lenses    Past Surgical History:  Procedure Laterality Date   CARPAL TUNNEL RELEASE Left 05/15/2017   Procedure: LEFT CARPAL TUNNEL RELEASE;  Surgeon: [name], MD;  Location: [place];  Service: Orthopedics;  Laterality: Left;   COLONOSCOPY     COLONOSCOPY WITH PROPOFOL  last one 04-28-2014   CYSTOSCOPY N/A 06/06/2014   Procedure: CYSTOSCOPY;  Surgeon: [name], MD;  [facility];  Service: Gynecology;  Laterality: N/A;   DILATATION & CURRETTAGE/HYSTEROSCOPY WITH RESECTOCOPE N/A 06/06/2014   Procedure: DILATATION & CURETTAGE/HYSTEROSCOPY WITH RESECTOCOPE;  Surgeon: [name], MD;  Location: [place];  Service: Gynecology;  Laterality: N/A;   LAPAROSCOPIC SALPINGO OOPHERECTOMY Left 06/06/2014   Procedure: LAPAROSCOPIC LEFT SALPINGO OOPHORECTOMY;  Surgeon:  [name], MD;  Location: [place];  Service: Gynecology;  Laterality: Left;   PARATHYROIDECTOMY Right 01/13/2018   Procedure: RIGHT SUPERIOR PARATHYROIDECTOMY;  Surgeon: Armandina Gemma, MD;  Location: [facility];  Service: General;  Laterality: Right;   PLANTAR FASCIA SURGERY Left 2009   topaz procedure   TRANSTHORACIC ECHOCARDIOGRAM  08-12-2007   normal LV,  ef 65-70%,  abnormal mitral inflow with trivial MR,  trivial PR and TR   Social History   Socioeconomic History   Marital status: Married    Spouse name: Not on file   Number of children: 2   Years of education: Not on file   Highest education level: Not on file  Occupational History   Occupation: preschool teacher    Employer: [employer]  Tobacco Use   Smoking status: Never   Smokeless tobacco: Never  Vaping Use   Vaping Use: Never used  Substance and Sexual Activity   Alcohol use: Yes    Comment: occasional   Drug use: Never   Sexual activity: Yes    Birth control/protection: Post-menopausal  Other Topics Concern   Not on file  Social History Narrative   Married, Aeronautical engineer 2 daughters   1-2 caffeinated beverages/day   Social Determinants of Health   Financial Resource Strain: Not on file  Food Insecurity: Not on file  Transportation Needs: Not on file  Physical Activity: Not on file  Stress: Not on file  Social Connections: Not on file   Meyer Known Allergies Family History  Problem Relation Age of Onset   Allergic rhinitis Mother    Glaucoma Mother    Hypertension Mother    Hyperlipidemia Mother    Heart disease Mother    Arthritis-Osteo Father    Tuberculosis Maternal Grandfather    Diabetes Maternal Grandfather    Asthma Paternal Grandmother    Dementia Paternal Grandmother    Heart disease Paternal Grandfather    Tuberculosis Other    Colon cancer Neg Hx    Stomach cancer Neg Hx    Esophageal cancer Neg Hx    Pancreatic cancer Neg Hx     Current Outpatient Medications  (Endocrine & Metabolic):    predniSONE (DELTASONE) 50 MG tablet, Take one tablet daily for the next 5 days.  Current Outpatient Medications (Cardiovascular):    lisinopril-hydrochlorothiazide (ZESTORETIC) 10-12.5 MG tablet, TAKE 1 TABLET BY MOUTH EVERY DAY  Current Outpatient Medications (Respiratory):    cetirizine (ZYRTEC) 10 MG tablet, Take 1 tablet (10 mg total) by mouth 2 (two) times daily as needed for allergies (Can use an extra dose during flare ups).   montelukast (SINGULAIR) 10 MG tablet, Take 1 tablet (10 mg total) by mouth at bedtime.   Olopatadine-Mometasone (RYALTRIS) 665-25 MCG/ACT SUSP, Place 2 sprays into the nose daily.   oxymetazoline (AFRIN NASAL SPRAY) 0.05 % nasal spray, Place 1 spray into both nostrils 2 (two) times daily as needed for congestion (Use for five days at a time).  Current Outpatient Medications (Analgesics):    meloxicam (MOBIC) 15 MG tablet, TAKE 1 TABLET (15 MG TOTAL) BY MOUTH DAILY.   naproxen sodium (ALEVE) 220 MG tablet, Take 220 mg  by mouth daily as needed (Joint pain).   Current Outpatient Medications (Other):    clotrimazole-betamethasone (LOTRISONE) cream, Apply 1 application topically 2 (two) times daily. For 2 weeks   famotidine (PEPCID) 40 MG tablet, Take 1 tablet (40 mg total) by mouth daily.   gabapentin (NEURONTIN) 100 MG capsule, TAKE 2 CAPSULES (200 MG TOTAL) BY MOUTH AT BEDTIME.   Melatonin 5 MG CAPS, Take by mouth.   potassium chloride (KLOR-CON) 10 MEQ tablet, TAKE 2 TABLETS BY MOUTH EVERY DAY   traZODone (DESYREL) 100 MG tablet, TAKE 1 TABLET BY MOUTH EVERY DAY AT BEDTIME AS NEEDED FOR SLEEP   Vitamin D, Ergocalciferol, (DRISDOL) 1.25 MG (50000 UNIT) CAPS capsule, Take 1 capsule 1x a week   Reviewed prior external information including notes and imaging from  primary care provider As well as notes that were available from care everywhere and other healthcare systems.  Past medical history, social, surgical and family history all  reviewed in electronic medical record.  Meyer pertanent information unless stated regarding to the chief complaint.   Review of Systems:  Meyer headache, visual changes, nausea, vomiting, diarrhea, constipation, dizziness, abdominal pain, skin rash, fevers, chills, night sweats, weight loss, swollen lymph nodes, body aches, joint swelling, chest pain, shortness of breath, mood changes. POSITIVE muscle aches  Objective  Blood pressure (!) 158/100, pulse 85, height 5\' 2"  (1.575 m), last menstrual period 03/30/2016, SpO2 99 %.   General: Meyer apparent distress alert and oriented x3 mood and affect normal, dressed appropriately.  HEENT: Pupils equal, extraocular movements intact  Respiratory: Patient's speak in full sentences and does not appear short of breath  Cardiovascular: Meyer lower extremity edema, non tender, Meyer erythema  Gait normal with good balance and coordination.  MSK: Exam shows the patient does not have any significant breakdown of any of the arches.  Still tender to palpation on the plantar aspect of the foot.  Patient neurovascular intact distally.  Very mild positive Tinel's.  Limited muscular skeletal ultrasound was performed and interpreted by Hulan Saas, M  Limited ultrasound shows that there is significant decrease in hypoechoic changes along the plantar fascia at this point.  Meyer true acute tearing appreciated anywhere.  Meyer swelling of the tarsal tunnel.  Mild calcific changes.  90 retrocalcaneal bursa but otherwise unremarkable plantar heel spur noted Impression: Plantar heel spur but otherwise fairly unremarkable   Impression and Recommendations:     The above documentation has been reviewed and is accurate and complete Lyndal Pulley, DO

## 2021-05-10 ENCOUNTER — Other Ambulatory Visit: Payer: Self-pay

## 2021-05-10 ENCOUNTER — Ambulatory Visit: Payer: Self-pay

## 2021-05-10 ENCOUNTER — Ambulatory Visit (INDEPENDENT_AMBULATORY_CARE_PROVIDER_SITE_OTHER): Payer: 59 | Admitting: Family Medicine

## 2021-05-10 ENCOUNTER — Encounter: Payer: Self-pay | Admitting: Family Medicine

## 2021-05-10 VITALS — BP 124/90 | HR 85 | Ht 62.0 in

## 2021-05-10 DIAGNOSIS — M79671 Pain in right foot: Secondary | ICD-10-CM | POA: Diagnosis not present

## 2021-05-10 DIAGNOSIS — M722 Plantar fascial fibromatosis: Secondary | ICD-10-CM | POA: Diagnosis not present

## 2021-05-10 NOTE — Assessment & Plan Note (Signed)
Continue pain. Had tear on MRI and tried injection, PRP, HEP, physical therapy.  Patient has had normal ABI and nerve conduction study. Patient at this point is failed all conservative therapy.  We will send patient to orthopedic surgery to discuss the possibility of surgical intervention possibly for the plantar fascial tears as well as the plantar calcaneal spur that seems to be the only thing else that is contributing with process of illumination.

## 2021-05-10 NOTE — Patient Instructions (Signed)
Referral to Dr. Lucia Gaskins

## 2021-05-11 ENCOUNTER — Other Ambulatory Visit: Payer: Self-pay | Admitting: Family Medicine

## 2021-05-11 ENCOUNTER — Other Ambulatory Visit: Payer: Self-pay | Admitting: Internal Medicine

## 2021-05-11 DIAGNOSIS — E559 Vitamin D deficiency, unspecified: Secondary | ICD-10-CM

## 2021-05-11 DIAGNOSIS — E039 Hypothyroidism, unspecified: Secondary | ICD-10-CM

## 2021-05-24 ENCOUNTER — Ambulatory Visit: Payer: No Typology Code available for payment source | Admitting: Internal Medicine

## 2021-05-29 ENCOUNTER — Encounter: Payer: Self-pay | Admitting: Allergy and Immunology

## 2021-05-29 ENCOUNTER — Other Ambulatory Visit: Payer: Self-pay

## 2021-05-29 ENCOUNTER — Ambulatory Visit (INDEPENDENT_AMBULATORY_CARE_PROVIDER_SITE_OTHER): Payer: 59 | Admitting: Allergy and Immunology

## 2021-05-29 VITALS — BP 122/70 | HR 84 | Temp 98.1°F | Resp 16 | Ht 62.0 in | Wt 146.0 lb

## 2021-05-29 DIAGNOSIS — J3089 Other allergic rhinitis: Secondary | ICD-10-CM | POA: Diagnosis not present

## 2021-05-29 MED ORDER — MONTELUKAST SODIUM 10 MG PO TABS: 10.0000 mg | ORAL_TABLET | Freq: Every day | ORAL | 11 refills | Status: DC

## 2021-05-29 MED ORDER — CETIRIZINE HCL 10 MG PO TABS: 10.0000 mg | ORAL_TABLET | Freq: Two times a day (BID) | ORAL | 1 refills | Status: AC | PRN

## 2021-05-29 NOTE — Patient Instructions (Addendum)
°  1.  Allergen avoidance measures - dust mite, cat, dog, mold  2.  If needed:  A. Montelukast 10 mg - 1 tablet 1 time per day B. Nasal saline C. Cetirizine 10 mg - 1 tablet 1 time per day  3. Further evaluation and treatment???

## 2021-05-29 NOTE — Progress Notes (Signed)
[place]   Follow-up Note  Referring Provider: [name], * Primary Provider: Carollee Herter, Alferd Apa, DO Date of Office Visit: 05/29/2021  Subjective:   Elizabeth Meyer (DOB: July 19, 1960) is a 61 y.o. female who returns to the [facility] on 05/29/2021 in re-evaluation of the following:  HPI: Arloa returns to this clinic in evaluation of allergic rhinitis, rhinitis medicamentosa, reflux, sleep dysfunction, and history of glaucoma.  Her last visit to this clinic was her initial evaluation of 06 March 2021.  She is really doing wonderful at this point in time.  Now that her dog has passed away and she has no dog exposure she has basically resolved all the issues with her eyes and nose.  She has no need to use any nasal decongestant spray.  She is not using any nasal steroid.  She continues on montelukast but is considering discontinuing this agent.  As well, during her last visit she had some issues with sleep dysfunction and this is actually going much better at this point in time.  She has had no issues with reflux.  Allergies as of 05/29/2021   No Known Allergies      Medication List    cetirizine 10 MG tablet Commonly known as: ZYRTEC Take 1 tablet (10 mg total) by mouth 2 (two) times daily as needed for allergies (Can use an extra dose during flare ups).   clotrimazole-betamethasone cream Commonly known as: Lotrisone Apply 1 application topically 2 (two) times daily. For 2 weeks   famotidine 40 MG tablet Commonly known as: PEPCID Take 1 tablet (40 mg total) by mouth daily.   gabapentin 100 MG capsule Commonly known as: NEURONTIN TAKE 2 CAPSULES (200 MG TOTAL) BY MOUTH AT BEDTIME.   lisinopril-hydrochlorothiazide 10-12.5 MG tablet Commonly known as: ZESTORETIC TAKE 1 TABLET BY MOUTH EVERY DAY   Melatonin 5 MG Caps Take by mouth.   meloxicam 15 MG tablet Commonly known as: MOBIC TAKE  1 TABLET (15 MG TOTAL) BY MOUTH DAILY.   montelukast 10 MG tablet Commonly known as: SINGULAIR Take 1 tablet (10 mg total) by mouth at bedtime.   naproxen sodium 220 MG tablet Commonly known as: ALEVE Take 220 mg by mouth daily as needed (Joint pain).   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   Degenerative arthritis of knee, bilateral 05/06/2019   Elevated LDL cholesterol level 06/17/2017   Endometrial polyp    Essential hypertension 01/29/2019   Heart murmur Dr Johnsie Cancel   dx 2012--  Benign--  per echo abnormal mitral inflow   History of gastrointestinal ulcer    Hyperparathyroidism, primary ([place]) 01/11/2018   IBS (irritable bowel syndrome)    Incomplete right bundle branch block 04/17/2020   Joint swelling 02/11/2019   Loss of transverse plantar arch 07/27/2014   Low back pain 09/09/2017   Lower extremity edema 02/11/2019   Nasal congestion 04/17/2020  Nonallopathic lesion of cervical region 06/10/2018   Nonallopathic lesion of lumbosacral region 06/10/2018   Nonallopathic lesion of rib cage 06/10/2018   Nonallopathic lesion of sacral region 06/10/2018   Nonallopathic lesion of thoracic region 06/10/2018   Palpitations 04/17/2020   Patellofemoral syndrome of both knees 12/19/2016   Inj 10/30 bilateral Bilateral injection September 09, 2017 repeat injections in March 17, 2018 August 06, 2018 Durolane approved 01/15/2019.  Injected January 28, 2019   Pharyngitis 06/21/2014   Plantar fasciitis, right 07/27/2014    Post-menopausal 06/17/2017   Seasonal allergies    Sinusitis, acute frontal 06/03/2014   Suprapubic pain 05/30/2017   Wears contact lenses     Past Surgical History:  Procedure Laterality Date   CARPAL TUNNEL RELEASE Left 05/15/2017   Procedure: LEFT CARPAL TUNNEL RELEASE;  Surgeon: [name], MD;  Location: [facility];  Service: Orthopedics;  Laterality: Left;   COLONOSCOPY     COLONOSCOPY WITH PROPOFOL  last one 04-28-2014   CYSTOSCOPY N/A 06/06/2014   Procedure: CYSTOSCOPY;  Surgeon: [name], MD;  Location: [facility];  Service: Gynecology;  Laterality: N/A;   DILATATION & CURRETTAGE/HYSTEROSCOPY WITH RESECTOCOPE N/A 06/06/2014   Procedure: DILATATION & CURETTAGE/HYSTEROSCOPY WITH RESECTOCOPE;  Surgeon: [name], MD;  Location: [place];  Service: Gynecology;  Laterality: N/A;   LAPAROSCOPIC SALPINGO OOPHERECTOMY Left 06/06/2014   Procedure: LAPAROSCOPIC LEFT SALPINGO OOPHORECTOMY;  Surgeon: [name], MD;  Location: [place] [facility];  Service: Gynecology;  Laterality: Left;   PARATHYROIDECTOMY Right 01/13/2018   Procedure: RIGHT SUPERIOR PARATHYROIDECTOMY;  Surgeon: Armandina Gemma, MD;  Location: [facility];  Service: General;  Laterality: Right;   PLANTAR FASCIA SURGERY Left 2009   topaz procedure   TRANSTHORACIC ECHOCARDIOGRAM  08-12-2007   normal LV,  ef 65-70%,  abnormal mitral inflow with trivial MR,  trivial PR and TR    Review of systems negative except as noted in HPI / PMHx or noted below:  Review of Systems  Constitutional: Negative.   HENT: Negative.    Eyes: Negative.   Respiratory: Negative.    Cardiovascular: Negative.   Gastrointestinal: Negative.   Genitourinary: Negative.   Musculoskeletal: Negative.   Skin: Negative.   Neurological: Negative.   Endo/Heme/Allergies: Negative.   Psychiatric/Behavioral: Negative.      Objective:   Vitals:   05/29/21 1336  BP: 122/70  Pulse: 84  Resp: 16   Temp: 98.1 F (36.7 C)  SpO2: 97%   Height: 5\' 2"  (157.5 cm)  Weight: 146 lb (66.2 kg)   Physical Exam Constitutional:      Appearance: She is not diaphoretic.  HENT:     Head: Normocephalic.     Right Ear: Tympanic membrane, ear canal and external ear normal.     Left Ear: Tympanic membrane, ear canal and external ear normal.     Nose: Nose normal. No mucosal edema or rhinorrhea.     Mouth/Throat:     Pharynx: Uvula midline. No oropharyngeal exudate.  Eyes:     Conjunctiva/sclera: Conjunctivae normal.  Neck:     Thyroid: No thyromegaly.     Trachea: Trachea normal. No tracheal tenderness or tracheal deviation.  Cardiovascular:     Rate and Rhythm: Normal rate and regular rhythm.     Heart sounds: Normal heart sounds, S1 normal and S2 normal. No murmur heard. Pulmonary:     Effort: No respiratory distress.     Breath sounds: Normal breath sounds. No stridor.  No wheezing or rales.  Lymphadenopathy:     Head:     Right side of head: No tonsillar adenopathy.     Left side of head: No tonsillar adenopathy.     Cervical: No cervical adenopathy.  Skin:    Findings: No erythema or rash.     Nails: There is no clubbing.  Neurological:     Mental Status: She is alert.    Diagnostics: none  Assessment and Plan:   1. Perennial allergic rhinitis    1.  Allergen avoidance measures - dust mite, cat, dog, mold  2.  If needed:  A. Montelukast 10 mg - 1 tablet 1 time per day B. Nasal saline C. Cetirizine 10 mg - 1 tablet 1 time per day  3. Further evaluation and treatment???  I think Shatyra is going to do wonderful with minimal medication requirement as long as she remains away from dog exposure.  She can make a determination about whether or not montelukast and cetirizine give her any benefit by discontinuing these agents.  If she does well with the plan I do not see need for her to return to this clinic on a regular basis.  Certainly if she does not do well with this plan  she can contact us for further evaluation and treatment.  Allena Katz, MD Allergy / Immunology [place]

## 2021-05-30 ENCOUNTER — Encounter: Payer: Self-pay | Admitting: Allergy and Immunology

## 2021-05-31 ENCOUNTER — Other Ambulatory Visit: Payer: Self-pay

## 2021-05-31 ENCOUNTER — Encounter: Payer: Self-pay | Admitting: Internal Medicine

## 2021-05-31 ENCOUNTER — Ambulatory Visit (INDEPENDENT_AMBULATORY_CARE_PROVIDER_SITE_OTHER): Payer: 59 | Admitting: Internal Medicine

## 2021-05-31 ENCOUNTER — Encounter: Payer: Self-pay | Admitting: Family Medicine

## 2021-05-31 VITALS — BP 120/74 | HR 79 | Ht 62.0 in | Wt 147.0 lb

## 2021-05-31 DIAGNOSIS — E210 Primary hyperparathyroidism: Secondary | ICD-10-CM

## 2021-05-31 DIAGNOSIS — M8580 Other specified disorders of bone density and structure, unspecified site: Secondary | ICD-10-CM

## 2021-05-31 DIAGNOSIS — E559 Vitamin D deficiency, unspecified: Secondary | ICD-10-CM

## 2021-05-31 LAB — CALCIUM: Calcium: 9.2 mg/dL (ref 8.4–10.5)

## 2021-05-31 LAB — VITAMIN D 25 HYDROXY (VIT D DEFICIENCY, FRACTURES): VITD: 48.4 ng/mL (ref 30.00–100.00)

## 2021-05-31 NOTE — Progress Notes (Signed)
Patient ID: Elizabeth Meyer, female   DOB: 1960-04-22, 61 y.o.   MRN: [id number]   This visit occurred during the SARS-CoV-2 public health emergency.  Safety protocols were in place, including screening questions prior to the visit, additional usage of staff PPE, and extensive cleaning of exam room while observing appropriate contact time as indicated for disinfecting solutions.   HPI  Elizabeth Meyer is a 61 y.o.-year-old female, initially referred by Dr Tamala Julian, returning for follow-up for history of primary hyperparathyroidism, vitamin D deficiency, and osteopenia.  Last visit 1 year ago.  Interim history: She recently developed palpitations and she sees cardiology. These have now improved.  Before last visit, she had palpitations and saw cardiology.  These resolved. She had plantar fasciitis >> tried different therapeutic options without success. She has a tear in the fascia. She will be put in a hard cast next week  - nonweightbearing. She may need surgery. She stopped going to the gym in 09/2020. Also had problems with allergies since last visit, but she found out she was allergic to her dog, who since passed away.  Reviewed history: Patient was found to have an elevated calcium during investigation by Dr. Tamala Julian, whom she was seeing for joint pain (hips, knees).  A PTH level was also found to be high.  She was referred to endocrinology for further investigation.  Reviewed pertinent labs: Lab Results  Component Value Date   PTH 35 06/25/2019   PTH 32 05/25/2018   PTH Comment 05/25/2018   PTH 98 (H) 10/07/2017   PTH 121 (H) 09/09/2017   CALCIUM 9.5 06/19/2020   CALCIUM 8.9 04/04/2020   CALCIUM 9.6 12/16/2019   CALCIUM 9.1 06/25/2019   CALCIUM 8.9 02/11/2019   CALCIUM 9.0 01/29/2019   CALCIUM 9.1 05/25/2018   CALCIUM 10.0 01/09/2018   CALCIUM 10.0 10/07/2017   CALCIUM 10.6 (H) 09/09/2017   CALCIUM 10.2 09/09/2017  Postop, calcium was normal, at 9.4, and PTH greatly decreased, at  30.  She recently had osteoporosis on the latest bone density scan, previous osteopenia:  Lumbar spine L1-L4 (L2) Femoral neck (FN) 33% distal radius frax  09/02/2019 ([facility] -2.4 (-0.1%) RFN: -1.0 LFN: -0.9 -2.5 N/a  07/31/2017 ([facility]) -2.4 (-10.7%*) RFN: -1.3 LFN: -0.8 n/a 10-year fracture risk 5.9% 10-year hip fracture risk: 0.3%  02/24/2014 -1.5 N/a n/a N/a  *Statistically significant difference  The DXA report is not clear in comparison between the hip bone mineral densities between 2019 and 2015.  It looks that the bone density have decreased but I cannot tell whether it is the total hip BMD's that were compared.    She had a high calcitriol level but normal magnesium and phosphorus: Component     Latest Ref Rng & Units 10/07/2017          Vitamin D 1, 25 (OH) Total     18 - 72 pg/mL 84 (H)  Vitamin D3 1, 25 (OH)     pg/mL 41  Vitamin D2 1, 25 (OH)     pg/mL 43  Magnesium     1.5 - 2.5 mg/dL 2.2  Phosphorus     2.3 - 4.6 mg/dL 2.9   24-hour urine calcium was normal but at the upper limit of normal: Component     Latest Ref Rng & Units 10/15/2017  Creatinine, 24H Ur     0.50 - 2.15 g/24 h 0.78  Calcium, 24H Urine     mg/24 h 198  No history of kidney stones.  Thyroid ultrasound (11/13/2017): 1 x 0.9 x 0.6 cm solid/almost completely solid, hypoechoic nodule in the right mid gland, but now sign of parathyroid adenoma.  Indication was for repeat ultrasound in a year.  Technetium sestamibi parathyroid scan (11/13/2017): Was negative for parathyroid adenoma  4D CT of the neck (12/04/2017): No abnormal left-sided finding. On the right, there appears to be relative enlargement of the superior parathyroid gland (marked with double arrows), but this retains a somewhat flattened appearance. This could represent an early adenoma or hyperplasia. Below that on the right, there are 2 small nodular foci at the tracheoesophageal groove which do not show  enhancement on arterial phase and therefore more consistent with small nonpathologic lymph nodes.  She saw Dr. Harlow Asa and had a right superior parathyroidectomy 01/13/2018.   Pathology showed an adenoma of 0.25 g (1.5 x 0.9 x 0.2 cm). Postoperatively, calcium was normal at 9.4 and PTH was greatly decreased, at 30.  No history of CKD. Last BUN/Cr: Lab Results  Component Value Date   BUN 14 06/19/2020   BUN 20 04/04/2020   CREATININE 0.79 06/19/2020   CREATININE 0.75 04/04/2020   She has a history of vitamin D deficiency.  Latest levels were normal: Lab Results  Component Value Date   VD25OH 41.85 04/13/2020   VD25OH 81 12/16/2019   VD25OH 50.94 06/25/2019   VD25OH 33.13 05/25/2018   VD25OH 34.95 09/09/2017   On ergocalciferol 50,000 units weekly.  Latest TSH was normal but she has a history of TSH levels in the low normal range: Lab Results  Component Value Date   TSH 0.68 06/19/2020   No family history of pituitary tumors, thyroid cancer, or osteoporosis.  Her daughter and her mother  have had kidney stones.   She has a history of carpal tunnel surgeries bilaterally. She has back pain-herniated disc. She also has HTN. She has a history of iron deficiency anemia.  ROS: + see HPI Musculoskeletal: + Muscle aches/+ joint aches  I reviewed pt's medications, allergies, PMH, social hx, family hx, and changes were documented in the history of present illness. Otherwise, unchanged from my initial visit note.  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   Degenerative arthritis of knee, bilateral 05/06/2019   Elevated LDL cholesterol level 06/17/2017   Endometrial polyp    Essential hypertension 01/29/2019   Heart murmur Dr Johnsie Cancel   dx 2012--  Benign--  per echo abnormal mitral inflow   History of gastrointestinal ulcer     Hyperparathyroidism, primary ([place]) 01/11/2018   IBS (irritable bowel syndrome)    Incomplete right bundle branch block 04/17/2020   Joint swelling 02/11/2019   Loss of transverse plantar arch 07/27/2014   Low back pain 09/09/2017   Lower extremity edema 02/11/2019   Nasal congestion 04/17/2020   Nonallopathic lesion of cervical region 06/10/2018   Nonallopathic lesion of lumbosacral region 06/10/2018   Nonallopathic lesion of rib cage 06/10/2018   Nonallopathic lesion of sacral region 06/10/2018   Nonallopathic lesion of thoracic region 06/10/2018   Palpitations 04/17/2020   Patellofemoral syndrome of both knees 12/19/2016   Inj 10/30 bilateral Bilateral injection September 09, 2017 repeat injections in March 17, 2018 August 06, 2018 Durolane approved 01/15/2019.  Injected January 28, 2019   Pharyngitis 06/21/2014   Plantar fasciitis, right  07/27/2014   Post-menopausal 06/17/2017   Seasonal allergies    Sinusitis, acute frontal 06/03/2014   Suprapubic pain 05/30/2017   Wears contact lenses    Past Surgical History:  Procedure Laterality Date   CARPAL TUNNEL RELEASE Left 05/15/2017   Procedure: LEFT CARPAL TUNNEL RELEASE;  Surgeon: [name], MD;  Location: [place];  Service: Orthopedics;  Laterality: Left;   COLONOSCOPY     COLONOSCOPY WITH PROPOFOL  last one 04-28-2014   CYSTOSCOPY N/A 06/06/2014   Procedure: CYSTOSCOPY;  Surgeon: [name], MD;  Location: [facility];  Service: Gynecology;  Laterality: N/A;   DILATATION & CURRETTAGE/HYSTEROSCOPY WITH RESECTOCOPE N/A 06/06/2014   Procedure: DILATATION & CURETTAGE/HYSTEROSCOPY WITH RESECTOCOPE;  Surgeon: [name], MD;  Location: [place];  Service: Gynecology;  Laterality: N/A;   LAPAROSCOPIC SALPINGO OOPHERECTOMY Left 06/06/2014   Procedure: LAPAROSCOPIC LEFT SALPINGO OOPHORECTOMY;  Surgeon: [name], MD;  Location: [place];  Service: Gynecology;  Laterality: Left;    PARATHYROIDECTOMY Right 01/13/2018   Procedure: RIGHT SUPERIOR PARATHYROIDECTOMY;  Surgeon: Armandina Gemma, MD;  Location: [facility];  Service: General;  Laterality: Right;   PLANTAR FASCIA SURGERY Left 2009   topaz procedure   TRANSTHORACIC ECHOCARDIOGRAM  08-12-2007   normal LV,  ef 65-70%,  abnormal mitral inflow with trivial MR,  trivial PR and TR   Social History   Socioeconomic History   Marital status: Married    Spouse name: Not on file   Number of children: 2   Years of education: Not on file   Highest education level: Not on file  Occupational History   Occupation: preschool teacher    Employer: [employer]  Tobacco Use   Smoking status: Never   Smokeless tobacco: Never  Vaping Use   Vaping Use: Never used  Substance and Sexual Activity   Alcohol use: Yes    Comment: occasional   Drug use: Never   Sexual activity: Yes    Birth control/protection: Post-menopausal  Other Topics Concern   Not on file  Social History Narrative   Married, Aeronautical engineer 2 daughters   1-2 caffeinated beverages/day   Social Determinants of Health   Financial Resource Strain: Not on file  Food Insecurity: Not on file  Transportation Needs: Not on file  Physical Activity: Not on file  Stress: Not on file  Social Connections: Not on file  Intimate Partner Violence: Not on file   Current Outpatient Medications on File Prior to Visit  Medication Sig Dispense Refill   cetirizine (ZYRTEC) 10 MG tablet Take 1 tablet (10 mg total) by mouth 2 (two) times daily as needed for allergies (Can use an extra dose during flare ups). 180 tablet 1   clotrimazole-betamethasone (LOTRISONE) cream Apply 1 application topically 2 (two) times daily. For 2 weeks 60 g 0   famotidine (PEPCID) 40 MG tablet Take 1 tablet (40 mg total) by mouth daily. 30 tablet 11   gabapentin (NEURONTIN) 100 MG capsule TAKE 2 CAPSULES (200 MG TOTAL) BY MOUTH AT BEDTIME. 180 capsule 2   lisinopril-hydrochlorothiazide (ZESTORETIC)  10-12.5 MG tablet TAKE 1 TABLET BY MOUTH EVERY DAY 90 tablet 1   Melatonin 5 MG CAPS Take by mouth.     meloxicam (MOBIC) 15 MG tablet TAKE 1 TABLET (15 MG TOTAL) BY MOUTH DAILY. 30 tablet 0   montelukast (SINGULAIR) 10 MG tablet Take 1 tablet (10 mg total) by mouth at bedtime. 30 tablet 11   naproxen  sodium (ALEVE) 220 MG tablet Take 220 mg by mouth daily as needed (Joint pain).     potassium chloride (KLOR-CON) 10 MEQ tablet TAKE 2 TABLETS BY MOUTH EVERY DAY 180 tablet 1   traZODone (DESYREL) 100 MG tablet TAKE 1 TABLET BY MOUTH EVERY DAY AT BEDTIME AS NEEDED FOR SLEEP 90 tablet 1   Vitamin D, Ergocalciferol, (DRISDOL) 1.25 MG (50000 UNIT) CAPS capsule TAKE 1 CAPSULE 1X A WEEK 4 capsule 0   No current facility-administered medications on file prior to visit.   No Known Allergies  Family History  Problem Relation Age of Onset   Allergic rhinitis Mother    Glaucoma Mother    Hypertension Mother    Hyperlipidemia Mother    Heart disease Mother    Arthritis-Osteo Father    Tuberculosis Maternal Grandfather    Diabetes Maternal Grandfather    Asthma Paternal Grandmother    Dementia Paternal Grandmother    Heart disease Paternal Grandfather    Tuberculosis Other    Colon cancer Neg Hx    Stomach cancer Neg Hx    Esophageal cancer Neg Hx    Pancreatic cancer Neg Hx     PE: BP 120/74 (BP Location: Right Arm, Patient Position: Sitting, Cuff Size: Normal)    Pulse 79    Ht 5\' 2"  (1.575 m)    Wt 147 lb (66.7 kg)    LMP 03/30/2016    SpO2 97%    BMI 26.89 kg/m  Wt Readings from Last 3 Encounters:  05/31/21 147 lb (66.7 kg)  05/29/21 146 lb (66.2 kg)  03/29/21 147 lb (66.7 kg)   Constitutional: normal weight, in NAD Eyes: PERRLA, EOMI, no exophthalmos ENT: moist mucous membranes, no thyromegaly, no cervical lymphadenopathy Cardiovascular: RRR, No MRG Respiratory: CTA B Musculoskeletal: no deformities, strength intact in all 4 Skin: moist, warm, no rashes Neurological: no tremor  with outstretched hands, DTR normal in all 4  Assessment: 1. Hypercalcemia/hyperparathyroidism  2.  History of vitamin D deficiency  3.  Osteopenia  4.  Thyroid nodule  Plan: Patient with history of slightly elevated calcium and also elevated PTH level.  High ACTH was 121.  She also had a history of vitamin D deficiency and at last visit was on replacement with ergocalciferol weekly.  Latest level was reviewed and was normal. -She has no apparent complications from hypercalcemia: No history of nephrolithiasis, no osteoporosis (however, she does have osteopenia), no fractures.  She denied abdominal pain, depression, bone pain.  She did have joint pains for which she was seen Dr. Tamala Julian. -She had extensive investigation with neck ultrasound, parathyroid scan and 4D CT and the last test finally revealed a parathyroid adenoma -She had parathyroidectomy on 01/13/2018 and this was successful in extracting the 0.25 g right superior adenomatous parathyroid -Calcium and PTH obtained after surgery were normal.  We will recheck a calcium today.  2.  History of vitamin D deficiency -On ergocalciferol weekly -Latest vitamin D level was normal in 04/2020: 41.85 -We will recheck the vitamin D level now  3.  Osteoporosis -Based on the T score at the level of the 33% distal radius -Unfortunately, I do not have a previous T score to compare -I again suggested weight bearing exercises and gave her the NOF recommended exercise regimen.  She had back pain with herniated the disc and sciatica at last visit along so she was not exercising but these resolved. -She is due for another bone density scan at [facility] later  this spring  4.  Thyroid nodule -No neck compression symptoms -TSH was normal at last check in 03/2020 -Thyroid ultrasound report was reviewed from 08/2018, and this showed a small nodule, not worrisome -No follow-up needed for this unless she develops neck compression symptoms or  other masses felt on palpation  Needs refills ergocalciferol.  Component     Latest Ref Rng & Units 05/31/2021          VITD     30.00 - 100.00 ng/mL 48.40  Calcium     8.4 - 10.5 mg/dL 9.2  Vitamin D and calcium levels are normal.  Philemon Kingdom, MD PhD [facility] Endocrinology

## 2021-05-31 NOTE — Patient Instructions (Signed)
Please continue vitamin D 50,000 units daily  Please ask your OB/GYN doctor to order another bone density scan.  Please stop at the lab.  Please come back for a follow-up appointment in 1 year.

## 2021-06-01 ENCOUNTER — Encounter: Payer: Self-pay | Admitting: Internal Medicine

## 2021-06-01 DIAGNOSIS — G4733 Obstructive sleep apnea (adult) (pediatric): Secondary | ICD-10-CM

## 2021-06-01 MED ORDER — VITAMIN D (ERGOCALCIFEROL) 1.25 MG (50000 UNIT) PO CAPS: ORAL_CAPSULE | ORAL | 3 refills | Status: DC

## 2021-06-01 NOTE — Telephone Encounter (Signed)
Order- please dc CPAP per her request. Glad she is doing better.  We can see her again as needed.

## 2021-06-01 NOTE — Telephone Encounter (Signed)
Dr. Annamaria Boots please advise on the following My Chart message:   Hi.  I have been so much better since I was in your office.  I had ordered cpap machine but never used because I am sleeping much better, snoring minimized.  I was getting better around October - I thought it was singular that was making difference but already had appointment with allergy dr. that you had suggested I see.  I found out early December that I was highly allergic to dogs and my dog passed away 2023-01-17 and therefore I saw a big difference with my breathing/sleeping by time I saw allergist.   I assume I developed allergy within last couple years to our dog because he was 42 1/2 years old.  I would like to return Cpap machine but I need letter from you so I can return to [place].   Thank you  Elizabeth Meyer  Thank you

## 2021-06-04 ENCOUNTER — Other Ambulatory Visit: Payer: Self-pay | Admitting: Internal Medicine

## 2021-06-04 DIAGNOSIS — E559 Vitamin D deficiency, unspecified: Secondary | ICD-10-CM

## 2021-07-10 ENCOUNTER — Encounter: Payer: Self-pay | Admitting: Family Medicine

## 2021-07-18 NOTE — Progress Notes (Signed)
?Charlann Boxer D.O. ?[facility] ?[place] ?Phone: [phone] ?Subjective:   ?I, Judy Pimple, am serving as a scribe for Dr. Hulan Saas. ? ?I'm seeing this patient by the request  of:  [name], DO ? ?CC: Left foot and ankle pain follow-up ? ?ION:[id number]  ?05/10/2021 ?Continue pain. Had tear on MRI and tried injection, PRP, HEP, physical therapy.  Patient has had normal ABI and nerve conduction study. ?Patient at this point is failed all conservative therapy.  We will send patient to orthopedic surgery to discuss the possibility of surgical intervention possibly for the plantar fascial tears as well as the plantar calcaneal spur that seems to be the only thing else that is contributing with process of illumination. ?  ?Update 07/23/2021 ?MADYLYN INSCO is a 61 y.o. female coming in with complaint of R foot pain. Dr. Lucia Gaskins placed patient in cast. Patient would like [place] today to note healing. Patient states that the foot has felt the same, but wants to see if there is any progress from being in a cast/non weight bearing.  ? ? ?  ? ?Past Medical History:  ?Diagnosis Date  ? Acquired hypothyroidism 06/17/2017  ? Allergic dermatitis 09/16/2015  ? Complex cyst of left ovary   ? Cough 05/30/2017  ? 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 Degenerative arthritis of knee, bilateral 05/06/2019  ? Elevated LDL cholesterol level 06/17/2017  ? Endometrial polyp   ? Essential hypertension 01/29/2019  ? Heart murmur Dr Johnsie Cancel  ? dx 2012--  Benign--  per echo abnormal mitral inflow  ? History of gastrointestinal ulcer   ? Hyperparathyroidism, primary ([place]) 01/11/2018  ? IBS (irritable bowel syndrome)   ? Incomplete right bundle branch block 04/17/2020  ? Joint swelling 02/11/2019  ? Loss of transverse plantar arch 07/27/2014  ? Low back pain 09/09/2017  ? Lower extremity edema 02/11/2019  ? Nasal congestion 04/17/2020  ? Nonallopathic lesion of cervical region 06/10/2018  ?  Nonallopathic lesion of lumbosacral region 06/10/2018  ? Nonallopathic lesion of rib cage 06/10/2018  ? Nonallopathic lesion of sacral region 06/10/2018  ? Nonallopathic lesion of thoracic region 06/10/2018  ? Palpitations 04/17/2020  ? Patellofemoral syndrome of both knees 12/19/2016  ? Inj 10/30 bilateral Bilateral injection September 09, 2017 repeat injections in March 17, 2018 August 06, 2018 Durolane approved 01/15/2019.  Injected January 28, 2019  ? Pharyngitis 06/21/2014  ? Plantar fasciitis, right 07/27/2014  ? Post-menopausal 06/17/2017  ? Seasonal allergies   ? Sinusitis, acute frontal 06/03/2014  ? Suprapubic pain 05/30/2017  ? Wears contact lenses   ? ?Past Surgical History:  ?Procedure Laterality Date  ? CARPAL TUNNEL RELEASE Left 05/15/2017  ? Procedure: LEFT CARPAL TUNNEL RELEASE;  Surgeon: [name], MD;  Location: [place];  Service: Orthopedics;  Laterality: Left;  ? COLONOSCOPY    ? COLONOSCOPY WITH PROPOFOL  last one 04-28-2014  ? CYSTOSCOPY N/A 06/06/2014  ? Procedure: CYSTOSCOPY;  Surgeon: [name], MD;  Location: [facility];  Service: Gynecology;  Laterality: N/A;  ? DILATATION & CURRETTAGE/HYSTEROSCOPY WITH RESECTOCOPE N/A 06/06/2014  ? Procedure: [place];  Surgeon: [name], MD;  Location: [facility];  Service: Gynecology;  Laterality: N/A;  ? LAPAROSCOPIC SALPINGO OOPHERECTOMY Left 06/06/2014  ? Procedure: LAPAROSCOPIC LEFT SALPINGO OOPHORECTOMY;  Surgeon: [name], MD;  Location[facility];  Service: Gynecology;  Laterality: Left;  ?  PARATHYROIDECTOMY Right 01/13/2018  ? Procedure: RIGHT SUPERIOR PARATHYROIDECTOMY;  Surgeon: Armandina Gemma, MD;  Location: [facility];  Service: General;  Laterality: Right;  ? PLANTAR FASCIA SURGERY Left 2009  ? topaz procedure  ? TRANSTHORACIC ECHOCARDIOGRAM  08-12-2007  ? normal LV,  ef 65-70%,  abnormal mitral inflow with trivial MR,  trivial PR and TR   ? ?Social History  ? ?Socioeconomic History  ? Marital status: Married  ?  Spouse name: Not on file  ? Number of children: 2  ? Years of education: Not on file  ? Highest education level: Not on file  ?Occupational History  ? Occupation: Print production planner  ?  Employer: [employer]  ?Tobacco Use  ? Smoking status: Never  ? Smokeless tobacco: Never  ?Vaping Use  ? Vaping Use: Never used  ?Substance and Sexual Activity  ? Alcohol use: Yes  ?  Comment: occasional  ? Drug use: Never  ? Sexual activity: Yes  ?  Birth control/protection: Post-menopausal  ?Other Topics Concern  ? Not on file  ?Social History Narrative  ? Married, Aeronautical engineer 2 daughters  ? 1-2 caffeinated beverages/day  ? ?Social Determinants of Health  ? ?Financial Resource Strain: Not on file  ?Food Insecurity: Not on file  ?Transportation Needs: Not on file  ?Physical Activity: Not on file  ?Stress: Not on file  ?Social Connections: Not on file  ? ?No Known Allergies ?Family History  ?Problem Relation Age of Onset  ? Allergic rhinitis Mother   ? Glaucoma Mother   ? Hypertension Mother   ? Hyperlipidemia Mother   ? Heart disease Mother   ? Arthritis-Osteo Father   ? Tuberculosis Maternal Grandfather   ? Diabetes Maternal Grandfather   ? Asthma Paternal Grandmother   ? Dementia Paternal Grandmother   ? Heart disease Paternal Grandfather   ? Tuberculosis Other   ? Colon cancer Neg Hx   ? Stomach cancer Neg Hx   ? Esophageal cancer Neg Hx   ? Pancreatic cancer Neg Hx   ? ? ? ?Current Outpatient Medications (Cardiovascular):  ?  lisinopril-hydrochlorothiazide (ZESTORETIC) 10-12.5 MG tablet, TAKE 1 TABLET BY MOUTH EVERY DAY ? ?Current Outpatient Medications (Respiratory):  ?  cetirizine (ZYRTEC) 10 MG tablet, Take 1 tablet (10 mg total) by mouth 2 (two) times daily as needed for allergies (Can use an extra dose during flare ups). ?  montelukast (SINGULAIR) 10 MG tablet, Take 1 tablet (10 mg total) by mouth at bedtime. ? ?Current Outpatient Medications  (Analgesics):  ?  meloxicam (MOBIC) 15 MG tablet, TAKE 1 TABLET (15 MG TOTAL) BY MOUTH DAILY. ?  naproxen sodium (ALEVE) 220 MG tablet, Take 220 mg by mouth daily as needed (Joint pain). ? ? ?Current Outpatient Medications (Other):  ?  clotrimazole-betamethasone (LOTRISONE) cream, Apply 1 application topically 2 (two) times daily. For 2 weeks ?  famotidine (PEPCID) 40 MG tablet, Take 1 tablet (40 mg total) by mouth daily. ?  gabapentin (NEURONTIN) 100 MG capsule, TAKE 2 CAPSULES (200 MG TOTAL) BY MOUTH AT BEDTIME. ?  Melatonin 5 MG CAPS, Take by mouth. ?  potassium chloride (KLOR-CON) 10 MEQ tablet, TAKE 2 TABLETS BY MOUTH EVERY DAY ?  traZODone (DESYREL) 100 MG tablet, TAKE 1 TABLET BY MOUTH EVERY DAY AT BEDTIME AS NEEDED FOR SLEEP ?  Vitamin D, Ergocalciferol, (DRISDOL) 1.25 MG (50000 UNIT) CAPS capsule, Take 1 capsule by mouth weekly ? ? ?Reviewed prior external information including notes and imaging from  ?primary care provider ?As well  as notes that were available from care everywhere and other healthcare systems. ? ?Past medical history, social, surgical and family history all reviewed in electronic medical record.  No pertanent information unless stated regarding to the chief complaint.  ? ?Review of Systems: ? No headache, visual changes, nausea, vomiting, diarrhea, constipation, dizziness, abdominal pain, skin rash, fevers, chills, night sweats, weight loss, swollen lymph nodes, body aches, joint swelling, chest pain, shortness of breath, mood changes. POSITIVE muscle aches ? ?Objective  ?Blood pressure 118/84, pulse 68, height '5\' 2"'$  (1.575 m), weight 145 lb (65.8 kg), last menstrual period 03/30/2016, SpO2 98 %. ?  ?General: No apparent distress alert and oriented x3 mood and affect normal, dressed appropriately.  ?HEENT: Pupils equal, extraocular movements intact  ?Respiratory: Patient's speak in full sentences and does not appear short of breath  ?Cardiovascular: No lower extremity edema, non tender, no  erythema  ?Gait mild antalgic ?Right ankle significant stiffness noted.  Patient has some less than 5 degrees of dorsiflexion from neutral.  Patient is as 15 degrees of flexion.  Tightness noted of the Achilles and the

## 2021-07-23 ENCOUNTER — Ambulatory Visit: Payer: Self-pay

## 2021-07-23 ENCOUNTER — Ambulatory Visit (INDEPENDENT_AMBULATORY_CARE_PROVIDER_SITE_OTHER): Payer: 59 | Admitting: Family Medicine

## 2021-07-23 ENCOUNTER — Encounter: Payer: Self-pay | Admitting: Family Medicine

## 2021-07-23 VITALS — BP 118/84 | HR 68 | Ht 62.0 in | Wt 145.0 lb

## 2021-07-23 DIAGNOSIS — M79671 Pain in right foot: Secondary | ICD-10-CM | POA: Diagnosis not present

## 2021-07-23 DIAGNOSIS — M722 Plantar fascial fibromatosis: Secondary | ICD-10-CM | POA: Diagnosis not present

## 2021-07-23 NOTE — Patient Instructions (Addendum)
Good to see you  ?Plantar fascitis exercises given ?Keep moving, foot massager when you can ?Keep me updated through mychart ?Follow up in 6 weeks if needed  ?

## 2021-07-23 NOTE — Assessment & Plan Note (Signed)
Improvement noted potentially since patient is in there but unfortunately does have significant tightness of the ankle mortise now.  Patient is doing improvement with the range of motion.  Given some other exercises, discussed massage that can be beneficial as well.  Discussed the gabapentin if it has been helpful still at this point.  Patient can increase activity as tolerated otherwise.  Follow-up with the surgeon to see if surgery is necessary but hopefully patient will continue to improve ?

## 2021-07-27 ENCOUNTER — Encounter: Payer: Self-pay | Admitting: Family Medicine

## 2021-08-29 ENCOUNTER — Other Ambulatory Visit: Payer: Self-pay | Admitting: Family Medicine

## 2021-08-29 DIAGNOSIS — I10 Essential (primary) hypertension: Secondary | ICD-10-CM

## 2021-09-15 ENCOUNTER — Other Ambulatory Visit: Payer: Self-pay | Admitting: Family Medicine

## 2021-09-18 NOTE — Telephone Encounter (Signed)
Sent patient MyChart message to confirm if she is using medication.

## 2021-09-19 ENCOUNTER — Ambulatory Visit (INDEPENDENT_AMBULATORY_CARE_PROVIDER_SITE_OTHER): Payer: 59 | Admitting: Family Medicine

## 2021-09-19 VITALS — BP 112/70 | HR 98 | Temp 98.5°F | Resp 18 | Ht 62.0 in | Wt 149.6 lb

## 2021-09-19 DIAGNOSIS — J029 Acute pharyngitis, unspecified: Secondary | ICD-10-CM

## 2021-09-19 LAB — POCT INFLUENZA A/B
Influenza A, POC: NEGATIVE
Influenza B, POC: NEGATIVE

## 2021-09-19 LAB — POC COVID19 BINAXNOW: SARS Coronavirus 2 Ag: NEGATIVE

## 2021-09-19 LAB — POCT RAPID STREP A (OFFICE): Rapid Strep A Screen: NEGATIVE

## 2021-09-19 MED ORDER — PENICILLIN V POTASSIUM 500 MG PO TABS: 500.0000 mg | ORAL_TABLET | Freq: Two times a day (BID) | ORAL | 0 refills | Status: DC

## 2021-09-19 NOTE — Progress Notes (Signed)
[facility], [place],  [id number] [phone]  Date:  09/19/2021   Name:  Elizabeth Meyer   DOB:  May 10, 1960   MRN:  [id number]  PCP:  [name], DO    Chief Complaint: Sore Throat (Cough, body aches, cough x monday)   History of Present Illness:  Elizabeth Meyer is a 61 y.o. very pleasant female patient who presents with the following:  Pt seen today with illness- pt of Dr Etter Sjogren I have not seen her myself in the past  She notes a ST, fatigue, body aches She is not aware of any strep exposure Sx started about 2 days ago and got worse Some mild cough She did test - for covid this am, however the test was expired No GI symptoms  She does not notice any fever She does not work with children, no known sick or strep throat contacts   Patient Active Problem List   Diagnosis Date Noted   Chronic rhinitis 02/11/2021   Right foot pain 10/30/2020   Dysphagia 06/19/2020   Hyperlipidemia 06/19/2020   Iron deficiency anemia 06/19/2020   Daytime somnolence 06/19/2020   OSA (obstructive sleep apnea) 06/19/2020   Palpitations 04/17/2020   Incomplete right bundle branch block 04/17/2020   Nasal congestion 04/17/2020   Seasonal allergies    History of gastrointestinal ulcer    Heart murmur    Complex cyst of left ovary    Degenerative arthritis of knee, bilateral 05/06/2019   Lower extremity edema 02/11/2019   Joint swelling 02/11/2019   Primary hypertension 01/29/2019   Nonallopathic lesion of sacral region 06/10/2018   Nonallopathic lesion of lumbosacral region 06/10/2018   Nonallopathic lesion of thoracic region 06/10/2018   Nonallopathic lesion of cervical region 06/10/2018   Nonallopathic lesion of rib cage 06/10/2018   Hyperparathyroidism, primary ([place]) 01/11/2018   Low back pain 09/09/2017   Elevated LDL cholesterol level 06/17/2017   Hypothyroidism 06/17/2017    Post-menopausal 06/17/2017   Cough 05/30/2017   Suprapubic pain 05/30/2017   Patellofemoral syndrome of both knees 12/19/2016   Allergic dermatitis 09/16/2015   Plantar fasciitis, right 07/27/2014   Loss of transverse plantar arch 07/27/2014   Pharyngitis 06/21/2014   Endometrial polyp 06/06/2014    Class: Present on Admission   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   Sinusitis, acute frontal 06/03/2014   IBS (irritable bowel syndrome) 03/08/2014    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   Degenerative arthritis of knee, bilateral 05/06/2019   Elevated LDL cholesterol level 06/17/2017   Endometrial polyp    Essential hypertension 01/29/2019   Heart murmur Dr Johnsie Cancel   dx 2012--  Benign--  per echo abnormal mitral inflow   History of gastrointestinal ulcer    Hyperparathyroidism, primary ([place]) 01/11/2018   IBS (irritable bowel syndrome)    Incomplete right bundle branch block 04/17/2020   Joint swelling 02/11/2019   Loss of transverse plantar arch 07/27/2014   Low back pain 09/09/2017   Lower extremity edema 02/11/2019   Nasal congestion 04/17/2020   Nonallopathic lesion of cervical region 06/10/2018   Nonallopathic lesion of lumbosacral region 06/10/2018   Nonallopathic lesion of rib cage 06/10/2018   Nonallopathic lesion of sacral region 06/10/2018   Nonallopathic  lesion of thoracic region 06/10/2018   Palpitations 04/17/2020   Patellofemoral syndrome of both knees 12/19/2016   Inj 10/30 bilateral Bilateral injection September 09, 2017 repeat injections in March 17, 2018 August 06, 2018 Durolane approved 01/15/2019.  Injected January 28, 2019   Pharyngitis 06/21/2014   Plantar fasciitis, right 07/27/2014   Post-menopausal 06/17/2017   Seasonal allergies    Sinusitis, acute frontal 06/03/2014   Suprapubic pain 05/30/2017   Wears contact  lenses     Past Surgical History:  Procedure Laterality Date   CARPAL TUNNEL RELEASE Left 05/15/2017   Procedure: LEFT CARPAL TUNNEL RELEASE;  Surgeon: [name], MD;  Location: [place];  Service: Orthopedics;  Laterality: Left;   COLONOSCOPY     COLONOSCOPY WITH PROPOFOL  last one 04-28-2014   CYSTOSCOPY N/A 06/06/2014   Procedure: CYSTOSCOPY;  Surgeon: [name], MD;  Location[facility];  Service: Gynecology;  Laterality: N/A;   DILATATION & CURRETTAGE/HYSTEROSCOPY WITH RESECTOCOPE N/A 06/06/2014   Procedure: DILATATION & CURETTAGE/HYSTEROSCOPY WITH RESECTOCOPE;  Surgeon: [name], MD;  Location: [place];  Service: Gynecology;  Laterality: N/A;   LAPAROSCOPIC SALPINGO OOPHERECTOMY Left 06/06/2014   Procedure: LAPAROSCOPIC LEFT SALPINGO OOPHORECTOMY;  Surgeon: [name], MD;  Location: [place];  Service: Gynecology;  Laterality: Left;   PARATHYROIDECTOMY Right 01/13/2018   Procedure: RIGHT SUPERIOR PARATHYROIDECTOMY;  Surgeon: Armandina Gemma, MD;  Location: [facility];  Service: General;  Laterality: Right;   PLANTAR FASCIA SURGERY Left 2009   topaz procedure   TRANSTHORACIC ECHOCARDIOGRAM  08-12-2007   normal LV,  ef 65-70%,  abnormal mitral inflow with trivial MR,  trivial PR and TR    Social History   Tobacco Use   Smoking status: Never   Smokeless tobacco: Never  Vaping Use   Vaping Use: Never used  Substance Use Topics   Alcohol use: Yes    Comment: occasional   Drug use: Never    Family History  Problem Relation Age of Onset   Allergic rhinitis Mother    Glaucoma Mother    Hypertension Mother    Hyperlipidemia Mother    Heart disease Mother    Arthritis-Osteo Father    Tuberculosis Maternal Grandfather    Diabetes Maternal Grandfather    Asthma Paternal Grandmother    Dementia Paternal Grandmother    Heart disease Paternal Grandfather    Tuberculosis Other    Colon cancer Neg Hx     Stomach cancer Neg Hx    Esophageal cancer Neg Hx    Pancreatic cancer Neg Hx     No Known Allergies  Medication list has been reviewed and updated.  Current Outpatient Medications on File Prior to Visit  Medication Sig Dispense Refill   cetirizine (ZYRTEC) 10 MG tablet Take 1 tablet (10 mg total) by mouth 2 (two) times daily as needed for allergies (Can use an extra dose during flare ups). 180 tablet 1   clotrimazole-betamethasone (LOTRISONE) cream Apply 1 application topically 2 (two) times daily. For 2 weeks 60 g 0   famotidine (PEPCID) 40 MG tablet Take 1 tablet (40 mg total) by mouth daily. 30 tablet 11   gabapentin (NEURONTIN) 100 MG capsule TAKE 2 CAPSULES (200 MG TOTAL) BY MOUTH AT BEDTIME. 180 capsule 2   lisinopril-hydrochlorothiazide (ZESTORETIC) 10-12.5 MG tablet TAKE 1 TABLET BY MOUTH EVERY DAY 90 tablet 1   Melatonin 5 MG CAPS Take 10 mg by mouth as needed.  montelukast (SINGULAIR) 10 MG tablet Take 1 tablet (10 mg total) by mouth at bedtime. 30 tablet 11   naproxen sodium (ALEVE) 220 MG tablet Take 220 mg by mouth daily as needed (Joint pain).     potassium chloride (KLOR-CON) 10 MEQ tablet TAKE 2 TABLETS BY MOUTH EVERY DAY 180 tablet 1   traZODone (DESYREL) 100 MG tablet TAKE 1 TABLET BY MOUTH AT BEDTIME AS NEEDED FOR SLEEP 90 tablet 1   Vitamin D, Ergocalciferol, (DRISDOL) 1.25 MG (50000 UNIT) CAPS capsule Take 1 capsule by mouth weekly 15 capsule 3   No current facility-administered medications on file prior to visit.    Review of Systems:  As per HPI- otherwise negative.   Physical Examination: Vitals:   09/19/21 1431  BP: 112/70  Pulse: 98  Resp: 18  Temp: 98.5 F (36.9 C)  SpO2: 97%   Vitals:   09/19/21 1431  Weight: 149 lb 9.6 oz (67.9 kg)  Height: '5\' 2"'$  (1.575 m)   Body mass index is 27.36 kg/m. Ideal Body Weight: Weight in (lb) to have BMI = 25: 136.4  GEN: no acute distress.  Minimal overweight, overall looks well HEENT: Atraumatic,  Normocephalic.  Oropharynx displays bilateral erythematous tonsils with exudate, no evidence of abscess. PEERL, TM wnl bilaterally  Ears and Nose: No external deformity. CV: RRR, No M/G/R. No JVD. No thrill. No extra heart sounds. PULM: CTA B, no wheezes, crackles, rhonchi. No retractions. No resp. distress. No accessory muscle use. ABD: S, NT, ND TM within the. No rebound. No HSM. EXTR: No c/c/e PSYCH: Normally interactive. Conversant.   Results for orders placed or performed in visit on 09/19/21  POCT rapid strep A  Result Value Ref Range   Rapid Strep A Screen Negative Negative  POC COVID-19 BinaxNow  Result Value Ref Range   SARS Coronavirus 2 Ag Negative Negative  POCT Influenza A/B  Result Value Ref Range   Influenza A, POC Negative Negative   Influenza B, POC Negative Negative    Assessment and Plan: Pharyngitis, unspecified etiology - Plan: POCT rapid strep A, POC COVID-19 BinaxNow, POCT Influenza A/B, penicillin v potassium (VEETID) 500 MG tablet  Patient seen today with concern of illness.  Rapid test negative for strep, COVID, flu.  Suspect she does have bacterial pharyngitis due to appearance of her throat.  We will start her on penicillin today.  I advised her to please let me know if she is not feeling better in the next 1 to 2 days, sooner if worse Signed Lamar Blinks, MD

## 2021-09-19 NOTE — Patient Instructions (Signed)
It was good to see you today- it looks like you may have strep throat Please let me know if not feeling better in the next 1-2 days- sooner if worse  We will treat you with penicillin for 10 days

## 2021-09-20 ENCOUNTER — Ambulatory Visit: Payer: 59 | Admitting: Family Medicine

## 2021-10-05 ENCOUNTER — Other Ambulatory Visit: Payer: Self-pay | Admitting: Family Medicine

## 2021-10-05 DIAGNOSIS — E039 Hypothyroidism, unspecified: Secondary | ICD-10-CM

## 2021-10-12 ENCOUNTER — Other Ambulatory Visit: Payer: Self-pay | Admitting: Family Medicine

## 2021-10-12 ENCOUNTER — Telehealth: Payer: Self-pay | Admitting: Family Medicine

## 2021-10-12 DIAGNOSIS — I10 Essential (primary) hypertension: Secondary | ICD-10-CM

## 2021-10-12 DIAGNOSIS — Z0000 Encounter for general adult medical examination without abnormal findings: Secondary | ICD-10-CM

## 2021-10-12 NOTE — Telephone Encounter (Signed)
Pt called stating she would like to have her labs done a week prior to her physical to go over results in the appt.

## 2021-11-03 ENCOUNTER — Other Ambulatory Visit: Payer: Self-pay | Admitting: Family Medicine

## 2021-11-03 DIAGNOSIS — E039 Hypothyroidism, unspecified: Secondary | ICD-10-CM

## 2021-11-07 ENCOUNTER — Other Ambulatory Visit: Payer: 59

## 2021-11-09 ENCOUNTER — Other Ambulatory Visit (INDEPENDENT_AMBULATORY_CARE_PROVIDER_SITE_OTHER): Payer: 59

## 2021-11-09 DIAGNOSIS — I10 Essential (primary) hypertension: Secondary | ICD-10-CM

## 2021-11-09 DIAGNOSIS — Z0000 Encounter for general adult medical examination without abnormal findings: Secondary | ICD-10-CM

## 2021-11-09 LAB — CBC WITH DIFFERENTIAL/PLATELET
Basophils Absolute: 0 10*3/uL (ref 0.0–0.1)
Basophils Relative: 0.4 % (ref 0.0–3.0)
Eosinophils Absolute: 0.1 10*3/uL (ref 0.0–0.7)
Eosinophils Relative: 1.6 % (ref 0.0–5.0)
HCT: 36.1 % (ref 36.0–46.0)
Hemoglobin: 12.5 g/dL (ref 12.0–15.0)
Lymphocytes Relative: 23.2 % (ref 12.0–46.0)
Lymphs Abs: 1.4 10*3/uL (ref 0.7–4.0)
MCHC: 34.7 g/dL (ref 30.0–36.0)
MCV: 90.1 fl (ref 78.0–100.0)
Monocytes Absolute: 0.5 10*3/uL (ref 0.1–1.0)
Monocytes Relative: 8.3 % (ref 3.0–12.0)
Neutro Abs: 4.1 10*3/uL (ref 1.4–7.7)
Neutrophils Relative %: 66.5 % (ref 43.0–77.0)
Platelets: 303 10*3/uL (ref 150.0–400.0)
RBC: 4 Mil/uL (ref 3.87–5.11)
RDW: 13.2 % (ref 11.5–15.5)
WBC: 6.2 10*3/uL (ref 4.0–10.5)

## 2021-11-09 LAB — COMPREHENSIVE METABOLIC PANEL
ALT: 13 U/L (ref 0–35)
AST: 15 U/L (ref 0–37)
Albumin: 4.4 g/dL (ref 3.5–5.2)
Alkaline Phosphatase: 73 U/L (ref 39–117)
BUN: 17 mg/dL (ref 6–23)
CO2: 28 mEq/L (ref 19–32)
Calcium: 9.4 mg/dL (ref 8.4–10.5)
Chloride: 106 mEq/L (ref 96–112)
Creatinine, Ser: 0.76 mg/dL (ref 0.40–1.20)
GFR: 86.14 mL/min (ref >60.00)
Glucose, Bld: 91 mg/dL (ref 70–99)
Potassium: 4.2 mEq/L (ref 3.5–5.1)
Sodium: 141 mEq/L (ref 135–145)
Total Bilirubin: 0.5 mg/dL (ref 0.2–1.2)
Total Protein: 6.7 g/dL (ref 6.0–8.3)

## 2021-11-09 LAB — LIPID PANEL
Cholesterol: 211 mg/dL — ABNORMAL HIGH (ref 0–200)
HDL: 57.1 mg/dL (ref >39.00)
LDL Cholesterol: 123 mg/dL — ABNORMAL HIGH (ref 0–99)
NonHDL: 153.43
Total CHOL/HDL Ratio: 4
Triglycerides: 151 mg/dL — ABNORMAL HIGH (ref 0.0–149.0)
VLDL: 30.2 mg/dL (ref 0.0–40.0)

## 2021-11-09 LAB — TSH: TSH: 0.53 u[IU]/mL (ref 0.35–5.50)

## 2021-11-13 ENCOUNTER — Encounter: Payer: Self-pay | Admitting: Family Medicine

## 2021-11-13 ENCOUNTER — Ambulatory Visit (INDEPENDENT_AMBULATORY_CARE_PROVIDER_SITE_OTHER): Payer: 59 | Admitting: Family Medicine

## 2021-11-13 VITALS — BP 126/80 | HR 62 | Temp 98.5°F | Resp 18 | Ht 62.0 in | Wt 152.6 lb

## 2021-11-13 DIAGNOSIS — E039 Hypothyroidism, unspecified: Secondary | ICD-10-CM

## 2021-11-13 DIAGNOSIS — E785 Hyperlipidemia, unspecified: Secondary | ICD-10-CM

## 2021-11-13 DIAGNOSIS — Z0000 Encounter for general adult medical examination without abnormal findings: Secondary | ICD-10-CM | POA: Diagnosis not present

## 2021-11-13 DIAGNOSIS — I10 Essential (primary) hypertension: Secondary | ICD-10-CM

## 2021-11-13 NOTE — Patient Instructions (Addendum)
Cholesterol Content in Foods ?Cholesterol is a waxy, fat-like substance that helps to carry fat in the blood. The body needs cholesterol in small amounts, but too much cholesterol can cause damage to the arteries and heart. ?What foods have cholesterol? ? ?Cholesterol is found in animal-based foods, such as meat, seafood, and dairy. Generally, low-fat dairy and lean meats have less cholesterol than full-fat dairy and fatty meats. The milligrams of cholesterol per serving (mg per serving) of common cholesterol-containing foods are listed below. ?Meats and other proteins ?Egg -- one large whole egg has 186 mg. ?Veal shank -- 4 oz (113 g) has 141 mg. ?Lean ground turkey (93% lean) -- 4 oz (113 g) has 118 mg. ?Fat-trimmed lamb loin -- 4 oz (113 g) has 106 mg. ?Lean ground beef (90% lean) -- 4 oz (113 g) has 100 mg. ?Lobster -- 3.5 oz (99 g) has 90 mg. ?Pork loin chops -- 4 oz (113 g) has 86 mg. ?Canned salmon -- 3.5 oz (99 g) has 83 mg. ?Fat-trimmed beef top loin -- 4 oz (113 g) has 78 mg. ?Frankfurter -- 1 frank (3.5 oz or 99 g) has 77 mg. ?Crab -- 3.5 oz (99 g) has 71 mg. ?Roasted chicken without skin, white meat -- 4 oz (113 g) has 66 mg. ?Light bologna -- 2 oz (57 g) has 45 mg. ?Deli-cut turkey -- 2 oz (57 g) has 31 mg. ?Canned tuna -- 3.5 oz (99 g) has 31 mg. ?Bacon -- 1 oz (28 g) has 29 mg. ?Oysters and mussels (raw) -- 3.5 oz (99 g) has 25 mg. ?Mackerel -- 1 oz (28 g) has 22 mg. ?Trout -- 1 oz (28 g) has 20 mg. ?Pork sausage -- 1 link (1 oz or 28 g) has 17 mg. ?Salmon -- 1 oz (28 g) has 16 mg. ?Tilapia -- 1 oz (28 g) has 14 mg. ?Dairy ?Soft-serve ice cream -- ? cup (4 oz or 86 g) has 103 mg. ?Whole-milk yogurt -- 1 cup (8 oz or 245 g) has 29 mg. ?Cheddar cheese -- 1 oz (28 g) has 28 mg. ?American cheese -- 1 oz (28 g) has 28 mg. ?Whole milk -- 1 cup (8 oz or 250 mL) has 23 mg. ?2% milk -- 1 cup (8 oz or 250 mL) has 18 mg. ?Cream cheese -- 1 tablespoon (Tbsp) (14.5 g) has 15 mg. ?Cottage cheese -- ? cup (4 oz or  113 g) has 14 mg. ?Low-fat (1%) milk -- 1 cup (8 oz or 250 mL) has 10 mg. ?Sour cream -- 1 Tbsp (12 g) has 8.5 mg. ?Low-fat yogurt -- 1 cup (8 oz or 245 g) has 8 mg. ?Nonfat Greek yogurt -- 1 cup (8 oz or 228 g) has 7 mg. ?Half-and-half cream -- 1 Tbsp (15 mL) has 5 mg. ?Fats and oils ?Cod liver oil -- 1 tablespoon (Tbsp) (13.6 g) has 82 mg. ?Butter -- 1 Tbsp (14 g) has 15 mg. ?Lard -- 1 Tbsp (12.8 g) has 14 mg. ?Bacon grease -- 1 Tbsp (12.9 g) has 14 mg. ?Mayonnaise -- 1 Tbsp (13.8 g) has 5-10 mg. ?Margarine -- 1 Tbsp (14 g) has 3-10 mg. ?The items listed above may not be a complete list of foods with cholesterol. Exact amounts of cholesterol in these foods may vary depending on specific ingredients and brands. Contact a dietitian for more information. ?What foods do not have cholesterol? ?Most plant-based foods do not have cholesterol unless you combine them with a food that has   cholesterol. Foods without cholesterol include: Grains and cereals. Vegetables. Fruits. Vegetable oils, such as olive, canola, and sunflower oil. Legumes, such as peas, beans, and lentils. Nuts and seeds. Egg whites. The items listed above may not be a complete list of foods that do not have cholesterol. Contact a dietitian for more information. Summary The body needs cholesterol in small amounts, but too much cholesterol can cause damage to the arteries and heart. Cholesterol is found in animal-based foods, such as meat, seafood, and dairy. Generally, low-fat dairy and lean meats have less cholesterol than full-fat dairy and fatty meats. This information is not intended to replace advice given to you by your health care provider. Make sure you discuss any questions you have with your health care provider. Document Revised: 08/04/2020 Document Reviewed: 08/04/2020 Elsevier Patient Education  2023 Elsevier Inc. Preventive Care 35-42 Years Old, Female Preventive care refers to lifestyle choices and visits with your health  care provider that can promote health and wellness. Preventive care visits are also called wellness exams. What can I expect for my preventive care visit? Counseling Your health care provider may ask you questions about your: Medical history, including: Past medical problems. Family medical history. Pregnancy history. Current health, including: Menstrual cycle. Method of birth control. Emotional well-being. Home life and relationship well-being. Sexual activity and sexual health. Lifestyle, including: Alcohol, nicotine or tobacco, and drug use. Access to firearms. Diet, exercise, and sleep habits. Work and work Statistician. Sunscreen use. Safety issues such as seatbelt and bike helmet use. Physical exam Your health care provider will check your: Height and weight. These may be used to calculate your BMI (body mass index). BMI is a measurement that tells if you are at a healthy weight. Waist circumference. This measures the distance around your waistline. This measurement also tells if you are at a healthy weight and may help predict your risk of certain diseases, such as type 2 diabetes and high blood pressure. Heart rate and blood pressure. Body temperature. Skin for abnormal spots. What immunizations do I need?  Vaccines are usually given at various ages, according to a schedule. Your health care provider will recommend vaccines for you based on your age, medical history, and lifestyle or other factors, such as travel or where you work. What tests do I need? Screening Your health care provider may recommend screening tests for certain conditions. This may include: Lipid and cholesterol levels. Diabetes screening. This is done by checking your blood sugar (glucose) after you have not eaten for a while (fasting). Pelvic exam and Pap test. Hepatitis B test. Hepatitis C test. HIV (human immunodeficiency virus) test. STI (sexually transmitted infection) testing, if you are at  risk. Lung cancer screening. Colorectal cancer screening. Mammogram. Talk with your health care provider about when you should start having regular mammograms. This may depend on whether you have a family history of breast cancer. BRCA-related cancer screening. This may be done if you have a family history of breast, ovarian, tubal, or peritoneal cancers. Bone density scan. This is done to screen for osteoporosis. Talk with your health care provider about your test results, treatment options, and if necessary, the need for more tests. Follow these instructions at home: Eating and drinking  Eat a diet that includes fresh fruits and vegetables, whole grains, lean protein, and low-fat dairy products. Take vitamin and mineral supplements as recommended by your health care provider. Do not drink alcohol if: Your health care provider tells you not to drink. You are pregnant,  may be pregnant, or are planning to become pregnant. If you drink alcohol: Limit how much you have to 0-1 drink a day. Know how much alcohol is in your drink. In the [place] one drink equals one 12 oz bottle of beer (355 mL), one 5 oz glass of wine (148 mL), or one 1 oz glass of hard liquor (44 mL). Lifestyle Brush your teeth every morning and night with fluoride toothpaste. Floss one time each day. Exercise for at least 30 minutes 5 or more days each week. Do not use any products that contain nicotine or tobacco. These products include cigarettes, chewing tobacco, and vaping devices, such as e-cigarettes. If you need help quitting, ask your health care provider. Do not use drugs. If you are sexually active, practice safe sex. Use a condom or other form of protection to prevent STIs. If you do not wish to become pregnant, use a form of birth control. If you plan to become pregnant, see your health care provider for a prepregnancy visit. Take aspirin only as told by your health care provider. Make sure that you understand how  much to take and what form to take. Work with your health care provider to find out whether it is safe and beneficial for you to take aspirin daily. Find healthy ways to manage stress, such as: Meditation, yoga, or listening to music. Journaling. Talking to a trusted person. Spending time with friends and family. Minimize exposure to UV radiation to reduce your risk of skin cancer. Safety Always wear your seat belt while driving or riding in a vehicle. Do not drive: If you have been drinking alcohol. Do not ride with someone who has been drinking. When you are tired or distracted. While texting. If you have been using any mind-altering substances or drugs. Wear a helmet and other protective equipment during sports activities. If you have firearms in your house, make sure you follow all gun safety procedures. Seek help if you have been physically or sexually abused. What's next? Visit your health care provider once a year for an annual wellness visit. Ask your health care provider how often you should have your eyes and teeth checked. Stay up to date on all vaccines. This information is not intended to replace advice given to you by your health care provider. Make sure you discuss any questions you have with your health care provider. Document Revised: 09/20/2020 Document Reviewed: 09/20/2020 Elsevier Patient Education  [place].

## 2021-11-13 NOTE — Progress Notes (Signed)
Subjective:   By signing my name below, I, Elizabeth Meyer, attest that this documentation has been prepared under the direction and in the presence of [name], DO  11/13/2021    Patient ID: Elizabeth Meyer, female    DOB: 1960-07-27, 61 y.o.   MRN: [id number]  Chief Complaint  Patient presents with   Annual Exam    Pt did labs last week.     HPI Patient is in today for a comprehensive physical exam.   Patient is doing well during this visit and has no new issues to report.  Her cholesterol levels were elevated during her last lab work. She is planning on dieting to help reduce her cholesterol levels.  Lab Results  Component Value Date   CHOL 211 (H) 11/09/2021   HDL 57.10 11/09/2021   LDLCALC 123 (H) 11/09/2021   TRIG 151.0 (H) 11/09/2021   CHOLHDL 4 11/09/2021   She reports a soft and non-tender bump on her bottom lip that comes and goes. She finds it typically last for at least 2 weeks before leaving and shortly returns. She is planning on bringing up her symptoms to her dermatologist.  She denies having any fever, new moles, congestion, sinus pain, sore throat, chest pain, palpitations, cough, shortness of breath, wheezing, nausea, vomiting, diarrhea, constipation, dysuria, frequency, abdominal pain, hematuria, new muscle pain, new joint pain, headaches. She is not exercising regularly due to her foot pain. She is planning on participating in exercising while seated. She had therapy on her foot done. She had prp therapy in her foot as well and reports having pain. She wont know if treatment was effective until 3-4 weeks following her procedure.    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   Degenerative arthritis of knee, bilateral 05/06/2019   Elevated LDL cholesterol level 06/17/2017   Endometrial polyp     Essential hypertension 01/29/2019   Heart murmur Dr Johnsie Cancel   dx 2012--  Benign--  per echo abnormal mitral inflow   History of gastrointestinal ulcer    Hyperparathyroidism, primary ([place]) 01/11/2018   IBS (irritable bowel syndrome)    Incomplete right bundle branch block 04/17/2020   Joint swelling 02/11/2019   Loss of transverse plantar arch 07/27/2014   Low back pain 09/09/2017   Lower extremity edema 02/11/2019   Nasal congestion 04/17/2020   Nonallopathic lesion of cervical region 06/10/2018   Nonallopathic lesion of lumbosacral region 06/10/2018   Nonallopathic lesion of rib cage 06/10/2018   Nonallopathic lesion of sacral region 06/10/2018   Nonallopathic lesion of thoracic region 06/10/2018   Palpitations 04/17/2020   Patellofemoral syndrome of both knees 12/19/2016   Inj 10/30 bilateral Bilateral injection September 09, 2017 repeat injections in March 17, 2018 August 06, 2018 Durolane approved 01/15/2019.  Injected January 28, 2019   Pharyngitis 06/21/2014   Plantar fasciitis, right 07/27/2014   Post-menopausal 06/17/2017   Seasonal allergies    Sinusitis, acute frontal 06/03/2014   Suprapubic pain 05/30/2017   Wears contact lenses     Past Surgical History:  Procedure Laterality Date   CARPAL TUNNEL RELEASE Left 05/15/2017   Procedure: LEFT CARPAL TUNNEL RELEASE;  Surgeon: [name], MD;  Location: [place];  Service: Orthopedics;  Laterality: Left;   COLONOSCOPY     COLONOSCOPY WITH PROPOFOL  last one 04-28-2014   CYSTOSCOPY N/A 06/06/2014   Procedure: CYSTOSCOPY;  Surgeon: [name], MD;  Location: [facility];  Service: Gynecology;  Laterality: N/A;   DILATATION & CURRETTAGE/HYSTEROSCOPY WITH RESECTOCOPE N/A 06/06/2014   Procedure: DILATATION & CURETTAGE/HYSTEROSCOPY WITH RESECTOCOPE;  Surgeon: [name], MD;  Location: [place];  Service: Gynecology;  Laterality: N/A;   LAPAROSCOPIC SALPINGO OOPHERECTOMY Left 06/06/2014   Procedure:  LAPAROSCOPIC LEFT SALPINGO OOPHORECTOMY;  Surgeon: [name], MD;  Location: [place];  Service: Gynecology;  Laterality: Left;   PARATHYROIDECTOMY Right 01/13/2018   Procedure: RIGHT SUPERIOR PARATHYROIDECTOMY;  Surgeon: Armandina Gemma, MD;  Location: [facility];  Service: General;  Laterality: Right;   PLANTAR FASCIA SURGERY Left 2009   topaz procedure   TRANSTHORACIC ECHOCARDIOGRAM  08-12-2007   normal LV,  ef 65-70%,  abnormal mitral inflow with trivial MR,  trivial PR and TR    Family History  Problem Relation Age of Onset   Allergic rhinitis Mother    Glaucoma Mother    Hypertension Mother    Hyperlipidemia Mother    Heart disease Mother    Arthritis-Osteo Father    Tuberculosis Maternal Grandfather    Diabetes Maternal Grandfather    Asthma Paternal Grandmother    Dementia Paternal Grandmother    Heart disease Paternal Grandfather    Tuberculosis Other    Colon cancer Neg Hx    Stomach cancer Neg Hx    Esophageal cancer Neg Hx    Pancreatic cancer Neg Hx     Social History   Socioeconomic History   Marital status: Married    Spouse name: Not on file   Number of children: 2   Years of education: Not on file   Highest education level: Not on file  Occupational History   Occupation: preschool Product manager: [employer]  Tobacco Use   Smoking status: Never   Smokeless tobacco: Never  Vaping Use   Vaping Use: Never used  Substance and Sexual Activity   Alcohol use: Yes    Comment: occasional   Drug use: Never   Sexual activity: Yes    Birth control/protection: Post-menopausal  Other Topics Concern   Not on file  Social History Narrative   Married, Aeronautical engineer 2 daughters   1-2 caffeinated beverages/day   Social Determinants of Health   Financial Resource Strain: Not on file  Food Insecurity: Not on file  Transportation Needs: Not on file  Physical Activity: Not on file  Stress: Not on file  Social Connections: Not on file   Intimate Partner Violence: Not on file    Outpatient Medications Prior to Visit  Medication Sig Dispense Refill   cetirizine (ZYRTEC) 10 MG tablet Take 1 tablet (10 mg total) by mouth 2 (two) times daily as needed for allergies (Can use an extra dose during flare ups). 180 tablet 1   clotrimazole-betamethasone (LOTRISONE) cream Apply 1 application topically 2 (two) times daily. For 2 weeks 60 g 0   famotidine (PEPCID) 40 MG tablet Take 1 tablet (40 mg total) by mouth daily. 30 tablet 11   gabapentin (NEURONTIN) 100 MG capsule TAKE 2 CAPSULES (200 MG TOTAL) BY MOUTH AT BEDTIME. 180 capsule 2   lisinopril-hydrochlorothiazide (ZESTORETIC) 10-12.5 MG tablet TAKE 1 TABLET BY MOUTH EVERY DAY 90 tablet 1   Melatonin 5 MG CAPS Take 10 mg by mouth as needed.     montelukast (SINGULAIR) 10 MG tablet Take 1 tablet (  10 mg total) by mouth at bedtime. 30 tablet 11   potassium chloride (KLOR-CON) 10 MEQ tablet TAKE 2 TABLETS BY MOUTH EVERY DAY 60 tablet 0   traZODone (DESYREL) 100 MG tablet TAKE 1 TABLET BY MOUTH AT BEDTIME AS NEEDED FOR SLEEP 90 tablet 1   Vitamin D, Ergocalciferol, (DRISDOL) 1.25 MG (50000 UNIT) CAPS capsule Take 1 capsule by mouth weekly 15 capsule 3   naproxen sodium (ALEVE) 220 MG tablet Take 220 mg by mouth daily as needed (Joint pain). (Patient not taking: Reported on 11/13/2021)     penicillin v potassium (VEETID) 500 MG tablet Take 1 tablet (500 mg total) by mouth 2 (two) times daily. (Patient not taking: Reported on 11/13/2021) 20 tablet 0   No facility-administered medications prior to visit.    No Known Allergies  Review of Systems  Constitutional:  Negative for fever.  HENT:  Negative for congestion, sinus pain and sore throat.   Respiratory:  Negative for cough, shortness of breath and wheezing.   Cardiovascular:  Negative for chest pain and palpitations.  Gastrointestinal:  Negative for abdominal pain, constipation, diarrhea, nausea and vomiting.  Genitourinary:  Negative  for dysuria, frequency and hematuria.  Musculoskeletal:        (-)new muscle pain (-)new joint pain  Skin:        (-)New moles  Neurological:  Negative for headaches.       Objective:    Physical Exam Constitutional:      General: She is not in acute distress.    Appearance: Normal appearance. She is not ill-appearing.  HENT:     Head: Normocephalic and atraumatic.     Right Ear: Tympanic membrane, ear canal and external ear normal.     Left Ear: Tympanic membrane, ear canal and external ear normal.  Eyes:     Extraocular Movements: Extraocular movements intact.     Pupils: Pupils are equal, round, and reactive to light.  Cardiovascular:     Rate and Rhythm: Normal rate and regular rhythm.     Heart sounds: Normal heart sounds. No murmur heard.    No gallop.  Pulmonary:     Effort: Pulmonary effort is normal. No respiratory distress.     Breath sounds: Normal breath sounds. No wheezing or rales.  Abdominal:     General: Bowel sounds are normal. There is no distension.     Palpations: Abdomen is soft.     Tenderness: There is no abdominal tenderness. There is no guarding.  Skin:    General: Skin is warm and dry.  Neurological:     Mental Status: She is alert and oriented to person, place, and time.  Psychiatric:        Judgment: Judgment normal.     BP 126/80 (BP Location: Left Arm, Patient Position: Sitting, Cuff Size: Normal)   Pulse 62   Temp 98.5 F (36.9 C) (Oral)   Resp 18   Ht '5\' 2"'$  (1.575 m)   Wt 152 lb 9.6 oz (69.2 kg)   LMP 03/30/2016   SpO2 97%   BMI 27.91 kg/m  Wt Readings from Last 3 Encounters:  11/13/21 152 lb 9.6 oz (69.2 kg)  09/19/21 149 lb 9.6 oz (67.9 kg)  07/23/21 145 lb (65.8 kg)    Diabetic Foot Exam - Simple   No data filed    Lab Results  Component Value Date   WBC 6.2 11/09/2021   HGB 12.5 11/09/2021   HCT 36.1 11/09/2021   PLT  303.0 11/09/2021   GLUCOSE 91 11/09/2021   CHOL 211 (H) 11/09/2021   TRIG 151.0 (H) 11/09/2021    HDL 57.10 11/09/2021   LDLCALC 123 (H) 11/09/2021   ALT 13 11/09/2021   AST 15 11/09/2021   NA 141 11/09/2021   K 4.2 11/09/2021   CL 106 11/09/2021   CREATININE 0.76 11/09/2021   BUN 17 11/09/2021   CO2 28 11/09/2021   TSH 0.53 11/09/2021    Lab Results  Component Value Date   TSH 0.53 11/09/2021   Lab Results  Component Value Date   WBC 6.2 11/09/2021   HGB 12.5 11/09/2021   HCT 36.1 11/09/2021   MCV 90.1 11/09/2021   PLT 303.0 11/09/2021   Lab Results  Component Value Date   NA 141 11/09/2021   K 4.2 11/09/2021   CO2 28 11/09/2021   GLUCOSE 91 11/09/2021   BUN 17 11/09/2021   CREATININE 0.76 11/09/2021   BILITOT 0.5 11/09/2021   ALKPHOS 73 11/09/2021   AST 15 11/09/2021   ALT 13 11/09/2021   PROT 6.7 11/09/2021   ALBUMIN 4.4 11/09/2021   CALCIUM 9.4 11/09/2021   ANIONGAP 6 01/09/2018   GFR 86.14 11/09/2021   Lab Results  Component Value Date   CHOL 211 (H) 11/09/2021   Lab Results  Component Value Date   HDL 57.10 11/09/2021   Lab Results  Component Value Date   LDLCALC 123 (H) 11/09/2021   Lab Results  Component Value Date   TRIG 151.0 (H) 11/09/2021   Lab Results  Component Value Date   CHOLHDL 4 11/09/2021   No results found for: "HGBA1C"  Mammogram: Last completed 06/17/2019.  Colonoscopy: Last completed 04/28/2014. Results showed: 1. Normal colonoscopy 2. The examined terminal ileum appeared to be normal Pap smear:  Repeat in 10 years.  Dexa: Last completed 07/31/2017. Results showed she has osteopenia. Repeat in 3 years.      Assessment & Plan:   Problem List Items Addressed This Visit       Unprioritized   Primary hypertension    Well controlled, no changes to meds. Encouraged heart healthy diet such as the DASH diet and exercise as tolerated.       Preventative health care - Primary    ghm utd Check labs  See avs       Hypothyroidism    Check labs       Hyperlipidemia    Encourage heart healthy diet such as  MIND or DASH diet, increase exercise, avoid trans fats, simple carbohydrates and processed foods, consider a krill or fish or flaxseed oil cap daily.       Relevant Orders   Comprehensive metabolic panel   Lipid panel     No orders of the defined types were placed in this encounter.   IAnn Held, DO, personally preformed the services described in this documentation.  All medical record entries made by the scribe were at my direction and in my presence.  I have reviewed the chart and discharge instructions (if applicable) and agree that the record reflects my personal performance and is accurate and complete. 11/13/2021   I,Elizabeth Meyer,acting as a scribe for [name], DO.,have documented all relevant documentation on the behalf of [name], DO,as directed by  [name], DO while in the presence of [name], DO.   [name], DO

## 2021-11-18 DIAGNOSIS — Z0000 Encounter for general adult medical examination without abnormal findings: Secondary | ICD-10-CM | POA: Insufficient documentation

## 2021-11-18 NOTE — Assessment & Plan Note (Signed)
Encourage heart healthy diet such as MIND or DASH diet, increase exercise, avoid trans fats, simple carbohydrates and processed foods, consider a krill or fish or flaxseed oil cap daily.  °

## 2021-11-18 NOTE — Assessment & Plan Note (Signed)
Check labs 

## 2021-11-18 NOTE — Assessment & Plan Note (Signed)
Well controlled, no changes to meds. Encouraged heart healthy diet such as the DASH diet and exercise as tolerated.  °

## 2021-11-18 NOTE — Assessment & Plan Note (Signed)
ghm utd Check labs  See avs  

## 2021-11-29 ENCOUNTER — Other Ambulatory Visit: Payer: Self-pay | Admitting: Family Medicine

## 2021-11-29 DIAGNOSIS — I10 Essential (primary) hypertension: Secondary | ICD-10-CM

## 2021-12-02 ENCOUNTER — Other Ambulatory Visit: Payer: Self-pay | Admitting: Family Medicine

## 2021-12-02 DIAGNOSIS — E039 Hypothyroidism, unspecified: Secondary | ICD-10-CM

## 2021-12-16 ENCOUNTER — Encounter: Payer: Self-pay | Admitting: Family Medicine

## 2021-12-21 ENCOUNTER — Other Ambulatory Visit: Payer: Self-pay | Admitting: Obstetrics and Gynecology

## 2021-12-21 DIAGNOSIS — Z8249 Family history of ischemic heart disease and other diseases of the circulatory system: Secondary | ICD-10-CM

## 2021-12-23 ENCOUNTER — Other Ambulatory Visit: Payer: Self-pay | Admitting: Gastroenterology

## 2022-01-01 ENCOUNTER — Encounter: Payer: Self-pay | Admitting: Internal Medicine

## 2022-01-03 ENCOUNTER — Other Ambulatory Visit: Payer: Self-pay | Admitting: Internal Medicine

## 2022-01-03 MED ORDER — ALENDRONATE SODIUM 70 MG PO TABS: 70.0000 mg | ORAL_TABLET | ORAL | 11 refills | Status: DC

## 2022-01-04 ENCOUNTER — Other Ambulatory Visit: Payer: Self-pay | Admitting: Family Medicine

## 2022-01-23 ENCOUNTER — Encounter: Payer: Self-pay | Admitting: Internal Medicine

## 2022-01-23 ENCOUNTER — Ambulatory Visit
Admission: RE | Admit: 2022-01-23 | Discharge: 2022-01-23 | Disposition: A | Discharge status: Home / Self Care | Payer: No Typology Code available for payment source | Source: Ambulatory Visit | Attending: Obstetrics and Gynecology | Admitting: Obstetrics and Gynecology

## 2022-01-23 DIAGNOSIS — Z8249 Family history of ischemic heart disease and other diseases of the circulatory system: Secondary | ICD-10-CM

## 2022-02-07 ENCOUNTER — Encounter: Payer: Self-pay | Admitting: Internal Medicine

## 2022-02-13 ENCOUNTER — Other Ambulatory Visit: Payer: 59

## 2022-02-19 ENCOUNTER — Other Ambulatory Visit (INDEPENDENT_AMBULATORY_CARE_PROVIDER_SITE_OTHER): Payer: 59

## 2022-02-19 DIAGNOSIS — E785 Hyperlipidemia, unspecified: Secondary | ICD-10-CM

## 2022-02-19 LAB — COMPREHENSIVE METABOLIC PANEL
ALT: 16 U/L (ref 0–35)
AST: 16 U/L (ref 0–37)
Albumin: 4.2 g/dL (ref 3.5–5.2)
Alkaline Phosphatase: 78 U/L (ref 39–117)
BUN: 16 mg/dL (ref 6–23)
CO2: 30 mEq/L (ref 19–32)
Calcium: 8.8 mg/dL (ref 8.4–10.5)
Chloride: 106 mEq/L (ref 96–112)
Creatinine, Ser: 0.76 mg/dL (ref 0.40–1.20)
GFR: 85.97 mL/min (ref >60.00)
Glucose, Bld: 89 mg/dL (ref 70–99)
Potassium: 3.5 mEq/L (ref 3.5–5.1)
Sodium: 141 mEq/L (ref 135–145)
Total Bilirubin: 0.5 mg/dL (ref 0.2–1.2)
Total Protein: 6.5 g/dL (ref 6.0–8.3)

## 2022-02-19 LAB — LIPID PANEL
Cholesterol: 213 mg/dL — ABNORMAL HIGH (ref 0–200)
HDL: 72.3 mg/dL (ref >39.00)
LDL Cholesterol: 115 mg/dL — ABNORMAL HIGH (ref 0–99)
NonHDL: 141.06
Total CHOL/HDL Ratio: 3
Triglycerides: 130 mg/dL (ref 0.0–149.0)
VLDL: 26 mg/dL (ref 0.0–40.0)

## 2022-03-06 ENCOUNTER — Other Ambulatory Visit: Payer: Self-pay | Admitting: Family Medicine

## 2022-03-06 DIAGNOSIS — E039 Hypothyroidism, unspecified: Secondary | ICD-10-CM

## 2022-03-08 ENCOUNTER — Encounter: Payer: Self-pay | Admitting: Family Medicine

## 2022-03-11 NOTE — Telephone Encounter (Signed)
3 months

## 2022-03-12 ENCOUNTER — Ambulatory Visit: Payer: Self-pay

## 2022-03-12 ENCOUNTER — Other Ambulatory Visit: Payer: Self-pay

## 2022-03-12 ENCOUNTER — Encounter: Payer: Self-pay | Admitting: Family Medicine

## 2022-03-12 ENCOUNTER — Ambulatory Visit (INDEPENDENT_AMBULATORY_CARE_PROVIDER_SITE_OTHER): Payer: 59 | Admitting: Family Medicine

## 2022-03-12 VITALS — BP 112/82 | HR 90 | Ht 62.0 in | Wt 151.0 lb

## 2022-03-12 DIAGNOSIS — M722 Plantar fascial fibromatosis: Secondary | ICD-10-CM

## 2022-03-12 DIAGNOSIS — E785 Hyperlipidemia, unspecified: Secondary | ICD-10-CM

## 2022-03-12 DIAGNOSIS — M79671 Pain in right foot: Secondary | ICD-10-CM

## 2022-03-12 NOTE — Assessment & Plan Note (Signed)
Had such difficulty over the year and a half. Finally healed mostly with prolo therapy and whole blood injections patient is doing relatively well with this at the moment.  Discussed icing regimen and home exercises still at this point.  Discussed doing the Exercises and strengthening the posterior capsule.  Follow-up again as needed

## 2022-03-12 NOTE — Progress Notes (Signed)
[address] [place] Phone: [phone] Subjective:   Elizabeth Meyer, am serving as a scribe for Dr. Hulan Saas.  I'm seeing this patient by the request  of:  Elizabeth Held, DO  CC: Right foot pain follow-up  XOV:[id number]  07/23/2021 Improvement noted potentially since patient is in there but unfortunately does have significant tightness of the ankle mortise now.  Patient is doing improvement with the range of motion.  Given some other exercises, discussed massage that can be beneficial as well.  Discussed the gabapentin if it has been helpful still at this point.  Patient can increase activity as tolerated otherwise.  Follow-up with the surgeon to see if surgery is necessary but hopefully patient will continue to improve   Update 03/12/2022 [name] is a 61 y.o. female coming in with complaint of R foot and ankle pain. Patient states that she is much better than last visit but is here to get her foot looked at again. Has some pain but a month ago she finally started to feel better.       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   Degenerative arthritis of knee, bilateral 05/06/2019   Elevated LDL cholesterol level 06/17/2017   Endometrial polyp    Essential hypertension 01/29/2019   Heart murmur Dr Johnsie Cancel   dx 2012--  Benign--  per echo abnormal mitral inflow   History of gastrointestinal ulcer    Hyperparathyroidism, primary ([place]) 01/11/2018   IBS (irritable bowel syndrome)    Incomplete right bundle branch block 04/17/2020   Joint swelling 02/11/2019   Loss of transverse plantar arch 07/27/2014   Low back pain 09/09/2017   Lower extremity edema 02/11/2019   Nasal congestion 04/17/2020   Nonallopathic lesion of cervical region 06/10/2018    Nonallopathic lesion of lumbosacral region 06/10/2018   Nonallopathic lesion of rib cage 06/10/2018   Nonallopathic lesion of sacral region 06/10/2018   Nonallopathic lesion of thoracic region 06/10/2018   Palpitations 04/17/2020   Patellofemoral syndrome of both knees 12/19/2016   Inj 10/30 bilateral Bilateral injection September 09, 2017 repeat injections in March 17, 2018 August 06, 2018 Durolane approved 01/15/2019.  Injected January 28, 2019   Pharyngitis 06/21/2014   Plantar fasciitis, right 07/27/2014   Post-menopausal 06/17/2017   Seasonal allergies    Sinusitis, acute frontal 06/03/2014   Suprapubic pain 05/30/2017   Wears contact lenses    Past Surgical History:  Procedure Laterality Date   CARPAL TUNNEL RELEASE Left 05/15/2017   Procedure: LEFT CARPAL TUNNEL RELEASE;  Surgeon: [name], MD;  Location: [facility];  Service: Orthopedics;  Laterality: Left;   COLONOSCOPY     COLONOSCOPY WITH PROPOFOL  last one 04-28-2014   CYSTOSCOPY N/A 06/06/2014   Procedure: CYSTOSCOPY;  Surgeon: [name], MD;  Location: [facility];  Service: Gynecology;  Laterality: N/A;   DILATATION & CURRETTAGE/HYSTEROSCOPY WITH RESECTOCOPE N/A 06/06/2014   Procedure: DILATATION & CURETTAGE/HYSTEROSCOPY WITH RESECTOCOPE;  Surgeon: [name], MD;  Location: [facility];  Service: Gynecology;  Laterality: N/A;   LAPAROSCOPIC SALPINGO OOPHERECTOMY Left 06/06/2014   Procedure: LAPAROSCOPIC LEFT SALPINGO OOPHORECTOMY;  Surgeon: [name], MD;  Location: [place];  Service: Gynecology;  Laterality: Left;  PARATHYROIDECTOMY Right 01/13/2018   Procedure: RIGHT SUPERIOR PARATHYROIDECTOMY;  Surgeon: Armandina Gemma, MD;  Location: [facility];  Service: General;  Laterality: Right;   PLANTAR FASCIA SURGERY Left 2009   topaz procedure   TRANSTHORACIC ECHOCARDIOGRAM  08-12-2007   normal LV,  ef 65-70%,  abnormal mitral inflow with trivial MR,  trivial PR and TR    Social History   Socioeconomic History   Marital status: Married    Spouse name: Not on file   Number of children: 2   Years of education: Not on file   Highest education level: Not on file  Occupational History   Occupation: preschool teacher    Employer: [employer]  Tobacco Use   Smoking status: Never   Smokeless tobacco: Never  Vaping Use   Vaping Use: Never used  Substance and Sexual Activity   Alcohol use: Yes    Comment: occasional   Drug use: Never   Sexual activity: Yes    Birth control/protection: Post-menopausal  Other Topics Concern   Not on file  Social History Narrative   Married, Aeronautical engineer 2 daughters   1-2 caffeinated beverages/day   Social Determinants of Health   Financial Resource Strain: Not on file  Food Insecurity: Not on file  Transportation Needs: Not on file  Physical Activity: Not on file  Stress: Not on file  Social Connections: Not on file   Meyer Known Allergies Family History  Problem Relation Age of Onset   Allergic rhinitis Mother    Glaucoma Mother    Hypertension Mother    Hyperlipidemia Mother    Heart disease Mother    Arthritis-Osteo Father    Tuberculosis Maternal Grandfather    Diabetes Maternal Grandfather    Asthma Paternal Grandmother    Dementia Paternal Grandmother    Heart disease Paternal Grandfather    Tuberculosis Other    Colon cancer Neg Hx    Stomach cancer Neg Hx    Esophageal cancer Neg Hx    Pancreatic cancer Neg Hx     Current Outpatient Medications (Endocrine & Metabolic):    alendronate (FOSAMAX) 70 MG tablet, Take 1 tablet (70 mg total) by mouth every 7 (seven) days. Take with a full glass of water on an empty stomach.  Current Outpatient Medications (Cardiovascular):    lisinopril-hydrochlorothiazide (ZESTORETIC) 10-12.5 MG tablet, Take 1 tablet by mouth daily.  Current Outpatient Medications (Respiratory):    cetirizine (ZYRTEC) 10 MG tablet, Take 1 tablet (10 mg total) by mouth 2  (two) times daily as needed for allergies (Can use an extra dose during flare ups).   montelukast (SINGULAIR) 10 MG tablet, Take 1 tablet (10 mg total) by mouth at bedtime.    Current Outpatient Medications (Other):    clotrimazole-betamethasone (LOTRISONE) cream, Apply 1 application topically 2 (two) times daily. For 2 weeks   famotidine (PEPCID) 40 MG tablet, TAKE 1 TABLET BY MOUTH EVERY DAY   gabapentin (NEURONTIN) 100 MG capsule, TAKE 2 CAPSULES BY MOUTH AT BEDTIME.   Melatonin 5 MG CAPS, Take 10 mg by mouth as needed.   potassium chloride (KLOR-CON) 10 MEQ tablet, TAKE 2 TABLETS BY MOUTH DAILY   traZODone (DESYREL) 100 MG tablet, TAKE 1 TABLET BY MOUTH AT BEDTIME AS NEEDED FOR SLEEP   Vitamin D, Ergocalciferol, (DRISDOL) 1.25 MG (50000 UNIT) CAPS capsule, Take 1 capsule by mouth weekly   Reviewed prior external information including notes and imaging from  primary care provider As well as notes that were available from care  everywhere and other healthcare systems.  Past medical history, social, surgical and family history all reviewed in electronic medical record.  Meyer pertanent information unless stated regarding to the chief complaint.   Review of Systems:  Meyer headache, visual changes, nausea, vomiting, diarrhea, constipation, dizziness, abdominal pain, skin rash, fevers, chills, night sweats, weight loss, swollen lymph nodes, body aches, joint swelling, chest pain, shortness of breath, mood changes. POSITIVE muscle aches  Objective  Blood pressure 112/82, pulse 90, height '5\' 2"'$  (1.575 m), weight 151 lb (68.5 kg), last menstrual period 03/30/2016, SpO2 97 %.   General: Meyer apparent distress alert and oriented x3 mood and affect normal, dressed appropriately.  HEENT: Pupils equal, extraocular movements intact  Respiratory: Patient's speak in full sentences and does not appear short of breath  Cardiovascular: Meyer lower extremity edema, non tender, Meyer erythema  Right foot pain still  has some mild tenderness over the medial calcaneal area.  Patient has good range of motion otherwise.  Still mild longitudinal breakdown noted with standing.  Limited muscular skeletal ultrasound was performed and interpreted by Hulan Saas, M  Limited ultrasound of patient's foot shows that patient still has some scar tissue formation noted in the plantar fascia itself but well-healed with Meyer intersubstance tearing noted.  Still has thickening of the capsule that is consistent with a chronic plantar fasciitis.  Patient also has what appears to be some scar tissue formation noted in the fat pad. Impression: Interval improvement    Impression and Recommendations:     The above documentation has been reviewed and is accurate and complete Lyndal Pulley, DO

## 2022-03-13 ENCOUNTER — Other Ambulatory Visit: Payer: Self-pay | Admitting: Family Medicine

## 2022-05-05 ENCOUNTER — Other Ambulatory Visit: Payer: Self-pay | Admitting: Family Medicine

## 2022-05-05 DIAGNOSIS — E039 Hypothyroidism, unspecified: Secondary | ICD-10-CM

## 2022-05-28 ENCOUNTER — Other Ambulatory Visit (INDEPENDENT_AMBULATORY_CARE_PROVIDER_SITE_OTHER): Payer: 59

## 2022-05-28 DIAGNOSIS — E785 Hyperlipidemia, unspecified: Secondary | ICD-10-CM | POA: Diagnosis not present

## 2022-05-28 LAB — COMPREHENSIVE METABOLIC PANEL
ALT: 15 U/L (ref 0–35)
AST: 16 U/L (ref 0–37)
Albumin: 4.1 g/dL (ref 3.5–5.2)
Alkaline Phosphatase: 72 U/L (ref 39–117)
BUN: 13 mg/dL (ref 6–23)
CO2: 28 mEq/L (ref 19–32)
Calcium: 9.1 mg/dL (ref 8.4–10.5)
Chloride: 104 mEq/L (ref 96–112)
Creatinine, Ser: 0.79 mg/dL (ref 0.40–1.20)
GFR: 81.91 mL/min (ref >60.00)
Glucose, Bld: 92 mg/dL (ref 70–99)
Potassium: 3.8 mEq/L (ref 3.5–5.1)
Sodium: 141 mEq/L (ref 135–145)
Total Bilirubin: 0.4 mg/dL (ref 0.2–1.2)
Total Protein: 6.5 g/dL (ref 6.0–8.3)

## 2022-05-28 LAB — LIPID PANEL
Cholesterol: 202 mg/dL — ABNORMAL HIGH (ref 0–200)
HDL: 72.3 mg/dL (ref >39.00)
LDL Cholesterol: 113 mg/dL — ABNORMAL HIGH (ref 0–99)
NonHDL: 129.55
Total CHOL/HDL Ratio: 3
Triglycerides: 82 mg/dL (ref 0.0–149.0)
VLDL: 16.4 mg/dL (ref 0.0–40.0)

## 2022-06-06 ENCOUNTER — Encounter: Payer: Self-pay | Admitting: Internal Medicine

## 2022-06-06 ENCOUNTER — Ambulatory Visit (INDEPENDENT_AMBULATORY_CARE_PROVIDER_SITE_OTHER): Payer: 59 | Admitting: Internal Medicine

## 2022-06-06 VITALS — BP 120/82 | HR 80 | Ht 62.0 in | Wt 153.0 lb

## 2022-06-06 DIAGNOSIS — M8580 Other specified disorders of bone density and structure, unspecified site: Secondary | ICD-10-CM

## 2022-06-06 DIAGNOSIS — E559 Vitamin D deficiency, unspecified: Secondary | ICD-10-CM

## 2022-06-06 DIAGNOSIS — E210 Primary hyperparathyroidism: Secondary | ICD-10-CM

## 2022-06-06 LAB — VITAMIN D 25 HYDROXY (VIT D DEFICIENCY, FRACTURES): VITD: 41.71 ng/mL (ref 30.00–100.00)

## 2022-06-06 MED ORDER — VITAMIN D (ERGOCALCIFEROL) 1.25 MG (50000 UNIT) PO CAPS: ORAL_CAPSULE | ORAL | 3 refills | Status: DC

## 2022-06-06 NOTE — Progress Notes (Addendum)
Patient ID: KYI AREY, female   DOB: June 29, 1960, 62 y.o.   MRN: [id number]   HPI  Elizabeth Meyer is a 62 y.o.-year-old female, initially referred by Dr Tamala Julian, returning for follow-up for history of primary hyperparathyroidism, vitamin D deficiency, and osteopenia.  Last visit 1 year ago.  Interim history: At last visit, she was not going to the gym due to plantars fasciitis and being non weightbearing.  This resolved since last visit.  She restarted exercising, now with a trainer, since the beginning of the year. She had a bone density scan in 12/2021.  Dr. Ophelia Charter recommended Fosamax, however, she developed generalized muscle aches, which resolved after stopping the medication.  She started Boniva once a month approximately 2 months ago.  She tolerates this well.  Reviewed history: Patient was found to have an elevated calcium during investigation by Dr. Tamala Julian, whom she was seeing for joint pain (hips, knees).  A PTH level was also found to be high.  She was referred to endocrinology for further investigation.  Reviewed pertinent labs: Lab Results  Component Value Date   PTH 35 06/25/2019   PTH 32 05/25/2018   PTH Comment 05/25/2018   PTH 98 (H) 10/07/2017   PTH 121 (H) 09/09/2017   CALCIUM 9.1 05/28/2022   CALCIUM 8.8 02/19/2022   CALCIUM 9.4 11/09/2021   CALCIUM 9.2 05/31/2021   CALCIUM 9.5 06/19/2020   CALCIUM 8.9 04/04/2020   CALCIUM 9.6 12/16/2019   CALCIUM 9.1 06/25/2019   CALCIUM 8.9 02/11/2019   CALCIUM 9.0 01/29/2019  Postop, calcium was normal, at 9.4, and PTH greatly decreased, at 30.  She recently had osteoporosis on the latest bone density scan, previous osteopenia:  Lumbar spine L1-L4 (L2) Femoral neck (FN) 33% distal radius frax  12/16/2021 ([facility] -2.5 RFN: -1.5 LFN: -1.2 -2.6 N/a  09/02/2019 ([facility] -2.4 (-0.1%) RFN: -1.0 LFN: -0.9 -2.5 N/a  07/31/2017 ([facility]) -2.4 (-10.7%*) RFN: -1.3 LFN: -0.8 n/a 10-year  fracture risk 5.9% 10-year hip fracture risk: 0.3%  02/24/2014 -1.5 N/a n/a N/a  *Statistically significant difference  No clear in comparison between the hip bone mineral densities between 2019 and 2015.  It looks that the bone density have decreased but I cannot tell whether it is the total hip BMD's that were compared.    Osteoporosis medicines: - Fosamax 01/2022 >> generalized muscle aches - Boniva monthly - started 2 mo ago - no SEs  She had a high calcitriol level but normal magnesium and phosphorus: Component     Latest Ref Rng & Units 10/07/2017          Vitamin D 1, 25 (OH) Total     18 - 72 pg/mL 84 (H)  Vitamin D3 1, 25 (OH)     pg/mL 41  Vitamin D2 1, 25 (OH)     pg/mL 43  Magnesium     1.5 - 2.5 mg/dL 2.2  Phosphorus     2.3 - 4.6 mg/dL 2.9   24-hour urine calcium was normal but at the upper limit of normal: Component     Latest Ref Rng & Units 10/15/2017  Creatinine, 24H Ur     0.50 - 2.15 g/24 h 0.78  Calcium, 24H Urine     mg/24 h 198   No history of kidney stones.  Thyroid ultrasound (11/13/2017): 1 x 0.9 x 0.6 cm solid/almost completely solid, hypoechoic nodule in the right mid gland, but no sign of parathyroid adenoma.  Indication  was for repeat ultrasound in a year.  Technetium sestamibi parathyroid scan (11/13/2017): Was negative for parathyroid adenoma  4D CT of the neck (12/04/2017): No abnormal left-sided finding. On the right, there appears to be relative enlargement of the superior parathyroid gland (marked with double arrows), but this retains a somewhat flattened appearance. This could represent an early adenoma or hyperplasia. Below that on the right, there are 2 small nodular foci at the tracheoesophageal groove which do not show enhancement on arterial phase and therefore more consistent with small nonpathologic lymph nodes.  She saw Dr. Harlow Asa and had a right superior parathyroidectomy 01/13/2018.   Pathology showed an adenoma of 0.25 g (1.5 x  0.9 x 0.2 cm). Postoperatively, calcium was normal at 9.4 and PTH was greatly decreased, at 30.  No history of CKD. Last BUN/Cr: Lab Results  Component Value Date   BUN 13 05/28/2022   BUN 16 02/19/2022   CREATININE 0.79 05/28/2022   CREATININE 0.76 02/19/2022   She has a history of vitamin D deficiency.  Latest levels were normal: Lab Results  Component Value Date   VD25OH 48.40 05/31/2021   VD25OH 41.85 04/13/2020   VD25OH 81 12/16/2019   VD25OH 50.94 06/25/2019   VD25OH 33.13 05/25/2018   VD25OH 34.95 09/09/2017   On ergocalciferol 50,000 units weekly.  Latest TSH was normal but she has a history of TSH levels in the low normal range: Lab Results  Component Value Date   TSH 0.53 11/09/2021   No family history of pituitary tumors, thyroid cancer, or osteoporosis.  Her daughter and her mother  have had kidney stones.   She has a history of carpal tunnel surgeries bilaterally. She has back pain-herniated disc. She also has HTN. She has a history of iron deficiency anemia.  ROS: + see HPI  I reviewed pt's medications, allergies, PMH, social hx, family hx, and changes were documented in the history of present illness. Otherwise, unchanged from my initial visit note.  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   Degenerative arthritis of knee, bilateral 05/06/2019   Elevated LDL cholesterol level 06/17/2017   Endometrial polyp    Essential hypertension 01/29/2019   Heart murmur Dr Johnsie Cancel   dx 2012--  Benign--  per echo abnormal mitral inflow   History of gastrointestinal ulcer    Hyperparathyroidism, primary ([place]) 01/11/2018   IBS (irritable bowel syndrome)    Incomplete right bundle branch block 04/17/2020   Joint swelling 02/11/2019   Loss of transverse plantar arch 07/27/2014   Low back pain 09/09/2017   Lower  extremity edema 02/11/2019   Nasal congestion 04/17/2020   Nonallopathic lesion of cervical region 06/10/2018   Nonallopathic lesion of lumbosacral region 06/10/2018   Nonallopathic lesion of rib cage 06/10/2018   Nonallopathic lesion of sacral region 06/10/2018   Nonallopathic lesion of thoracic region 06/10/2018   Palpitations 04/17/2020   Patellofemoral syndrome of both knees 12/19/2016   Inj 10/30 bilateral Bilateral injection September 09, 2017 repeat injections in March 17, 2018 August 06, 2018 Durolane approved 01/15/2019.  Injected January 28, 2019   Pharyngitis 06/21/2014   Plantar fasciitis, right 07/27/2014   Post-menopausal 06/17/2017   Seasonal allergies    Sinusitis, acute frontal 06/03/2014   Suprapubic pain 05/30/2017   Wears contact lenses    Past Surgical History:  Procedure  Laterality Date   CARPAL TUNNEL RELEASE Left 05/15/2017   Procedure: LEFT CARPAL TUNNEL RELEASE;  Surgeon: [name], MD;  Location: [place];  Service: Orthopedics;  Laterality: Left;   COLONOSCOPY     COLONOSCOPY WITH PROPOFOL  last one 04-28-2014   CYSTOSCOPY N/A 06/06/2014   Procedure: CYSTOSCOPY;  Surgeon: [name], MD;  Location: [facility];  Service: Gynecology;  Laterality: N/A;   DILATATION & CURRETTAGE/HYSTEROSCOPY WITH RESECTOCOPE N/A 06/06/2014   Procedure: DILATATION & CURETTAGE/HYSTEROSCOPY WITH RESECTOCOPE;  Surgeon: [name], MD;  Location: [place];  Service: Gynecology;  Laterality: N/A;   LAPAROSCOPIC SALPINGO OOPHERECTOMY Left 06/06/2014   Procedure: LAPAROSCOPIC LEFT SALPINGO OOPHORECTOMY;  Surgeon: [name], MD;  Location: [place];  Service: Gynecology;  Laterality: Left;   PARATHYROIDECTOMY Right 01/13/2018   Procedure: RIGHT SUPERIOR PARATHYROIDECTOMY;  Surgeon: Armandina Gemma, MD;  Location: [facility];  Service: General;  Laterality: Right;   PLANTAR FASCIA SURGERY Left 2009   topaz procedure   TRANSTHORACIC  ECHOCARDIOGRAM  08-12-2007   normal LV,  ef 65-70%,  abnormal mitral inflow with trivial MR,  trivial PR and TR   Social History   Socioeconomic History   Marital status: Married    Spouse name: Not on file   Number of children: 2   Years of education: Not on file   Highest education level: Not on file  Occupational History   Occupation: preschool teacher    Employer: [employer]  Tobacco Use   Smoking status: Never   Smokeless tobacco: Never  Vaping Use   Vaping Use: Never used  Substance and Sexual Activity   Alcohol use: Yes    Comment: occasional   Drug use: Never   Sexual activity: Yes    Birth control/protection: Post-menopausal  Other Topics Concern   Not on file  Social History Narrative   Married, Aeronautical engineer 2 daughters   1-2 caffeinated beverages/day   Social Determinants of Health   Financial Resource Strain: Not on file  Food Insecurity: Not on file  Transportation Needs: Not on file  Physical Activity: Not on file  Stress: Not on file  Social Connections: Not on file  Intimate Partner Violence: Not on file   Current Outpatient Medications on File Prior to Visit  Medication Sig Dispense Refill   alendronate (FOSAMAX) 70 MG tablet Take 1 tablet (70 mg total) by mouth every 7 (seven) days. Take with a full glass of water on an empty stomach. 4 tablet 11   cetirizine (ZYRTEC) 10 MG tablet Take 1 tablet (10 mg total) by mouth 2 (two) times daily as needed for allergies (Can use an extra dose during flare ups). 180 tablet 1   clotrimazole-betamethasone (LOTRISONE) cream Apply 1 application topically 2 (two) times daily. For 2 weeks 60 g 0   famotidine (PEPCID) 40 MG tablet TAKE 1 TABLET BY MOUTH EVERY DAY 90 tablet 3   gabapentin (NEURONTIN) 100 MG capsule TAKE 2 CAPSULES BY MOUTH AT BEDTIME. 180 capsule 2   lisinopril-hydrochlorothiazide (ZESTORETIC) 10-12.5 MG tablet Take 1 tablet by mouth daily. 90 tablet 1   Melatonin 5 MG CAPS Take 10 mg by mouth as  needed.     montelukast (SINGULAIR) 10 MG tablet Take 1 tablet (10 mg total) by mouth at bedtime. 30 tablet 11   potassium chloride (KLOR-CON) 10 MEQ tablet TAKE 2 TABLETS BY MOUTH EVERY DAY 180 tablet 0   traZODone (DESYREL) 100 MG tablet  TAKE 1 TABLET BY MOUTH EVERY DAY AT BEDTIME AS NEEDED FOR SLEEP 90 tablet 1   Vitamin D, Ergocalciferol, (DRISDOL) 1.25 MG (50000 UNIT) CAPS capsule Take 1 capsule by mouth weekly 15 capsule 3   No current facility-administered medications on file prior to visit.   No Known Allergies  Family History  Problem Relation Age of Onset   Allergic rhinitis Mother    Glaucoma Mother    Hypertension Mother    Hyperlipidemia Mother    Heart disease Mother    Arthritis-Osteo Father    Tuberculosis Maternal Grandfather    Diabetes Maternal Grandfather    Asthma Paternal Grandmother    Dementia Paternal Grandmother    Heart disease Paternal Grandfather    Tuberculosis Other    Colon cancer Neg Hx    Stomach cancer Neg Hx    Esophageal cancer Neg Hx    Pancreatic cancer Neg Hx     PE: BP 120/82 (BP Location: Left Arm, Patient Position: Sitting, Cuff Size: Normal)   Pulse 80   Ht '5\' 2"'$  (1.575 m)   Wt 153 lb (69.4 kg)   LMP 03/30/2016   SpO2 99%   BMI 27.98 kg/m  Wt Readings from Last 3 Encounters:  06/06/22 153 lb (69.4 kg)  03/12/22 151 lb (68.5 kg)  11/13/21 152 lb 9.6 oz (69.2 kg)   Constitutional: normal weight, in NAD Eyes: PERRLA, EOMI, no exophthalmos ENT: moist mucous membranes, no thyromegaly, no cervical lymphadenopathy Cardiovascular: RRR, No MRG Respiratory: CTA B Musculoskeletal: no deformities Skin: moist, warm, no rashes Neurological: no tremor with outstretched hands  Assessment: 1. Hypercalcemia/hyperparathyroidism  2.  History of vitamin D deficiency  3.  Osteopenia  4.  Thyroid nodule  Plan: Patient with history of slightly elevated calcium and also elevated PTH level.  High ACTH was 121.  She also had a history of  vitamin D deficiency and at last visit was on replacement with ergocalciferol weekly.  Latest level was reviewed and was normal. -She has no apparent complications from hypercalcemia: No history of nephrolithiasis, no osteoporosis (however, she does have osteopenia), no fractures.  She denied abdominal pain, depression, bone pain.  She did have joint pains for which she was seen Dr. Tamala Julian. -She had extensive investigation with neck ultrasound, parathyroid scan and 4D CT in the last test finally revealed a parathyroid adenoma -She had parathyroidectomy on 01/13/2018 and this was successful in extracting the 0.25 g right superior adenomatous parathyroid -Calcium and PTH obtained after surgery were normal.  Another calcium level was normal 9 days ago.  We will not repeat this today  2.  History of vitamin D deficiency -On weekly ergocalciferol -Vitamin D level was normal at last visit: Lab Results  Component Value Date   VD25OH 48.40 05/31/2021  - we will recheck the level today and adjust the ergocalciferol dose if needed.  3.  Osteoporosis -Based on the T-score at the level of the 33% distal radius. -We reviewed together the latest bone density scan from 12/2021: T-scores appear to be significantly lower at the femoral necks and slightly lower at the 33% distal radius, consistent with the hyperparathyroidism effect on the cortical bone -she tried Fosamax  but developed generalized muscle aches so she is now on Boniva, tolerated well -I again suggested weight bearing exercises and gave her the NO[facility] recommended exercise regimen.  She had back pain with herniated the disc and sciatica at last visit along so she was not exercising but these resolved.  She is now exercising with a personal trainer -will repeat a bone density scan next year if covered by insurance  4.  Thyroid nodule -No neck compression symptoms -TSH was normal at last check on 11/09/2021 -Thyroid ultrasound report from 08/2018 showed  a small nodule, not worrisome -No follow-up needed unless she develops neck compression symptoms or masses are felt on palpation of her neck  Needs refills ergocalciferol.  Component     Latest Ref Rng 06/06/2022  VITD     30.00 - 100.00 ng/mL 41.71   Normal level.  Philemon Kingdom, MD PhD [facility] Endocrinology

## 2022-06-06 NOTE — Patient Instructions (Signed)
Please continue vitamin D 50,000 units daily  Please ask your OB/GYN doctor to order another bone density scan in 12/2021.  Please stop at the lab.  Please come back for a follow-up appointment in 1 year.

## 2022-06-06 NOTE — Addendum Note (Signed)
Addended by: Philemon Kingdom on: 06/06/2022 04:15 PM   Modules accepted: Orders

## 2022-06-26 ENCOUNTER — Other Ambulatory Visit: Payer: Self-pay | Admitting: Family Medicine

## 2022-07-10 ENCOUNTER — Other Ambulatory Visit: Payer: Self-pay | Admitting: *Deleted

## 2022-07-10 MED ORDER — MONTELUKAST SODIUM 10 MG PO TABS: 10.0000 mg | ORAL_TABLET | Freq: Every day | ORAL | 0 refills | Status: DC

## 2022-07-23 LAB — HM MAMMOGRAPHY

## 2022-07-23 NOTE — Progress Notes (Unsigned)
[facility] [address] [place] [address] Phone: [phone] Subjective:   [name], am serving as a scribe for Dr. Antoine Primas.  I'm seeing this patient by the request  of:  Donato Schultz, DO  CC: Knee pain follow-up  [name]  Elizabeth [facility] is a 62 y.o. female coming in with complaint of knee pain. Last seen for knee pain in 2021. Patient states L knee pain on the lateral side. Wants to know if same arthritis or something new.       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   Degenerative arthritis of knee, bilateral 05/06/2019   Elevated LDL cholesterol level 06/17/2017   Endometrial polyp    Essential hypertension 01/29/2019   Heart murmur Dr Eden Emms   dx 2012--  Benign--  per echo abnormal mitral inflow   History of gastrointestinal ulcer    Hyperparathyroidism, primary (HCC) 01/11/2018   IBS (irritable bowel syndrome)    Incomplete right bundle branch block 04/17/2020   Joint swelling 02/11/2019   Loss of transverse plantar arch 07/27/2014   Low back pain 09/09/2017   Lower extremity edema 02/11/2019   Nasal congestion 04/17/2020   Nonallopathic lesion of cervical region 06/10/2018   Nonallopathic lesion of lumbosacral region 06/10/2018   Nonallopathic lesion of rib cage 06/10/2018   Nonallopathic lesion of sacral region 06/10/2018   Nonallopathic lesion of thoracic region 06/10/2018   Palpitations 04/17/2020   Patellofemoral syndrome of both knees 12/19/2016   Inj 10/30 bilateral Bilateral injection September 09, 2017 repeat injections in March 17, 2018 August 06, 2018 Durolane approved 01/15/2019.  Injected January 28, 2019   Pharyngitis 06/21/2014   Plantar fasciitis, right 07/27/2014   Post-menopausal 06/17/2017   Seasonal allergies    Sinusitis, acute frontal 06/03/2014    Suprapubic pain 05/30/2017   Wears contact lenses    Past Surgical History:  Procedure Laterality Date   CARPAL TUNNEL RELEASE Left 05/15/2017   Procedure: LEFT CARPAL TUNNEL RELEASE;  Surgeon: Cindee Salt, MD;  Location: [facility];  Service: Orthopedics;  Laterality: Left;   COLONOSCOPY     COLONOSCOPY WITH PROPOFOL  last one 04-28-2014   CYSTOSCOPY N/A 06/06/2014   Procedure: CYSTOSCOPY;  Surgeon: Juluis Mire, MD;  Location: [facility];  Service: Gynecology;  Laterality: N/A;   DILATATION & CURRETTAGE/HYSTEROSCOPY WITH RESECTOCOPE N/A 06/06/2014   Procedure: DILATATION & CURETTAGE/HYSTEROSCOPY WITH RESECTOCOPE;  Surgeon: Juluis Mire, MD;  Location: [facility];  Service: Gynecology;  Laterality: N/A;   LAPAROSCOPIC SALPINGO OOPHERECTOMY Left 06/06/2014   Procedure: LAPAROSCOPIC LEFT SALPINGO OOPHORECTOMY;  Surgeon: Juluis Mire, MD;  Location: [facility];  Service: Gynecology;  Laterality: Left;   PARATHYROIDECTOMY Right 01/13/2018   Procedure: RIGHT SUPERIOR PARATHYROIDECTOMY;  Surgeon: Darnell Level, MD;  Location: [facility];  Service: General;  Laterality: Right;   PLANTAR FASCIA SURGERY Left 2009   topaz procedure   TRANSTHORACIC ECHOCARDIOGRAM  08-12-2007   normal LV,  ef 65-70%,  abnormal mitral inflow with trivial MR,  trivial PR and TR   Social History   Socioeconomic History   Marital status: Married    Spouse name: Not on file   Number of children: 2   Years of education: Not on file  Highest education level: Not on file  Occupational History   Occupation: preschool teacher    Employer: [employer]  Tobacco Use   Smoking status: Never   Smokeless tobacco: Never  Vaping Use   Vaping Use: Never used  Substance and Sexual Activity   Alcohol use: Yes    Comment: occasional   Drug use: Never   Sexual activity: Yes    Birth control/protection: Post-menopausal  Other Topics Concern   Not on file  Social  History Narrative   Married, Administrator 2 daughters   1-2 caffeinated beverages/day   Social Determinants of Health   Financial Resource Strain: Not on file  Food Insecurity: Not on file  Transportation Needs: Not on file  Physical Activity: Not on file  Stress: Not on file  Social Connections: Not on file   No Known Allergies Family History  Problem Relation Age of Onset   Allergic rhinitis Mother    Glaucoma Mother    Hypertension Mother    Hyperlipidemia Mother    Heart disease Mother    Arthritis-Osteo Father    Tuberculosis Maternal Grandfather    Diabetes Maternal Grandfather    Asthma Paternal Grandmother    Dementia Paternal Grandmother    Heart disease Paternal Grandfather    Tuberculosis Other    Colon cancer Neg Hx    Stomach cancer Neg Hx    Esophageal cancer Neg Hx    Pancreatic cancer Neg Hx     Current Outpatient Medications (Endocrine & Metabolic):    alendronate (FOSAMAX) 70 MG tablet, Take 1 tablet (70 mg total) by mouth every 7 (seven) days. Take with a full glass of water on an empty stomach.  Current Outpatient Medications (Cardiovascular):    lisinopril-hydrochlorothiazide (ZESTORETIC) 10-12.5 MG tablet, Take 1 tablet by mouth daily.  Current Outpatient Medications (Respiratory):    cetirizine (ZYRTEC) 10 MG tablet, Take 1 tablet (10 mg total) by mouth 2 (two) times daily as needed for allergies (Can use an extra dose during flare ups).   montelukast (SINGULAIR) 10 MG tablet, Take 1 tablet (10 mg total) by mouth at bedtime.  Current Outpatient Medications (Analgesics):    meloxicam (MOBIC) 15 MG tablet, Take 1 tablet (15 mg total) by mouth daily.   Current Outpatient Medications (Other):    clotrimazole-betamethasone (LOTRISONE) cream, Apply 1 application topically 2 (two) times daily. For 2 weeks   famotidine (PEPCID) 40 MG tablet, TAKE 1 TABLET BY MOUTH EVERY DAY   gabapentin (NEURONTIN) 100 MG capsule, TAKE 2 CAPSULES BY MOUTH AT  BEDTIME.   Melatonin 5 MG CAPS, Take 10 mg by mouth as needed.   potassium chloride (KLOR-CON) 10 MEQ tablet, TAKE 2 TABLETS BY MOUTH EVERY DAY   traZODone (DESYREL) 100 MG tablet, TAKE 1 TABLET BY MOUTH AT BEDTIME AS NEEDED FOR SLEEP   Vitamin D, Ergocalciferol, (DRISDOL) 1.25 MG (50000 UNIT) CAPS capsule, Take 1 capsule by mouth weekly   Reviewed prior external information including notes and imaging from  primary care provider As well as notes that were available from care everywhere and other healthcare systems.  Past medical history, social, surgical and family history all reviewed in electronic medical record.  No pertanent information unless stated regarding to the chief complaint.   Review of Systems:  No headache, visual changes, nausea, vomiting, diarrhea, constipation, dizziness, abdominal pain, skin rash, fevers, chills, night sweats, weight loss, swollen lymph nodes, body aches, joint swelling, chest pain, shortness of breath, mood changes. POSITIVE muscle aches  Objective  Blood pressure 104/64, pulse 90, height  (1.575 m), weight 150 lb (68 kg), last menstrual period 03/30/2016, SpO2 97 %.   General: No apparent distress alert and oriented x3 mood and affect normal, dressed appropriately.  HEENT: Pupils equal, extraocular movements intact  Respiratory: Patient's speak in full sentences and does not appear short of breath  Cardiovascular: No lower extremity edema, non tender, no erythema  Left knee does have some crepitus noted, lateral tracking of the patella noted.  Very minimal pain over the medial joint line but definitely a positive patellar grind test noted.   Limited muscular skeletal ultrasound was performed and interpreted by Antoine Primas, M  Limited ultrasound does show that patient does have some narrowing of the patellofemoral joint.  Patient does have hypoechoic changes in this area consistent with a small effusion.  Patient's medial meniscus mild  displacement but no true acute tear is appreciated. Impression: Patellofemoral arthritis  After informed written and verbal consent, patient was seated on exam table. Left knee was prepped with alcohol swab and utilizing anterolateral approach, patient's left knee space was injected with 4:1  marcaine 0.5%: Kenalog /dL. Patient tolerated the procedure well without immediate complications. Impression and Recommendations:     The above documentation has been reviewed and is accurate and complete Judi Saa, DO

## 2022-07-24 ENCOUNTER — Ambulatory Visit (INDEPENDENT_AMBULATORY_CARE_PROVIDER_SITE_OTHER): Payer: 59

## 2022-07-24 ENCOUNTER — Encounter: Payer: Self-pay | Admitting: Family Medicine

## 2022-07-24 ENCOUNTER — Ambulatory Visit (INDEPENDENT_AMBULATORY_CARE_PROVIDER_SITE_OTHER): Payer: 59 | Admitting: Family Medicine

## 2022-07-24 ENCOUNTER — Ambulatory Visit: Payer: Self-pay

## 2022-07-24 VITALS — BP 104/64 | HR 90 | Ht 62.0 in | Wt 150.0 lb

## 2022-07-24 DIAGNOSIS — G8929 Other chronic pain: Secondary | ICD-10-CM

## 2022-07-24 DIAGNOSIS — M170 Bilateral primary osteoarthritis of knee: Secondary | ICD-10-CM

## 2022-07-24 DIAGNOSIS — M222X1 Patellofemoral disorders, right knee: Secondary | ICD-10-CM

## 2022-07-24 DIAGNOSIS — M25562 Pain in left knee: Secondary | ICD-10-CM

## 2022-07-24 DIAGNOSIS — M222X2 Patellofemoral disorders, left knee: Secondary | ICD-10-CM

## 2022-07-24 MED ORDER — MELOXICAM 15 MG PO TABS: 15.0000 mg | ORAL_TABLET | Freq: Every day | ORAL | 0 refills | Status: DC

## 2022-07-24 NOTE — Assessment & Plan Note (Signed)
Once again I do feel that there is an underlying arthritic changes of the knee that is contributing.  Try the injection today, could be a candidate for viscosupplementation and we will try to get approval.  Continue to work on VMO strengthening.

## 2022-07-24 NOTE — Assessment & Plan Note (Signed)
Chronic problem with worsening symptoms.  Discussed there is likely some underlying arthritis as well.  Will get approval for viscosupplementation.  Do believe that the underlying arthritis is contributing to some of the discomfort and pain.  Discussed icing regimen and home exercises.  Continue to work on [employer].  Follow-up with me again in 6 to 8 weeks otherwise.

## 2022-07-24 NOTE — Patient Instructions (Addendum)
Xray today Injection today  Refresher of exercises Ice 20 minutes 2 times daily. Usually after activity and before bed. Meloxicam daily for 10 days then as needed See me again before endo of April. Ok to double book if needed

## 2022-08-01 ENCOUNTER — Other Ambulatory Visit: Payer: Self-pay | Admitting: Family Medicine

## 2022-08-01 DIAGNOSIS — I10 Essential (primary) hypertension: Secondary | ICD-10-CM

## 2022-08-01 NOTE — Progress Notes (Signed)
[name] [address] [place] [address] Phone: [phone] Subjective:   [facility], am serving as a scribe for Dr. Antoine Primas.  I'm seeing this patient by the request  of:  Donato Schultz, DO  CC: bilateral knee pain   [name]  Elizabeth [facility] is a 62 y.o. female coming in with complaint of B knee pain. Patient states that injection helped somewhat but not as much as improvement as she hoped. 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   Degenerative arthritis of knee, bilateral 05/06/2019   Elevated LDL cholesterol level 06/17/2017   Endometrial polyp    Essential hypertension 01/29/2019   Heart murmur Dr Eden Emms   dx 2012--  Benign--  per echo abnormal mitral inflow   History of gastrointestinal ulcer    Hyperparathyroidism, primary (HCC) 01/11/2018   IBS (irritable bowel syndrome)    Incomplete right bundle branch block 04/17/2020   Joint swelling 02/11/2019   Loss of transverse plantar arch 07/27/2014   Low back pain 09/09/2017   Lower extremity edema 02/11/2019   Nasal congestion 04/17/2020   Nonallopathic lesion of cervical region 06/10/2018   Nonallopathic lesion of lumbosacral region 06/10/2018   Nonallopathic lesion of rib cage 06/10/2018   Nonallopathic lesion of sacral region 06/10/2018   Nonallopathic lesion of thoracic region 06/10/2018   Palpitations 04/17/2020   Patellofemoral syndrome of both knees 12/19/2016   Inj 10/30 bilateral Bilateral injection September 09, 2017 repeat injections in March 17, 2018 August 06, 2018 Durolane approved 01/15/2019.  Injected January 28, 2019   Pharyngitis 06/21/2014   Plantar fasciitis, right 07/27/2014   Post-menopausal 06/17/2017   Seasonal allergies    Sinusitis, acute frontal 06/03/2014   Suprapubic  pain 05/30/2017   Wears contact lenses    Past Surgical History:  Procedure Laterality Date   CARPAL TUNNEL RELEASE Left 05/15/2017   Procedure: LEFT CARPAL TUNNEL RELEASE;  Surgeon: Cindee Salt, MD;  Location: [facility];  Service: Orthopedics;  Laterality: Left;   COLONOSCOPY     COLONOSCOPY WITH PROPOFOL  last one 04-28-2014   CYSTOSCOPY N/A 06/06/2014   Procedure: CYSTOSCOPY;  Surgeon: Juluis Mire, MD;  Location: [facility];  Service: Gynecology;  Laterality: N/A;   DILATATION & CURRETTAGE/HYSTEROSCOPY WITH RESECTOCOPE N/A 06/06/2014   Procedure: DILATATION & CURETTAGE/HYSTEROSCOPY WITH RESECTOCOPE;  Surgeon: Juluis Mire, MD;  Location: [facility];  Service: Gynecology;  Laterality: N/A;   LAPAROSCOPIC SALPINGO OOPHERECTOMY Left 06/06/2014   Procedure: LAPAROSCOPIC LEFT SALPINGO OOPHORECTOMY;  Surgeon: Juluis Mire, MD;  Location: [facility];  Service: Gynecology;  Laterality: Left;   PARATHYROIDECTOMY Right 01/13/2018   Procedure: RIGHT SUPERIOR PARATHYROIDECTOMY;  Surgeon: Darnell Level, MD;  Location: [facility];  Service: General;  Laterality: Right;   PLANTAR FASCIA SURGERY Left 2009   topaz procedure   TRANSTHORACIC ECHOCARDIOGRAM  08-12-2007   normal LV,  ef 65-70%,  abnormal mitral inflow with trivial MR,  trivial PR and TR   Social History   Socioeconomic History   Marital status: Married    Spouse name: Not on file   Number of children: 2   Years of education: Not on file   Highest education level:  Not on file  Occupational History   Occupation: preschool teacher    Employer: [employer]  Tobacco Use   Smoking status: Never   Smokeless tobacco: Never  Vaping Use   Vaping Use: Never used  Substance and Sexual Activity   Alcohol use: Yes    Comment: occasional   Drug use: Never   Sexual activity: Yes    Birth control/protection: Post-menopausal  Other Topics Concern   Not on file  Social History Narrative    Married, Administrator 2 daughters   1-2 caffeinated beverages/day   Social Determinants of Health   Financial Resource Strain: Not on file  Food Insecurity: Not on file  Transportation Needs: Not on file  Physical Activity: Not on file  Stress: Not on file  Social Connections: Not on file   No Known Allergies Family History  Problem Relation Age of Onset   Allergic rhinitis Mother    Glaucoma Mother    Hypertension Mother    Hyperlipidemia Mother    Heart disease Mother    Arthritis-Osteo Father    Tuberculosis Maternal Grandfather    Diabetes Maternal Grandfather    Asthma Paternal Grandmother    Dementia Paternal Grandmother    Heart disease Paternal Grandfather    Tuberculosis Other    Colon cancer Neg Hx    Stomach cancer Neg Hx    Esophageal cancer Neg Hx    Pancreatic cancer Neg Hx     Current Outpatient Medications (Endocrine & Metabolic):    ibandronate (BONIVA) 150 MG tablet, TAKE 1 TABLET BY MOUTH EVERY MONTH  Current Outpatient Medications (Cardiovascular):    lisinopril-hydrochlorothiazide (ZESTORETIC) 10-12.5 MG tablet, Take 1 tablet by mouth daily.  Current Outpatient Medications (Respiratory):    cetirizine (ZYRTEC) 10 MG tablet, Take 1 tablet (10 mg total) by mouth 2 (two) times daily as needed for allergies (Can use an extra dose during flare ups).   montelukast (SINGULAIR) 10 MG tablet, Take 1 tablet (10 mg total) by mouth at bedtime.  Current Outpatient Medications (Analgesics):    meloxicam (MOBIC) 15 MG tablet, Take 1 tablet (15 mg total) by mouth daily.   Current Outpatient Medications (Other):    clotrimazole-betamethasone (LOTRISONE) cream, Apply 1 application topically 2 (two) times daily. For 2 weeks   famotidine (PEPCID) 40 MG tablet, TAKE 1 TABLET BY MOUTH EVERY DAY   gabapentin (NEURONTIN) 100 MG capsule, TAKE 2 CAPSULES BY MOUTH AT BEDTIME.   Melatonin 5 MG CAPS, Take 10 mg by mouth as needed.   potassium chloride (KLOR-CON)  10 MEQ tablet, Take 2 tablets (20 mEq total) by mouth daily.   traZODone (DESYREL) 100 MG tablet, TAKE 1 TABLET BY MOUTH AT BEDTIME AS NEEDED FOR SLEEP   Vitamin D, Ergocalciferol, (DRISDOL) 1.25 MG (50000 UNIT) CAPS capsule, Take 1 capsule by mouth weekly   Reviewed prior external information including notes and imaging from  primary care provider As well as notes that were available from care everywhere and other healthcare systems.  Past medical history, social, surgical and family history all reviewed in electronic medical record.  No pertanent information unless stated regarding to the chief complaint.   Review of Systems:  No headache, visual changes, nausea, vomiting, diarrhea, constipation, dizziness, abdominal pain, skin rash, fevers, chills, night sweats, weight loss, swollen lymph nodes, body aches, joint swelling, chest pain, shortness of breath, mood changes. POSITIVE muscle aches  Objective  Blood pressure 110/82, pulse 71, height 5\' 2"  (1.575 m), weight 149 lb (67.6 kg), last  menstrual period 03/30/2016, SpO2 96 %.   General: No apparent distress alert and oriented x3 mood and affect normal, dressed appropriately.  HEENT: Pupils equal, extraocular movements intact  Respiratory: Patient's speak in full sentences and does not appear short of breath  Cardiovascular: No lower extremity edema, non tender, no erythema  Instability of the knee mostly of the patellofemoral joint.  After informed written and verbal consent, patient was seated on exam table. Left knee was prepped with alcohol swab and utilizing anterolateral approach, patient's left knee space was injected with 60 mg per 3 mL of Durolane (sodium hyaluronate) in a prefilled syringe was injected easily into the knee through a 22-gauge needle..Patient tolerated the procedure well without immediate complications.  This was a sample provided by patient     Impression and Recommendations:     The above documentation has  been reviewed and is accurate and complete Judi Saa, DO

## 2022-08-05 ENCOUNTER — Ambulatory Visit (INDEPENDENT_AMBULATORY_CARE_PROVIDER_SITE_OTHER): Payer: 59 | Admitting: Family Medicine

## 2022-08-05 ENCOUNTER — Encounter: Payer: Self-pay | Admitting: Family Medicine

## 2022-08-05 VITALS — BP 112/80 | HR 64 | Temp 98.5°F | Resp 18 | Ht 62.0 in | Wt 150.8 lb

## 2022-08-05 DIAGNOSIS — J302 Other seasonal allergic rhinitis: Secondary | ICD-10-CM

## 2022-08-05 DIAGNOSIS — I10 Essential (primary) hypertension: Secondary | ICD-10-CM | POA: Diagnosis not present

## 2022-08-05 DIAGNOSIS — E039 Hypothyroidism, unspecified: Secondary | ICD-10-CM

## 2022-08-05 DIAGNOSIS — E785 Hyperlipidemia, unspecified: Secondary | ICD-10-CM | POA: Diagnosis not present

## 2022-08-05 DIAGNOSIS — Z0000 Encounter for general adult medical examination without abnormal findings: Secondary | ICD-10-CM

## 2022-08-05 DIAGNOSIS — E210 Primary hyperparathyroidism: Secondary | ICD-10-CM

## 2022-08-05 MED ORDER — POTASSIUM CHLORIDE ER 10 MEQ PO TBCR
20.0000 meq | EXTENDED_RELEASE_TABLET | Freq: Every day | ORAL | 1 refills | Status: DC
Start: 2022-08-05 — End: 2023-02-19

## 2022-08-05 MED ORDER — MONTELUKAST SODIUM 10 MG PO TABS: 10.0000 mg | ORAL_TABLET | Freq: Every day | ORAL | 3 refills | Status: DC

## 2022-08-05 NOTE — Assessment & Plan Note (Signed)
Stable with singulair.  

## 2022-08-05 NOTE — Assessment & Plan Note (Signed)
Lab Results  Component Value Date   TSH 0.53 11/09/2021   Check tsh Con't synthroid

## 2022-08-05 NOTE — Assessment & Plan Note (Signed)
Per endo °

## 2022-08-05 NOTE — Assessment & Plan Note (Signed)
Ghm utd Check labs  Health Maintenance  Topic Date Due   PAP SMEAR-Modifier  01/04/2017   COVID-19 Vaccine (4 - 2023-24 season) 12/07/2021   Zoster Vaccines- Shingrix (1 of 2) 11/04/2022 (Originally 01/17/2013)   HIV Screening  08/05/2023 (Originally 01/17/1976)   INFLUENZA VACCINE  11/07/2022   COLONOSCOPY (Pts 45-58yrs Insurance coverage will need to be confirmed)  04/28/2024   MAMMOGRAM  07/22/2024   DTaP/Tdap/Td (2 - Td or Tdap) 08/25/2031   Hepatitis C Screening  Completed   HPV VACCINES  Aged Out

## 2022-08-05 NOTE — Progress Notes (Signed)
Subjective:   By signing my name below, I, [name], attest that this documentation has been prepared under the direction and in the presence of Donato Schultz, DO 08/05/22   Patient ID: Elizabeth Meyer, female    DOB: July 26, 1960, 62 y.o.   MRN: [id number]  Chief Complaint  Patient presents with   Hypertension   Hyperlipidemia   Hypothyroidism   Follow-up    HPI Patient is in today for a follow up.   She is requesting a refill of Montelukast and potassium.   She reports her blood pressure was 106/62 last week during a follow up with Dr. Katrinka Blazing. She continued to monitor her blood pressure at home for a couple days after and reports her readings eventually leveled out.   She has a family history of hyperlipidemia. She had a coronary CT in 01/2022. Her calcium score was 0.  She follows up with Dr. Arelia Sneddon for gyn.   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   Degenerative arthritis of knee, bilateral 05/06/2019   Elevated LDL cholesterol level 06/17/2017   Endometrial polyp    Essential hypertension 01/29/2019   Heart murmur Dr Eden Emms   dx 2012--  Benign--  per echo abnormal mitral inflow   History of gastrointestinal ulcer    Hyperparathyroidism, primary (HCC) 01/11/2018   IBS (irritable bowel syndrome)    Incomplete right bundle branch block 04/17/2020   Joint swelling 02/11/2019   Loss of transverse plantar arch 07/27/2014   Low back pain 09/09/2017   Lower extremity edema 02/11/2019   Nasal congestion 04/17/2020   Nonallopathic lesion of cervical region 06/10/2018   Nonallopathic lesion of lumbosacral region 06/10/2018   Nonallopathic lesion of rib cage 06/10/2018   Nonallopathic lesion of sacral region 06/10/2018   Nonallopathic lesion of thoracic region 06/10/2018   Palpitations 04/17/2020   Patellofemoral syndrome of  both knees 12/19/2016   Inj 10/30 bilateral Bilateral injection September 09, 2017 repeat injections in March 17, 2018 August 06, 2018 Durolane approved 01/15/2019.  Injected January 28, 2019   Pharyngitis 06/21/2014   Plantar fasciitis, right 07/27/2014   Post-menopausal 06/17/2017   Seasonal allergies    Sinusitis, acute frontal 06/03/2014   Suprapubic pain 05/30/2017   Wears contact lenses     Past Surgical History:  Procedure Laterality Date   CARPAL TUNNEL RELEASE Left 05/15/2017   Procedure: LEFT CARPAL TUNNEL RELEASE;  Surgeon: Cindee Salt, MD;  Location: [facility];  Service: Orthopedics;  Laterality: Left;   COLONOSCOPY     COLONOSCOPY WITH PROPOFOL  last one 04-28-2014   CYSTOSCOPY N/A 06/06/2014   Procedure: CYSTOSCOPY;  Surgeon: Juluis Mire, MD;  Location: [facility] [address];  Service: Gynecology;  Laterality: N/A;   DILATATION & CURRETTAGE/HYSTEROSCOPY WITH RESECTOCOPE N/A 06/06/2014   Procedure: DILATATION & CURETTAGE/HYSTEROSCOPY WITH RESECTOCOPE;  Surgeon: Juluis Mire, MD;  Location: [facility];  Service: Gynecology;  Laterality: N/A;   LAPAROSCOPIC SALPINGO OOPHERECTOMY Left 06/06/2014   Procedure: LAPAROSCOPIC LEFT SALPINGO OOPHORECTOMY;  Surgeon: Juluis Mire, MD;  Location: [facility] ;  Service: Gynecology;  Laterality: Left;   PARATHYROIDECTOMY Right 01/13/2018   Procedure: RIGHT SUPERIOR PARATHYROIDECTOMY;  Surgeon: Darnell Level, MD;  Location: [facility];  Service: General;  Laterality: Right;   PLANTAR FASCIA SURGERY Left 2009  topaz procedure   TRANSTHORACIC ECHOCARDIOGRAM  08-12-2007   normal LV,  ef 65-70%,  abnormal mitral inflow with trivial MR,  trivial PR and TR    Family History  Problem Relation Age of Onset   Allergic rhinitis Mother    Glaucoma Mother    Hypertension Mother    Hyperlipidemia Mother    Heart disease Mother    Arthritis-Osteo Father    Tuberculosis Maternal Grandfather    Diabetes  Maternal Grandfather    Asthma Paternal Grandmother    Dementia Paternal Grandmother    Heart disease Paternal Grandfather    Tuberculosis Other    Colon cancer Neg Hx    Stomach cancer Neg Hx    Esophageal cancer Neg Hx    Pancreatic cancer Neg Hx     Social History   Socioeconomic History   Marital status: Married    Spouse name: Not on file   Number of children: 2   Years of education: Not on file   Highest education level: Not on file  Occupational History   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: [employer]  Tobacco Use   Smoking status: Never   Smokeless tobacco: Never  Vaping Use   Vaping Use: Never used  Substance and Sexual Activity   Alcohol use: Yes    Comment: occasional   Drug use: Never   Sexual activity: Yes    Birth control/protection: Post-menopausal  Other Topics Concern   Not on file  Social History Narrative   Married, Administrator 2 daughters   1-2 caffeinated beverages/day   Social Determinants of Health   Financial Resource Strain: Not on file  Food Insecurity: Not on file  Transportation Needs: Not on file  Physical Activity: Not on file  Stress: Not on file  Social Connections: Not on file  Intimate Partner Violence: Not on file    Outpatient Medications Prior to Visit  Medication Sig Dispense Refill   cetirizine (ZYRTEC) 10 MG tablet Take 1 tablet (10 mg total) by mouth 2 (two) times daily as needed for allergies (Can use an extra dose during flare ups). 180 tablet 1   clotrimazole-betamethasone (LOTRISONE) cream Apply 1 application topically 2 (two) times daily. For 2 weeks 60 g 0   famotidine (PEPCID) 40 MG tablet TAKE 1 TABLET BY MOUTH EVERY DAY 90 tablet 3   gabapentin (NEURONTIN) 100 MG capsule TAKE 2 CAPSULES BY MOUTH AT BEDTIME. 180 capsule 2   ibandronate (BONIVA) 150 MG tablet TAKE 1 TABLET BY MOUTH EVERY MONTH     lisinopril-hydrochlorothiazide (ZESTORETIC) 10-12.5 MG tablet Take 1 tablet by mouth daily. 30 tablet 0    Melatonin 5 MG CAPS Take 10 mg by mouth as needed.     meloxicam (MOBIC) 15 MG tablet Take 1 tablet (15 mg total) by mouth daily. 30 tablet 0   traZODone (DESYREL) 100 MG tablet TAKE 1 TABLET BY MOUTH AT BEDTIME AS NEEDED FOR SLEEP 90 tablet 1   Vitamin D, Ergocalciferol, (DRISDOL) 1.25 MG (50000 UNIT) CAPS capsule Take 1 capsule by mouth weekly 15 capsule 3   montelukast (SINGULAIR) 10 MG tablet Take 1 tablet (10 mg total) by mouth at bedtime. 30 tablet 0   potassium chloride (KLOR-CON) 10 MEQ tablet TAKE 2 TABLETS BY MOUTH EVERY DAY 180 tablet 0   alendronate (FOSAMAX) 70 MG tablet Take 1 tablet (70 mg total) by mouth every 7 (seven) days. Take with a full glass of water on an empty stomach. (Patient not taking:  Reported on 08/05/2022) 4 tablet 11   No facility-administered medications prior to visit.    No Known Allergies  Review of Systems  Constitutional:  Negative for fever and malaise/fatigue.  HENT:  Negative for congestion.   Eyes:  Negative for blurred vision.  Respiratory:  Negative for shortness of breath.   Cardiovascular:  Negative for chest pain, palpitations and leg swelling.  Gastrointestinal:  Negative for abdominal pain, blood in stool and nausea.  Genitourinary:  Negative for dysuria and frequency.  Musculoskeletal:  Negative for falls.  Skin:  Negative for rash.  Neurological:  Negative for dizziness, loss of consciousness and headaches.  Endo/Heme/Allergies:  Negative for environmental allergies.  Psychiatric/Behavioral:  Negative for depression. The patient is not nervous/anxious.        Objective:    Physical Exam Vitals and nursing note reviewed.  Constitutional:      Appearance: She is well-developed.  HENT:     Head: Normocephalic and atraumatic.  Eyes:     Conjunctiva/sclera: Conjunctivae normal.  Neck:     Thyroid: No thyromegaly.     Vascular: No carotid bruit or JVD.  Cardiovascular:     Rate and Rhythm: Normal rate and regular rhythm.      Heart sounds: Normal heart sounds. No murmur heard. Pulmonary:     Effort: Pulmonary effort is normal. No respiratory distress.     Breath sounds: Normal breath sounds. No wheezing or rales.  Chest:     Chest wall: No tenderness.  Musculoskeletal:     Cervical back: Normal range of motion and neck supple.  Neurological:     Mental Status: She is alert and oriented to person, place, and time.    BP 112/80 (BP Location: Left Arm, Patient Position: Sitting, Cuff Size: Normal)   Pulse 64   Temp 98.5 F (36.9 C) (Oral)   Resp 18   Ht 5\' 2"  (1.575 m)   Wt 150 lb 12.8 oz (68.4 kg)   LMP 03/30/2016   SpO2 97%   BMI 27.58 kg/m  Wt Readings from Last 3 Encounters:  08/05/22 150 lb 12.8 oz (68.4 kg)  07/24/22 150 lb (68 kg)  06/06/22 153 lb (69.4 kg)       Assessment & Plan:  Hyperlipidemia, unspecified hyperlipidemia type Assessment & Plan: Ca score 0 Lab Results  Component Value Date   CHOL 202 (H) 05/28/2022   HDL 72.30 05/28/2022   LDLCALC 113 (H) 05/28/2022   TRIG 82.0 05/28/2022   CHOLHDL 3 05/28/2022     Orders: -     Comprehensive metabolic panel -     Lipid panel  Hypothyroidism, unspecified type Assessment & Plan: Lab Results  Component Value Date   TSH 0.53 11/09/2021   Check tsh Con't synthroid  Orders: -     Potassium Chloride ER; Take 2 tablets (20 mEq total) by mouth daily.  Dispense: 180 tablet; Refill: 1 -     TSH  Primary hypertension Assessment & Plan: Well controlled, no changes to meds. Encouraged heart healthy diet such as the DASH diet and exercise as tolerated.    Orders: -     CBC with Differential/Platelet -     Comprehensive metabolic panel  Seasonal allergies Assessment & Plan: Stable with singulair  Orders: -     Montelukast Sodium; Take 1 tablet (10 mg total) by mouth at bedtime.  Dispense: 90 tablet; Refill: 3  Hyperparathyroidism, primary [facility]) Assessment & Plan: Per endo   Preventative health care Assessment &  Plan: Ghm utd Check labs  Health Maintenance  Topic Date Due   PAP SMEAR-Modifier  01/04/2017   COVID-19 Vaccine (4 - 2023-24 season) 12/07/2021   Zoster Vaccines- Shingrix (1 of 2) 11/04/2022 (Originally 01/17/2013)   HIV Screening  08/05/2023 (Originally 01/17/1978)   INFLUENZA VACCINE  11/07/2022   COLONOSCOPY (Pts 45-83yrs Insurance coverage will need to be confirmed)  04/28/2024   MAMMOGRAM  07/22/2024   DTaP/Tdap/Td (2 - Td or Tdap) 08/25/2031   Hepatitis C Screening  Completed   HPV VACCINES  Aged Out         I,Rachel Rivera,acting as a Neurosurgeon for [employer], DO.,have documented all relevant documentation on the behalf of Donato Schultz, DO,as directed by  Donato Schultz, DO while in the presence of Donato Schultz, DO.   I, Donato Schultz, DO, personally preformed the services described in this documentation.  All medical record entries made by the scribe were at my direction and in my presence.  I have reviewed the chart and discharge instructions (if applicable) and agree that the record reflects my personal performance and is accurate and complete. 08/05/22   Donato Schultz, DO

## 2022-08-05 NOTE — Assessment & Plan Note (Signed)
Ca score 0 Lab Results  Component Value Date   CHOL 202 (H) 05/28/2022   HDL 72.30 05/28/2022   LDLCALC 113 (H) 05/28/2022   TRIG 82.0 05/28/2022   CHOLHDL 3 05/28/2022

## 2022-08-05 NOTE — Assessment & Plan Note (Signed)
Well controlled, no changes to meds. Encouraged heart healthy diet such as the DASH diet and exercise as tolerated.  °

## 2022-08-06 ENCOUNTER — Encounter: Payer: Self-pay | Admitting: Family Medicine

## 2022-08-06 ENCOUNTER — Ambulatory Visit (INDEPENDENT_AMBULATORY_CARE_PROVIDER_SITE_OTHER): Payer: 59 | Admitting: Family Medicine

## 2022-08-06 VITALS — BP 110/82 | HR 71 | Ht 62.0 in | Wt 149.0 lb

## 2022-08-06 DIAGNOSIS — M1712 Unilateral primary osteoarthritis, left knee: Secondary | ICD-10-CM | POA: Diagnosis not present

## 2022-08-06 DIAGNOSIS — M25562 Pain in left knee: Secondary | ICD-10-CM

## 2022-08-06 DIAGNOSIS — G8929 Other chronic pain: Secondary | ICD-10-CM

## 2022-08-06 DIAGNOSIS — M170 Bilateral primary osteoarthritis of knee: Secondary | ICD-10-CM

## 2022-08-06 LAB — CBC WITH DIFFERENTIAL/PLATELET
Basophils Absolute: 0.1 10*3/uL (ref 0.0–0.1)
Basophils Relative: 1.2 % (ref 0.0–3.0)
Eosinophils Absolute: 0.1 10*3/uL (ref 0.0–0.7)
Eosinophils Relative: 1.6 % (ref 0.0–5.0)
HCT: 34 % — ABNORMAL LOW (ref 36.0–46.0)
Hemoglobin: 11.8 g/dL — ABNORMAL LOW (ref 12.0–15.0)
Lymphocytes Relative: 29.4 % (ref 12.0–46.0)
Lymphs Abs: 2 10*3/uL (ref 0.7–4.0)
MCHC: 34.6 g/dL (ref 30.0–36.0)
MCV: 90.4 fl (ref 78.0–100.0)
Monocytes Absolute: 0.6 10*3/uL (ref 0.1–1.0)
Monocytes Relative: 9.3 % (ref 3.0–12.0)
Neutro Abs: 3.9 10*3/uL (ref 1.4–7.7)
Neutrophils Relative %: 58.5 % (ref 43.0–77.0)
Platelets: 298 10*3/uL (ref 150.0–400.0)
RBC: 3.77 Mil/uL — ABNORMAL LOW (ref 3.87–5.11)
RDW: 12.8 % (ref 11.5–15.5)
WBC: 6.6 10*3/uL (ref 4.0–10.5)

## 2022-08-06 LAB — COMPREHENSIVE METABOLIC PANEL
ALT: 12 U/L (ref 0–35)
AST: 17 U/L (ref 0–37)
Albumin: 4.1 g/dL (ref 3.5–5.2)
Alkaline Phosphatase: 66 U/L (ref 39–117)
BUN: 17 mg/dL (ref 6–23)
CO2: 29 mEq/L (ref 19–32)
Calcium: 9.2 mg/dL (ref 8.4–10.5)
Chloride: 103 mEq/L (ref 96–112)
Creatinine, Ser: 0.89 mg/dL (ref 0.40–1.20)
GFR: 70.9 mL/min (ref >60.00)
Glucose, Bld: 98 mg/dL (ref 70–99)
Potassium: 3.5 mEq/L (ref 3.5–5.1)
Sodium: 139 mEq/L (ref 135–145)
Total Bilirubin: 0.2 mg/dL (ref 0.2–1.2)
Total Protein: 6.6 g/dL (ref 6.0–8.3)

## 2022-08-06 LAB — LIPID PANEL
Cholesterol: 194 mg/dL (ref 0–200)
HDL: 69.1 mg/dL (ref >39.00)
LDL Cholesterol: 100 mg/dL — ABNORMAL HIGH (ref 0–99)
NonHDL: 124.82
Total CHOL/HDL Ratio: 3
Triglycerides: 122 mg/dL (ref 0.0–149.0)
VLDL: 24.4 mg/dL (ref 0.0–40.0)

## 2022-08-06 LAB — TSH: TSH: 0.44 u[IU]/mL (ref 0.35–5.50)

## 2022-08-06 NOTE — Assessment & Plan Note (Signed)
Chronic instability  Hopeful that patient does make improvement with the viscosupplementation.  Patient did bring a sample of the viscosupplementation today.  Doing the injection for her.  Increase activity slowly.

## 2022-08-09 ENCOUNTER — Other Ambulatory Visit: Payer: Self-pay | Admitting: Family Medicine

## 2022-08-09 ENCOUNTER — Encounter: Payer: Self-pay | Admitting: Family Medicine

## 2022-08-09 DIAGNOSIS — D649 Anemia, unspecified: Secondary | ICD-10-CM

## 2022-08-09 MED ORDER — SODIUM HYALURONATE 60 MG/3ML IX PRSY
60.0000 mg | PREFILLED_SYRINGE | Freq: Once | INTRA_ARTICULAR | Status: AC
Start: 2022-08-09 — End: 2022-08-09
  Administered 2022-08-09: 60 mg via INTRA_ARTICULAR

## 2022-08-09 NOTE — Addendum Note (Signed)
Addended by: Ethlyn Daniels on: 08/09/2022 12:53 PM   Modules accepted: Orders

## 2022-08-09 NOTE — Telephone Encounter (Signed)
4 weeks? Please advise

## 2022-08-23 ENCOUNTER — Other Ambulatory Visit: Payer: Self-pay | Admitting: Family Medicine

## 2022-08-26 ENCOUNTER — Other Ambulatory Visit: Payer: Self-pay | Admitting: Family Medicine

## 2022-08-26 DIAGNOSIS — I10 Essential (primary) hypertension: Secondary | ICD-10-CM

## 2022-09-17 ENCOUNTER — Other Ambulatory Visit (INDEPENDENT_AMBULATORY_CARE_PROVIDER_SITE_OTHER): Payer: 59

## 2022-09-17 DIAGNOSIS — D649 Anemia, unspecified: Secondary | ICD-10-CM

## 2022-09-17 LAB — CBC WITH DIFFERENTIAL/PLATELET
Basophils Absolute: 0 10*3/uL (ref 0.0–0.1)
Basophils Relative: 0.7 % (ref 0.0–3.0)
Eosinophils Absolute: 0.2 10*3/uL (ref 0.0–0.7)
Eosinophils Relative: 3.1 % (ref 0.0–5.0)
HCT: 38.9 % (ref 36.0–46.0)
Hemoglobin: 12.9 g/dL (ref 12.0–15.0)
Lymphocytes Relative: 26.8 % (ref 12.0–46.0)
Lymphs Abs: 1.6 10*3/uL (ref 0.7–4.0)
MCHC: 33.1 g/dL (ref 30.0–36.0)
MCV: 90.2 fl (ref 78.0–100.0)
Monocytes Absolute: 0.6 10*3/uL (ref 0.1–1.0)
Monocytes Relative: 9.5 % (ref 3.0–12.0)
Neutro Abs: 3.5 10*3/uL (ref 1.4–7.7)
Neutrophils Relative %: 59.9 % (ref 43.0–77.0)
Platelets: 319 10*3/uL (ref 150.0–400.0)
RBC: 4.31 Mil/uL (ref 3.87–5.11)
RDW: 13.7 % (ref 11.5–15.5)
WBC: 5.9 10*3/uL (ref 4.0–10.5)

## 2022-09-18 LAB — IRON,TIBC AND FERRITIN PANEL
%SAT: 21 % (calc) (ref 16–45)
Ferritin: 7 ng/mL — ABNORMAL LOW (ref 16–232)
Iron: 78 ug/dL (ref 45–160)
TIBC: 366 mcg/dL (calc) (ref 250–450)

## 2022-09-25 ENCOUNTER — Encounter: Payer: Self-pay | Admitting: Family Medicine

## 2022-12-02 ENCOUNTER — Encounter: Payer: Self-pay | Admitting: Family Medicine

## 2022-12-02 ENCOUNTER — Ambulatory Visit: Payer: 59 | Admitting: Family Medicine

## 2022-12-02 VITALS — BP 98/80 | HR 70 | Temp 98.6°F | Resp 18 | Ht 62.0 in | Wt 149.2 lb

## 2022-12-02 DIAGNOSIS — D649 Anemia, unspecified: Secondary | ICD-10-CM

## 2022-12-02 DIAGNOSIS — B351 Tinea unguium: Secondary | ICD-10-CM

## 2022-12-02 MED ORDER — TERBINAFINE HCL 250 MG PO TABS
250.0000 mg | ORAL_TABLET | Freq: Every day | ORAL | 0 refills | Status: DC
Start: 2022-12-02 — End: 2023-07-10

## 2022-12-02 NOTE — Patient Instructions (Signed)
Cholesterol Content in Foods Cholesterol is a waxy, fat-like substance that helps to carry fat in the blood. The body needs cholesterol in small amounts, but too much cholesterol can cause damage to the arteries and heart. What foods have cholesterol?  Cholesterol is found in animal-based foods, such as meat, seafood, and dairy. Generally, low-fat dairy and lean meats have less cholesterol than full-fat dairy and fatty meats. The milligrams of cholesterol per serving (mg per serving) of common cholesterol-containing foods are listed below. Meats and other proteins Egg -- one large whole egg has 186 mg. Veal shank -- 4 oz (113 g) has 141 mg. Lean ground turkey (93% lean) -- 4 oz (113 g) has 118 mg. Fat-trimmed lamb loin -- 4 oz (113 g) has 106 mg. Lean ground beef (90% lean) -- 4 oz (113 g) has 100 mg. Lobster -- 3.5 oz (99 g) has 90 mg. Pork loin chops -- 4 oz (113 g) has 86 mg. Canned salmon -- 3.5 oz (99 g) has 83 mg. Fat-trimmed beef top loin -- 4 oz (113 g) has 78 mg. Frankfurter -- 1 frank (3.5 oz or 99 g) has 77 mg. Crab -- 3.5 oz (99 g) has 71 mg. Roasted chicken without skin, white meat -- 4 oz (113 g) has 66 mg. Light bologna -- 2 oz (57 g) has 45 mg. Deli-cut turkey -- 2 oz (57 g) has 31 mg. Canned tuna -- 3.5 oz (99 g) has 31 mg. Bacon -- 1 oz (28 g) has 29 mg. Oysters and mussels (raw) -- 3.5 oz (99 g) has 25 mg. Mackerel -- 1 oz (28 g) has 22 mg. Trout -- 1 oz (28 g) has 20 mg. Pork sausage -- 1 link (1 oz or 28 g) has 17 mg. Salmon -- 1 oz (28 g) has 16 mg. Tilapia -- 1 oz (28 g) has 14 mg. Dairy Soft-serve ice cream --  cup (4 oz or 86 g) has 103 mg. Whole-milk yogurt -- 1 cup (8 oz or 245 g) has 29 mg. Cheddar cheese -- 1 oz (28 g) has 28 mg. American cheese -- 1 oz (28 g) has 28 mg. Whole milk -- 1 cup (8 oz or 250 mL) has 23 mg. 2% milk -- 1 cup (8 oz or 250 mL) has 18 mg. Cream cheese -- 1 tablespoon (Tbsp) (14.5 g) has 15 mg. Cottage cheese --  cup (4 oz or  113 g) has 14 mg. Low-fat (1%) milk -- 1 cup (8 oz or 250 mL) has 10 mg. Sour cream -- 1 Tbsp (12 g) has 8.5 mg. Low-fat yogurt -- 1 cup (8 oz or 245 g) has 8 mg. Nonfat Greek yogurt -- 1 cup (8 oz or 228 g) has 7 mg. Half-and-half cream -- 1 Tbsp (15 mL) has 5 mg. Fats and oils Cod liver oil -- 1 tablespoon (Tbsp) (13.6 g) has 82 mg. Butter -- 1 Tbsp (14 g) has 15 mg. Lard -- 1 Tbsp (12.8 g) has 14 mg. Bacon grease -- 1 Tbsp (12.9 g) has 14 mg. Mayonnaise -- 1 Tbsp (13.8 g) has 5-10 mg. Margarine -- 1 Tbsp (14 g) has 3-10 mg. The items listed above may not be a complete list of foods with cholesterol. Exact amounts of cholesterol in these foods may vary depending on specific ingredients and brands. Contact a dietitian for more information. What foods do not have cholesterol? Most plant-based foods do not have cholesterol unless you combine them with a food that has   cholesterol. Foods without cholesterol include: Grains and cereals. Vegetables. Fruits. Vegetable oils, such as olive, canola, and sunflower oil. Legumes, such as peas, beans, and lentils. Nuts and seeds. Egg whites. The items listed above may not be a complete list of foods that do not have cholesterol. Contact a dietitian for more information. Summary The body needs cholesterol in small amounts, but too much cholesterol can cause damage to the arteries and heart. Cholesterol is found in animal-based foods, such as meat, seafood, and dairy. Generally, low-fat dairy and lean meats have less cholesterol than full-fat dairy and fatty meats. This information is not intended to replace advice given to you by your health care provider. Make sure you discuss any questions you have with your health care provider. Document Revised: 08/04/2020 Document Reviewed: 08/04/2020 Elsevier Patient Education  2024 [employer].  

## 2022-12-02 NOTE — Addendum Note (Signed)
Addended by: Thelma Barge D on: 12/02/2022 04:17 PM   Modules accepted: Orders

## 2022-12-02 NOTE — Progress Notes (Signed)
.                 7                                   0.   Established Patient Office Visit  Subjective   Patient ID: Elizabeth Meyer, female    DOB: Jun 20, 1960  Age: 62 y.o. MRN: [id number]  Chief Complaint  Patient presents with  . Nail Problem    Left foot, big toe    HPI Discussed the use of AI scribe software for clinical note transcription with the patient, who gave verbal consent to proceed.  History of Present Illness   The patient presents with a long-standing history of athlete's foot that has been intermittently treated with various creams without sustained success. Recently, she has noticed worsening symptoms, particularly in the big toe, which has become dry, peeling, and cracked. She is concerned that the fungal infection has now spread to the toenails, with visible changes in the big toenail and a possible spot on the right toenail.  In 2022, she was prescribed an oral antifungal medication, which she believes was successful in clearing the infection for several months. However, she has been unable to identify the specific medication or the prescribing physician. She has also used a topical antifungal cream, prescribed by the current physician, without significant improvement.  The patient also has a history of anemia, with slightly low iron levels noted on previous lab work. She is interested in rechecking these levels during the current visit.      Patient Active Problem List   Diagnosis Date Noted  . Preventative health care 11/18/2021  . Chronic rhinitis 02/11/2021  . Right foot pain 10/30/2020  . Dysphagia 06/19/2020  . Hyperlipidemia 06/19/2020  . Iron deficiency anemia 06/19/2020  . Daytime somnolence 06/19/2020  . OSA (obstructive sleep apnea) 06/19/2020  . Palpitations 04/17/2020  . Incomplete right bundle branch block 04/17/2020  . Nasal congestion 04/17/2020  . Seasonal allergies   . History of gastrointestinal ulcer    . Heart murmur   . Complex cyst of left ovary   . Degenerative arthritis of knee, bilateral 05/06/2019  . Lower extremity edema 02/11/2019  . Joint swelling 02/11/2019  . Primary hypertension 01/29/2019  . Nonallopathic lesion of sacral region 06/10/2018  . Nonallopathic lesion of lumbosacral region 06/10/2018  . Nonallopathic lesion of thoracic region 06/10/2018  . Nonallopathic lesion of cervical region 06/10/2018  . Nonallopathic lesion of rib cage 06/10/2018  . Hyperparathyroidism, primary (HCC) 01/11/2018  . Low back pain 09/09/2017  . Elevated LDL cholesterol level 06/17/2017  . Hypothyroidism 06/17/2017  . Post-menopausal 06/17/2017  . Cough 05/30/2017  . Suprapubic pain 05/30/2017  . Patellofemoral syndrome of both knees 12/19/2016  . Allergic dermatitis 09/16/2015  . Plantar fasciitis, right 07/27/2014  . Loss of transverse plantar arch 07/27/2014  . Pharyngitis 06/21/2014  . Endometrial polyp 06/06/2014    Class: Present on Admission  . 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 Sinusitis, acute frontal 06/03/2014  . IBS (irritable bowel syndrome) 03/08/2014   Past Medical History:  Diagnosis Date  . Acquired hypothyroidism 06/17/2017  . Allergic dermatitis 09/16/2015  . Complex cyst of left ovary   . Cough 05/30/2017  . 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 Degenerative arthritis of knee, bilateral 05/06/2019  . Elevated LDL cholesterol level 06/17/2017  . Endometrial polyp   .  Essential hypertension 01/29/2019  . Heart murmur Dr Eden Emms   dx 2012--  Benign--  per echo abnormal mitral inflow  . History of gastrointestinal ulcer   . Hyperparathyroidism, primary (HCC) 01/11/2018  . IBS (irritable bowel syndrome)   . Incomplete right bundle branch block 04/17/2020  . Joint swelling 02/11/2019  . Loss of transverse plantar arch 07/27/2014  . Low back pain 09/09/2017  . Lower extremity edema 02/11/2019  . Nasal congestion 04/17/2020  . Nonallopathic lesion of  cervical region 06/10/2018  . Nonallopathic lesion of lumbosacral region 06/10/2018  . Nonallopathic lesion of rib cage 06/10/2018  . Nonallopathic lesion of sacral region 06/10/2018  . Nonallopathic lesion of thoracic region 06/10/2018  . Palpitations 04/17/2020  . Patellofemoral syndrome of both knees 12/19/2016   Inj 10/30 bilateral Bilateral injection September 09, 2017 repeat injections in March 17, 2018 August 06, 2018 Durolane approved 01/15/2019.  Injected January 28, 2019  . Pharyngitis 06/21/2014  . Plantar fasciitis, right 07/27/2014  . Post-menopausal 06/17/2017  . Seasonal allergies   . Sinusitis, acute frontal 06/03/2014  . Suprapubic pain 05/30/2017  . Wears contact lenses    Past Surgical History:  Procedure Laterality Date  . CARPAL TUNNEL RELEASE Left 05/15/2017   Procedure: LEFT CARPAL TUNNEL RELEASE;  Surgeon: Cindee Salt, MD;  Location: [facility];  Service: Orthopedics;  Laterality: Left;  . COLONOSCOPY    . COLONOSCOPY WITH PROPOFOL  last one 04-28-2014  . CYSTOSCOPY N/A 06/06/2014   Procedure: CYSTOSCOPY;  Surgeon: Juluis Mire, MD;  Location: [facility];  Service: Gynecology;  Laterality: N/A;  . DILATATION & CURRETTAGE/HYSTEROSCOPY WITH RESECTOCOPE N/A 06/06/2014   Procedure: DILATATION & CURETTAGE/HYSTEROSCOPY WITH RESECTOCOPE;  Surgeon: Juluis Mire, MD;  Location: [facility] [place];  Service: Gynecology;  Laterality: N/A;  . LAPAROSCOPIC SALPINGO OOPHERECTOMY Left 06/06/2014   Procedure: LAPAROSCOPIC LEFT SALPINGO OOPHORECTOMY;  Surgeon: Juluis Mire, MD;  Location: [facility];  Service: Gynecology;  Laterality: Left;  . PARATHYROIDECTOMY Right 01/13/2018   Procedure: RIGHT SUPERIOR PARATHYROIDECTOMY;  Surgeon: Darnell Level, MD;  Location: [facility];  Service: General;  Laterality: Right;  . PLANTAR FASCIA SURGERY Left 2009   topaz procedure  . TRANSTHORACIC ECHOCARDIOGRAM  08-12-2007   normal LV,  ef 65-70%,  abnormal mitral  inflow with trivial MR,  trivial PR and TR   Social History   Tobacco Use  . Smoking status: Never  . Smokeless tobacco: Never  Vaping Use  . Vaping status: Never Used  Substance Use Topics  . Alcohol use: Yes    Comment: occasional  . Drug use: Never   Social History   Socioeconomic History  . Marital status: Married    Spouse name: Not on file  . Number of children: 2  . Years of education: Not on file  . Highest education level: Not on file  Occupational History  . Occupation: Chief Strategy Officer: [employer]  Tobacco Use  . Smoking status: Never  . Smokeless tobacco: Never  Vaping Use  . Vaping status: Never Used  Substance and Sexual Activity  . Alcohol use: Yes    Comment: occasional  . Drug use: Never  . Sexual activity: Yes    Birth control/protection: Post-menopausal  Other Topics Concern  . Not on file  Social History Narrative   Married, Administrator 2 daughters   1-2 caffeinated beverages/day   Social Determinants of Health   Financial Resource Strain: Not on file  Food Insecurity: Not on file  Transportation Needs: Not on file  Physical Activity: Not on file  Stress: Not on file  Social Connections: Not on file  Intimate Partner Violence: Not on file   Family Status  Relation Name Status  . Mother  Deceased at age 24  . Father  Alive  . MGF  (Not Specified)  . PGM  (Not Specified)  . PGF  (Not Specified)  . Other  (Not Specified)  . Neg Hx  (Not Specified)  No partnership data on file   Family History  Problem Relation Age of Onset  . Allergic rhinitis Mother   . Glaucoma Mother   . Hypertension Mother   . Hyperlipidemia Mother   . Heart disease Mother   . Arthritis-Osteo Father   . Tuberculosis Maternal Grandfather   . Diabetes Maternal Grandfather   . Asthma Paternal Grandmother   . Dementia Paternal Grandmother   . Heart disease Paternal Grandfather   . Tuberculosis Other   . Colon cancer Neg Hx   . Stomach  cancer Neg Hx   . Esophageal cancer Neg Hx   . Pancreatic cancer Neg Hx    No Known Allergies    Review of Systems  Constitutional:  Negative for fever and malaise/fatigue.  HENT:  Negative for congestion.   Eyes:  Negative for blurred vision.  Respiratory:  Negative for cough and shortness of breath.   Cardiovascular:  Negative for chest pain, palpitations and leg swelling.  Gastrointestinal:  Negative for vomiting.  Musculoskeletal:  Negative for back pain.  Skin:  Negative for rash.  Neurological:  Negative for loss of consciousness and headaches.      Objective:     BP 98/80 (BP Location: Left Arm, Patient Position: Sitting, Cuff Size: Normal)   Pulse 70   Temp 98.6 F (37 C) (Oral)   Resp 18   Ht 5\' 2"  (1.575 m)   Wt 149 lb 3.2 oz (67.7 kg)   LMP 03/30/2016   SpO2 98%   BMI 27.29 kg/m  BP Readings from Last 3 Encounters:  12/02/22 98/80  08/06/22 110/82  08/05/22 112/80   Wt Readings from Last 3 Encounters:  12/02/22 149 lb 3.2 oz (67.7 kg)  08/06/22 149 lb (67.6 kg)  08/05/22 150 lb 12.8 oz (68.4 kg)   SpO2 Readings from Last 3 Encounters:  12/02/22 98%  08/06/22 96%  08/05/22 97%      Physical Exam Vitals and nursing note reviewed.  Constitutional:      General: She is not in acute distress.    Appearance: Normal appearance. She is well-developed.  HENT:     Head: Normocephalic and atraumatic.  Eyes:     General: No scleral icterus.       Right eye: No discharge.        Left eye: No discharge.  Cardiovascular:     Rate and Rhythm: Normal rate and regular rhythm.     Heart sounds: No murmur heard. Pulmonary:     Effort: Pulmonary effort is normal. No respiratory distress.     Breath sounds: Normal breath sounds.  Abdominal:     Palpations: Abdomen is soft.  Musculoskeletal:        General: Normal range of motion.     Cervical back: Normal range of motion and neck supple.     Right lower leg: No edema.     Left lower leg: No edema.   Skin:    General: Skin is warm  and dry.     Comments: TOENAILS---  BOTH BIG TOENAILS THICK AND YELLOW L>R   Neurological:     General: No focal deficit present.     Mental Status: She is alert and oriented to person, place, and time.  Psychiatric:        Mood and Affect: Mood normal.        Behavior: Behavior normal.        Thought Content: Thought content normal.        Judgment: Judgment normal.     No results found for any visits on 12/02/22.  Last CBC Lab Results  Component Value Date   WBC 5.9 09/17/2022   HGB 12.9 09/17/2022   HCT 38.9 09/17/2022   MCV 90.2 09/17/2022   MCH 29.9 06/02/2020   RDW 13.7 09/17/2022   PLT 319.0 09/17/2022   Last metabolic panel Lab Results  Component Value Date   GLUCOSE 98 08/05/2022   NA 139 08/05/2022   K 3.5 08/05/2022   CL 103 08/05/2022   CO2 29 08/05/2022   BUN 17 08/05/2022   CREATININE 0.89 08/05/2022   GFR 70.90 08/05/2022   CALCIUM 9.2 08/05/2022   PHOS 2.9 10/07/2017   PROT 6.6 08/05/2022   ALBUMIN 4.1 08/05/2022   BILITOT 0.2 08/05/2022   ALKPHOS 66 08/05/2022   AST 17 08/05/2022   ALT 12 08/05/2022   ANIONGAP 6 01/09/2018   Last lipids Lab Results  Component Value Date   CHOL 194 08/05/2022   HDL 69.10 08/05/2022   LDLCALC 100 (H) 08/05/2022   TRIG 122.0 08/05/2022   CHOLHDL 3 08/05/2022   Last hemoglobin A1c No results found for: "HGBA1C" Last thyroid functions Lab Results  Component Value Date   TSH 0.44 08/05/2022   T4TOTAL 7.6 01/29/2019   Last vitamin D Lab Results  Component Value Date   VD25OH 41.71 06/06/2022   Last vitamin B12 and Folate No results found for: "VITAMINB12", "FOLATE"    The 10-year ASCVD risk score (Arnett DK, et al., 2019) is: 1.9%    Assessment & Plan:   Problem List Items Addressed This Visit   None Visit Diagnoses     Onychomycosis    -  Primary   Relevant Medications   terbinafine (LAMISIL) 250 MG tablet   Other Relevant Orders   Comprehensive  metabolic panel   Comprehensive metabolic panel   Comprehensive metabolic panel   Anemia, unspecified type       Relevant Orders   CBC+Platelet+Hem Review   Iron, TIBC and Ferritin Panel     Assessment and Plan    Onychomycosis Recurrent fungal infection of the toenails. Previous successful treatment with oral antifungal medication. Currently, there is evidence of new fungal infection in the toenails. -Start Terbinafine oral antifungal therapy for 12 weeks. -Check liver function tests today, in 6 weeks, and at the end of treatment due to potential hepatotoxicity of Terbinafine. -Expect the infected portion of the nail to grow out and new healthy nail to grow in.  Iron Deficiency Mildly low ferritin noted on previous lab work. Hemoglobin was within normal limits. -Repeat ferritin level today to assess for iron deficiency.        Return if symptoms worsen or fail to improve.    Donato Schultz, DO

## 2022-12-03 LAB — IRON,TIBC AND FERRITIN PANEL
%SAT: 31 % (ref 16–45)
Ferritin: 11 ng/mL — ABNORMAL LOW (ref 16–232)
Iron: 113 ug/dL (ref 45–160)
TIBC: 361 ug/dL (ref 250–450)

## 2022-12-03 LAB — CBC WITH DIFFERENTIAL/PLATELET
Absolute Monocytes: 462 {cells}/uL (ref 200–950)
Basophils Absolute: 42 {cells}/uL (ref 0–200)
Basophils Relative: 0.7 %
Eosinophils Absolute: 90 cells/uL (ref 15–500)
Eosinophils Relative: 1.5 %
HCT: 37.8 % (ref 35.0–45.0)
Hemoglobin: 12.5 g/dL (ref 11.7–15.5)
Lymphs Abs: 1662 {cells}/uL (ref 850–3900)
MCH: 30.4 pg (ref 27.0–33.0)
MCHC: 33.1 g/dL (ref 32.0–36.0)
MCV: 92 fL (ref 80.0–100.0)
MPV: 9.6 fL (ref 7.5–12.5)
Monocytes Relative: 7.7 %
Neutro Abs: 3744 {cells}/uL (ref 1500–7800)
Neutrophils Relative %: 62.4 %
Platelets: 292 10*3/uL (ref 140–400)
RBC: 4.11 10*6/uL (ref 3.80–5.10)
RDW: 11.9 % (ref 11.0–15.0)
Total Lymphocyte: 27.7 %
WBC: 6 10*3/uL (ref 3.8–10.8)

## 2022-12-03 LAB — COMPREHENSIVE METABOLIC PANEL
ALT: 13 U/L (ref 0–35)
AST: 17 U/L (ref 0–37)
Albumin: 4.1 g/dL (ref 3.5–5.2)
Alkaline Phosphatase: 71 U/L (ref 39–117)
BUN: 13 mg/dL (ref 6–23)
CO2: 31 meq/L (ref 19–32)
Calcium: 9.3 mg/dL (ref 8.4–10.5)
Chloride: 101 mEq/L (ref 96–112)
Creatinine, Ser: 0.81 mg/dL (ref 0.40–1.20)
GFR: 79.2 mL/min (ref >60.00)
Glucose, Bld: 141 mg/dL — ABNORMAL HIGH (ref 70–99)
Potassium: 3.3 meq/L — ABNORMAL LOW (ref 3.5–5.1)
Sodium: 140 meq/L (ref 135–145)
Total Bilirubin: 0.3 mg/dL (ref 0.2–1.2)
Total Protein: 6.7 g/dL (ref 6.0–8.3)

## 2022-12-03 LAB — PATHOLOGIST SMEAR REVIEW

## 2022-12-17 ENCOUNTER — Other Ambulatory Visit: Payer: Self-pay | Admitting: Physician Assistant

## 2022-12-23 ENCOUNTER — Other Ambulatory Visit: Payer: Self-pay | Admitting: Family Medicine

## 2022-12-25 LAB — HM PAP SMEAR: HM Pap smear: NORMAL

## 2023-01-23 ENCOUNTER — Other Ambulatory Visit (INDEPENDENT_AMBULATORY_CARE_PROVIDER_SITE_OTHER): Payer: 59

## 2023-01-23 ENCOUNTER — Encounter: Payer: Self-pay | Admitting: Family Medicine

## 2023-01-23 DIAGNOSIS — B351 Tinea unguium: Secondary | ICD-10-CM | POA: Diagnosis not present

## 2023-01-23 LAB — COMPREHENSIVE METABOLIC PANEL
ALT: 12 U/L (ref 0–35)
AST: 13 U/L (ref 0–37)
Albumin: 4.1 g/dL (ref 3.5–5.2)
Alkaline Phosphatase: 67 U/L (ref 39–117)
BUN: 16 mg/dL (ref 6–23)
CO2: 30 meq/L (ref 19–32)
Calcium: 9.3 mg/dL (ref 8.4–10.5)
Chloride: 103 meq/L (ref 96–112)
Creatinine, Ser: 0.77 mg/dL (ref 0.40–1.20)
GFR: 84.08 mL/min (ref >60.00)
Glucose, Bld: 89 mg/dL (ref 70–99)
Potassium: 3.8 meq/L (ref 3.5–5.1)
Sodium: 142 meq/L (ref 135–145)
Total Bilirubin: 0.4 mg/dL (ref 0.2–1.2)
Total Protein: 6.3 g/dL (ref 6.0–8.3)

## 2023-02-19 ENCOUNTER — Other Ambulatory Visit: Payer: Self-pay | Admitting: Family Medicine

## 2023-02-19 DIAGNOSIS — I10 Essential (primary) hypertension: Secondary | ICD-10-CM

## 2023-02-19 DIAGNOSIS — E039 Hypothyroidism, unspecified: Secondary | ICD-10-CM

## 2023-03-25 ENCOUNTER — Emergency Department (HOSPITAL_BASED_OUTPATIENT_CLINIC_OR_DEPARTMENT_OTHER)
Admission: EM | Admit: 2023-03-25 | Discharge: 2023-03-26 | Disposition: A | Discharge status: Home / Self Care | Payer: 59 | Attending: Emergency Medicine | Admitting: Emergency Medicine

## 2023-03-25 ENCOUNTER — Other Ambulatory Visit: Payer: Self-pay

## 2023-03-25 ENCOUNTER — Telehealth: Payer: Self-pay | Admitting: Family Medicine

## 2023-03-25 ENCOUNTER — Other Ambulatory Visit: Payer: Self-pay | Admitting: Family Medicine

## 2023-03-25 ENCOUNTER — Encounter (HOSPITAL_BASED_OUTPATIENT_CLINIC_OR_DEPARTMENT_OTHER): Payer: Self-pay | Admitting: Emergency Medicine

## 2023-03-25 DIAGNOSIS — R519 Headache, unspecified: Secondary | ICD-10-CM | POA: Diagnosis not present

## 2023-03-25 DIAGNOSIS — R197 Diarrhea, unspecified: Secondary | ICD-10-CM | POA: Diagnosis present

## 2023-03-25 DIAGNOSIS — Z20822 Contact with and (suspected) exposure to covid-19: Secondary | ICD-10-CM | POA: Diagnosis not present

## 2023-03-25 DIAGNOSIS — R112 Nausea with vomiting, unspecified: Secondary | ICD-10-CM

## 2023-03-25 DIAGNOSIS — R1110 Vomiting, unspecified: Secondary | ICD-10-CM | POA: Insufficient documentation

## 2023-03-25 LAB — CBC
HCT: 38.6 % (ref 36.0–46.0)
Hemoglobin: 13.4 g/dL (ref 12.0–15.0)
MCH: 31.6 pg (ref 26.0–34.0)
MCHC: 34.7 g/dL (ref 30.0–36.0)
MCV: 91 fL (ref 80.0–100.0)
Platelets: 287 10*3/uL (ref 150–400)
RBC: 4.24 MIL/uL (ref 3.87–5.11)
RDW: 12.2 % (ref 11.5–15.5)
WBC: 8.9 10*3/uL (ref 4.0–10.5)
nRBC: 0 % (ref 0.0–0.2)

## 2023-03-25 LAB — URINALYSIS, ROUTINE W REFLEX MICROSCOPIC
Bilirubin Urine: NEGATIVE
Glucose, UA: NEGATIVE mg/dL
Ketones, ur: 15 mg/dL — AB
Nitrite: NEGATIVE
Protein, ur: 30 mg/dL — AB
Specific Gravity, Urine: 1.02 (ref 1.005–1.030)
pH: 7 (ref 5.0–8.0)

## 2023-03-25 LAB — COMPREHENSIVE METABOLIC PANEL
ALT: 25 U/L (ref 0–44)
AST: 27 U/L (ref 15–41)
Albumin: 4.1 g/dL (ref 3.5–5.0)
Alkaline Phosphatase: 73 U/L (ref 38–126)
Anion gap: 8 (ref 5–15)
BUN: 18 mg/dL (ref 6–20)
CO2: 26 mmol/L (ref 22–32)
Calcium: 8.3 mg/dL — ABNORMAL LOW (ref 8.9–10.3)
Chloride: 103 mmol/L (ref 98–111)
Creatinine, Ser: 0.69 mg/dL (ref 0.44–1.00)
GFR, Estimated: >60 mL/min (ref >60)
Glucose, Bld: 118 mg/dL — ABNORMAL HIGH (ref 70–99)
Potassium: 3 mmol/L — ABNORMAL LOW (ref 3.5–5.1)
Sodium: 137 mmol/L (ref 135–145)
Total Bilirubin: 0.7 mg/dL (ref <1.2)
Total Protein: 7.1 g/dL (ref 6.5–8.1)

## 2023-03-25 LAB — RESP PANEL BY RT-PCR (RSV, FLU A&B, COVID)  RVPGX2
Influenza A by PCR: NEGATIVE
Influenza B by PCR: NEGATIVE
Resp Syncytial Virus by PCR: NEGATIVE
SARS Coronavirus 2 by RT PCR: NEGATIVE

## 2023-03-25 LAB — URINALYSIS, MICROSCOPIC (REFLEX)

## 2023-03-25 LAB — LIPASE, BLOOD: Lipase: 32 U/L (ref 11–51)

## 2023-03-25 MED ORDER — ONDANSETRON HCL 4 MG PO TABS: 4.0000 mg | ORAL_TABLET | Freq: Three times a day (TID) | ORAL | 0 refills | Status: AC | PRN

## 2023-03-25 NOTE — Telephone Encounter (Signed)
Triage called back and let me know that she will be advising the pt to go to [facility] since we will be closing in 20 mins.  Pt still wanted me to ask to see if you would send in antinausea med to control the n/v?

## 2023-03-25 NOTE — ED Triage Notes (Addendum)
Her symptoms started last night with diarrhea then emesis this morning . Denies fever  no abd pain .  Also co headache

## 2023-03-25 NOTE — Telephone Encounter (Signed)
Pt called stating that, since about midnight last night (12.16.24), she has been vomiting and having diarrhea. Pt stated that she has not eaten anything but she was able to keep some liquid down. Pt was transferred to triage nurse for further eval.

## 2023-03-25 NOTE — Telephone Encounter (Signed)
Initial Comment Caller states she experienced diarrhea and vomiting at the same time early this morning, and this afternoon, no blood in vomit, has urinated in past eight hours, hasn't eaten much all day Translation No Nurse Assessment Nurse: Naval architect, RN, Kristacious Date/Time (Eastern Time): 03/25/2023 4:32:19 PM Confirm and document reason for call. If symptomatic, describe symptoms. ---Caller states she has been having diarrhea and has been vomiting today. States it started overnight. Does the patient have any new or worsening symptoms? ---Yes Will a triage be completed? ---Yes Related visit to physician within the last 2 weeks? ---No Does the PT have any chronic conditions? (i.e. diabetes, asthma, this includes High risk factors for pregnancy, etc.) ---No Is this a behavioral health or substance abuse call? ---No Guidelines Guideline Title Affirmed Question Affirmed Notes Nurse Date/Time (Eastern Time) Vomiting [1] SEVERE vomiting (e.g., 6 or more times/day) AND [2] present > 8 hours (Exception: Patient sounds well, is drinking liquids, does not sound dehydrated, and vomiting has lasted less than 24 hours.) Slatcher, RN, Kristacious 03/25/2023 4:34:35 PM PLEASE NOTE: All timestamps contained within this report are represented as [place] Standard Time. CONFIDENTIALTY NOTICE: This fax transmission is intended only for the addressee. It contains information that is legally privileged, confidential or otherwise protected from use or disclosure. If you are not the intended recipient, you are strictly prohibited from reviewing, disclosing, copying using or disseminating any of this information or taking any action in reliance on or regarding this information. If you have received this fax in error, please notify us immediately by telephone so that we can arrange for its return to [place]. Phone: [phone], Toll-Free: [phone], Fax: [phone] Page: 2 of 2 Call Id:  [id number] Disp. Time Lamount Cohen Time) Disposition Final User 03/25/2023 4:41:51 PM Go to ED Now (or PCP triage) Yes Slatcher, RN, Kristacious Final Disposition 03/25/2023 4:41:51 PM Go to ED Now (or PCP triage) Yes Slatcher, RN, Kristacious Caller Disagree/Comply Comply Caller Understands Yes PreDisposition Call Doctor Care Advice Given Per Guideline GO TO ED/UCC NOW (OR PCP TRIAGE): * IF NO PCP (PRIMARY CARE PROVIDER) SECOND-LEVEL TRIAGE: You need to be seen within the next hour. Go to the ED/UCC at _____________ Hospital. Leave as soon as you can. CARE ADVICE per Vomiting (Adult) guideline. Referrals GO TO FACILITY UNDECIDED

## 2023-03-25 NOTE — Telephone Encounter (Signed)
Mychart message sent.

## 2023-03-26 MED ORDER — DICYCLOMINE HCL 20 MG PO TABS: 20.0000 mg | ORAL_TABLET | Freq: Two times a day (BID) | ORAL | 0 refills | Status: DC

## 2023-03-26 MED ORDER — DICYCLOMINE HCL 10 MG PO CAPS
10.0000 mg | ORAL_CAPSULE | Freq: Once | ORAL | Status: AC
  Administered 2023-03-26: 10 mg via ORAL
  Filled 2023-03-26: qty 1

## 2023-03-26 MED ORDER — ONDANSETRON 4 MG PO TBDP
8.0000 mg | ORAL_TABLET | Freq: Once | ORAL | Status: AC
  Administered 2023-03-26: 8 mg via ORAL
  Filled 2023-03-26: qty 2

## 2023-03-26 NOTE — ED Provider Notes (Signed)
Patient is screaming at me and crying that no one has done anything.  EDP apologized for the patient's wait and frustration.  Then patient is yelling that her husband hasn't been brought back.  Patient believes herself to be dehydrated and there is no plan per her.  She states she was told to come in by PMD.  Based on notes in EPIC this is untrue.  Patient is not dehydrated.  Will start oral rehydration protocol.  Patient is clearly having a panic attack    Elizabeth Pullen, MD 03/26/23 0202

## 2023-03-26 NOTE — ED Provider Notes (Signed)
[place] [facility] AT [facility] Provider Note   CSN: [id number] Arrival date & time: 03/25/23  1733     History  Chief Complaint  Patient presents with   Diarrhea    Elizabeth Meyer is a 62 y.o. female.  The history is provided by the patient.  Diarrhea Quality:  Watery Severity:  Moderate Onset quality:  Gradual Duration:  1 day Timing:  Intermittent Progression:  Unchanged Relieved by:  Nothing Worsened by:  Nothing Ineffective treatments:  None tried Associated symptoms: vomiting   Associated symptoms: no abdominal pain and no fever   Associated symptoms comment:  And headache       Home Medications Prior to Admission medications   Medication Sig Start Date End Date Taking? Authorizing Provider  cetirizine (ZYRTEC) 10 MG tablet Take 1 tablet (10 mg total) by mouth 2 (two) times daily as needed for allergies (Can use an extra dose during flare ups). 05/29/21   Kozlow, Alvira Philips, MD  clotrimazole-betamethasone (LOTRISONE) cream Apply 1 application topically 2 (two) times daily. For 2 weeks 11/22/20   Zola Button, Myrene Buddy R, DO  famotidine (PEPCID) 40 MG tablet TAKE 1 TABLET BY MOUTH EVERY DAY 12/24/21   Unk Lightning, PA  gabapentin (NEURONTIN) 100 MG capsule TAKE 2 CAPSULES BY MOUTH AT BEDTIME. 01/04/22   Judi Saa, DO  ibandronate (BONIVA) 150 MG tablet TAKE 1 TABLET BY MOUTH EVERY MONTH 07/03/22   [provider]  lisinopril-hydrochlorothiazide (ZESTORETIC) 10-12.5 MG tablet TAKE 1 TABLET BY MOUTH EVERY DAY 02/19/23   Donato Schultz, DO  Melatonin 5 MG CAPS Take 10 mg by mouth as needed.    [provider]  meloxicam (MOBIC) 15 MG tablet TAKE 1 TABLET (15 MG TOTAL) BY MOUTH DAILY. 08/23/22   Judi Saa, DO  montelukast (SINGULAIR) 10 MG tablet Take 1 tablet (10 mg total) by mouth at bedtime. 08/05/22   Donato Schultz, DO  ondansetron (ZOFRAN) 4 MG tablet Take 1 tablet (4 mg total) by mouth every  8 (eight) hours as needed for nausea or vomiting. 03/25/23   Zola Button, Grayling Congress, DO  potassium chloride (KLOR-CON) 10 MEQ tablet TAKE 2 TABLETS BY MOUTH DAILY 02/19/23   Zola Button, Grayling Congress, DO  terbinafine (LAMISIL) 250 MG tablet Take 1 tablet (250 mg total) by mouth daily. 12/02/22   Seabron Spates R, DO  traZODone (DESYREL) 100 MG tablet TAKE 1 TABLET BY MOUTH EVERY DAY AT BEDTIME AS NEEDED FOR SLEEP 12/23/22   Judi Saa, DO  Vitamin D, Ergocalciferol, (DRISDOL) 1.25 MG (50000 UNIT) CAPS capsule Take 1 capsule by mouth weekly 06/06/22   Carlus Pavlov, MD      Allergies    Patient has no known allergies.    Review of Systems   Review of Systems  Constitutional:  Negative for fever.  HENT:  Negative for facial swelling.   Respiratory:  Negative for wheezing and stridor.   Cardiovascular:  Negative for chest pain.  Gastrointestinal:  Positive for diarrhea and vomiting. Negative for abdominal pain.  All other systems reviewed and are negative.   Physical Exam Updated Vital Signs BP (!) 144/97 (BP Location: Right Arm)   Pulse 95   Temp 98.7 F (37.1 C) (Oral)   Resp 18   Wt 63.5 kg   LMP 03/30/2016   SpO2 99%   BMI 25.61 kg/m  Physical Exam Vitals and nursing note reviewed.  Constitutional:  General: She is not in acute distress.    Appearance: Normal appearance. She is well-developed.  HENT:     Head: Normocephalic and atraumatic.     Nose: Nose normal.  Eyes:     Pupils: Pupils are equal, round, and reactive to light.  Cardiovascular:     Rate and Rhythm: Normal rate and regular rhythm.     Pulses: Normal pulses.     Heart sounds: Normal heart sounds.  Pulmonary:     Effort: Pulmonary effort is normal. No respiratory distress.     Breath sounds: Normal breath sounds.  Abdominal:     General: Bowel sounds are normal. There is no distension.     Palpations: Abdomen is soft.     Tenderness: There is no abdominal tenderness. There is no guarding  or rebound.  Musculoskeletal:        General: Normal range of motion.     Cervical back: Normal range of motion and neck supple.  Skin:    General: Skin is warm and dry.     Capillary Refill: Capillary refill takes less than 2 seconds.     Findings: No erythema or rash.  Neurological:     General: No focal deficit present.     Mental Status: She is alert and oriented to person, place, and time.     Deep Tendon Reflexes: Reflexes normal.  Psychiatric:        Mood and Affect: Mood normal.     ED Results / Procedures / Treatments   Labs (all labs ordered are listed, but only abnormal results are displayed) Results for orders placed or performed during the hospital encounter of 03/25/23  Resp panel by RT-PCR (RSV, Flu A&B, Covid) Anterior Nasal Swab   Collection Time: 03/25/23  6:06 PM   Specimen: Anterior Nasal Swab  Result Value Ref Range   SARS Coronavirus 2 by RT PCR NEGATIVE NEGATIVE   Influenza A by PCR NEGATIVE NEGATIVE   Influenza B by PCR NEGATIVE NEGATIVE   Resp Syncytial Virus by PCR NEGATIVE NEGATIVE  Lipase, blood   Collection Time: 03/25/23  6:06 PM  Result Value Ref Range   Lipase 32 11 - 51 U/L  Comprehensive metabolic panel   Collection Time: 03/25/23  6:06 PM  Result Value Ref Range   Sodium 137 135 - 145 mmol/L   Potassium 3.0 (L) 3.5 - 5.1 mmol/L   Chloride 103 98 - 111 mmol/L   CO2 26 22 - 32 mmol/L   Glucose, Bld 118 (H) 70 - 99 mg/dL   BUN 18 6 - 20 mg/dL   Creatinine, Ser 6.21 0.44 - 1.00 mg/dL   Calcium 8.3 (L) 8.9 - 10.3 mg/dL   Total Protein 7.1 6.5 - 8.1 g/dL   Albumin 4.1 3.5 - 5.0 g/dL   AST 27 15 - 41 U/L   ALT 25 0 - 44 U/L   Alkaline Phosphatase 73 38 - 126 U/L   Total Bilirubin 0.7 <1.2 mg/dL   GFR, Estimated >30 >86 mL/min   Anion gap 8 5 - 15  CBC   Collection Time: 03/25/23  6:06 PM  Result Value Ref Range   WBC 8.9 4.0 - 10.5 K/uL   RBC 4.24 3.87 - 5.11 MIL/uL   Hemoglobin 13.4 12.0 - 15.0 g/dL   HCT 57.8 46.9 - 62.9 %   MCV  91.0 80.0 - 100.0 fL   MCH 31.6 26.0 - 34.0 pg   MCHC 34.7 30.0 - 36.0 g/dL  RDW 12.2 11.5 - 15.5 %   Platelets 287 150 - 400 K/uL   nRBC 0.0 0.0 - 0.2 %  Urinalysis, Routine w reflex microscopic -Urine, Clean Catch   Collection Time: 03/25/23  6:06 PM  Result Value Ref Range   Color, Urine YELLOW YELLOW   APPearance CLEAR CLEAR   Specific Gravity, Urine 1.020 1.005 - 1.030   pH 7.0 5.0 - 8.0   Glucose, UA NEGATIVE NEGATIVE mg/dL   Hgb urine dipstick TRACE (A) NEGATIVE   Bilirubin Urine NEGATIVE NEGATIVE   Ketones, ur 15 (A) NEGATIVE mg/dL   Protein, ur 30 (A) NEGATIVE mg/dL   Nitrite NEGATIVE NEGATIVE   Leukocytes,Ua TRACE (A) NEGATIVE  Urinalysis, Microscopic (reflex)   Collection Time: 03/25/23  6:06 PM  Result Value Ref Range   RBC / HPF 0-5 0 - 5 RBC/hpf   WBC, UA 11-20 0 - 5 WBC/hpf   Bacteria, UA RARE (A) NONE SEEN   Squamous Epithelial / HPF 0-5 0 - 5 /HPF   No results found.   Radiology No results found.  Procedures Procedures    Medications Ordered in ED Medications  ondansetron (ZOFRAN-ODT) disintegrating tablet 8 mg (8 mg Oral Given 03/26/23 0110)  dicyclomine (BENTYL) capsule 10 mg (10 mg Oral Given 03/26/23 0109)    ED Course/ Medical Decision Making/ A&P                                 Medical Decision Making Patient reports she called her family doctor regarding her symptoms and was concerned for dehydration   Amount and/or Complexity of Data Reviewed External Data Reviewed: notes.    Details: PMD notes reviewed  Labs: ordered.    Details: Negative covid and flu lipase is normal 32, normal white count 8.9, normal hemoglobin 13.4, normal platelets normal sodium 137, normal creatinine 0.69   Risk Prescription drug management. Risk Details: Well appearing.  No signs clinically or on labs of dehydration.  Given symptoms this is very likely the norovirus.  Zofran initiated along with oral rehydration protocol.  Patient is requesting discharge.   Patient is stable     Final Clinical Impression(s) / ED Diagnoses Final diagnoses:  None   Return for intractable cough, coughing up blood, fevers > 100.4 unrelieved by medication, shortness of breath, intractable vomiting, chest pain, shortness of breath, weakness, numbness, changes in speech, facial asymmetry, abdominal pain, passing out, Inability to tolerate liquids or food, cough, altered mental status or any concerns. No signs of systemic illness or infection. The patient is nontoxic-appearing on exam and vital signs are within normal limits.  I have reviewed the triage vital signs and the nursing notes. Pertinent labs & imaging results that were available during my care of the patient were reviewed by me and considered in my medical decision making (see chart for details). After history, exam, and medical workup I feel the patient has been appropriately medically screened and is safe for discharge home. Pertinent diagnoses were discussed with the patient. Patient was given return precautions.  Rx / DC Orders ED Discharge Orders     None         Cheresa Siers, MD 03/26/23 0200

## 2023-05-11 ENCOUNTER — Telehealth: Payer: 59 | Admitting: Nurse Practitioner

## 2023-05-11 ENCOUNTER — Other Ambulatory Visit: Payer: Self-pay | Admitting: Nurse Practitioner

## 2023-05-11 DIAGNOSIS — R6889 Other general symptoms and signs: Secondary | ICD-10-CM

## 2023-05-11 MED ORDER — OSELTAMIVIR PHOSPHATE 75 MG PO CAPS: 75.0000 mg | ORAL_CAPSULE | Freq: Two times a day (BID) | ORAL | 0 refills | Status: AC

## 2023-05-11 MED ORDER — PROMETHAZINE-DM 6.25-15 MG/5ML PO SYRP: 5.0000 mL | ORAL_SOLUTION | Freq: Four times a day (QID) | ORAL | 0 refills | Status: DC | PRN

## 2023-05-11 NOTE — Progress Notes (Signed)
 I have spent 5 minutes in review of e-visit questionnaire, review and updating patient chart, medical decision making and response to patient.   Claiborne Rigg, NP

## 2023-05-11 NOTE — Progress Notes (Signed)
E visit for Flu like symptoms   We are sorry that you are not feeling well.  Here is how we plan to help! Based on what you have shared with me it looks like you may have possible exposure to a virus that causes influenza.  Influenza or "the flu" is   an infection caused by a respiratory virus. The flu virus is highly contagious and persons who did not receive their yearly flu vaccination may "catch" the flu from close contact.  We have anti-viral medications to treat the viruses that cause this infection. They are not a "cure" and only shorten the course of the infection. These prescriptions are most effective when they are given within the first 2 days of "flu" symptoms. Antiviral medication are indicated if you have a high risk of complications from the flu. You should  also consider an antiviral medication if you are in close contact with someone who is at risk. These medications can help patients avoid complications from the flu  but have side effects that you should know. Possible side effects from Tamiflu or oseltamivir include nausea, vomiting, diarrhea, dizziness, headaches, eye redness, sleep problems or other respiratory symptoms. You should not take Tamiflu if you have an allergy to oseltamivir or any to the ingredients in Tamiflu.  Based upon your symptoms and potential risk factors I recommend that you follow the flu symptoms recommendation that I have listed below.   Warm salt water gargles, warm tea with honey for sore throat.  Motrin alternating with tylenol for pain I have sent a cough syrup   ANYONE WHO HAS FLU SYMPTOMS SHOULD: Stay home. The flu is highly contagious and going out or to work exposes others! Be sure to drink plenty of fluids. Water is fine as well as fruit juices, sodas and electrolyte beverages. You may want to stay away from caffeine or alcohol. If you are nauseated, try taking small sips of liquids. How do you know if you are getting enough fluid? Your urine  should be a pale yellow or almost colorless. Get rest. Taking a steamy shower or using a humidifier may help nasal congestion and ease sore throat pain. Using a saline nasal spray works much the same way. Cough drops, hard candies and sore throat lozenges may ease your cough. Line up a caregiver. Have someone check on you regularly.   GET HELP RIGHT AWAY IF: You cannot keep down liquids or your medications. You become short of breath Your fell like you are going to pass out or loose consciousness. Your symptoms persist after you have completed your treatment plan MAKE SURE YOU  Understand these instructions. Will watch your condition. Will get help right away if you are not doing well or get worse.  Your e-visit answers were reviewed by a board certified advanced clinical practitioner to complete your personal care plan.  Depending on the condition, your plan could have included both over the counter or prescription medications.  If there is a problem please reply  once you have received a response from your provider.  Your safety is important to [place].  If you have drug allergies check your prescription carefully.    You can use MyChart to ask questions about today's visit, request a non-urgent call back, or ask for a work or school excuse for 24 hours related to this e-Visit. If it has been greater than 24 hours you will need to follow up with your provider, or enter a new e-Visit to address  those concerns.  You will get an e-mail in the next two days asking about your experience.  I hope that your e-visit has been valuable and will speed your recovery. Thank you for using e-visits.

## 2023-05-18 ENCOUNTER — Other Ambulatory Visit: Payer: Self-pay | Admitting: Family Medicine

## 2023-05-24 ENCOUNTER — Other Ambulatory Visit: Payer: Self-pay | Admitting: Internal Medicine

## 2023-05-24 DIAGNOSIS — E559 Vitamin D deficiency, unspecified: Secondary | ICD-10-CM

## 2023-06-08 ENCOUNTER — Encounter: Payer: Self-pay | Admitting: Family Medicine

## 2023-06-09 ENCOUNTER — Ambulatory Visit: Payer: 59 | Admitting: Internal Medicine

## 2023-06-09 LAB — HM DEXA SCAN

## 2023-06-24 ENCOUNTER — Encounter: Payer: Self-pay | Admitting: Internal Medicine

## 2023-06-30 ENCOUNTER — Ambulatory Visit: Payer: 59 | Admitting: Internal Medicine

## 2023-07-10 ENCOUNTER — Ambulatory Visit: Admitting: Internal Medicine

## 2023-07-10 ENCOUNTER — Encounter: Payer: Self-pay | Admitting: Internal Medicine

## 2023-07-10 VITALS — BP 120/72 | HR 67 | Ht 62.0 in | Wt 146.6 lb

## 2023-07-10 DIAGNOSIS — E559 Vitamin D deficiency, unspecified: Secondary | ICD-10-CM | POA: Diagnosis not present

## 2023-07-10 DIAGNOSIS — E210 Primary hyperparathyroidism: Secondary | ICD-10-CM

## 2023-07-10 DIAGNOSIS — M8580 Other specified disorders of bone density and structure, unspecified site: Secondary | ICD-10-CM | POA: Diagnosis not present

## 2023-07-10 NOTE — Progress Notes (Signed)
 Patient ID: Elizabeth Meyer, female   DOB: 11-15-1960, 63 y.o.   MRN: [id number]   HPI  Elizabeth Meyer is a 63 y.o.-year-old female, initially referred by Dr Katrinka Blazing, returning for follow-up for history of primary hyperparathyroidism, vitamin D deficiency, and osteopenia.  Last visit 1 year and 2 months ago.  Interim history: No fractures since last visit. She slipped on black ice in 04/2023 but did not fracture despite breaking the fall with both forearms. No dizziness/vertigo/vision problems. She continues weightbearing exercises-initially with a Systems analyst, now guided boot camp classes - started in 12/2022.  She lost 7 pounds since last visit. She has less knee pain compared to last visit. She will have cataract sx. Tomorrow.  Reviewed history: Patient was found to have an elevated calcium during investigation by Dr. Katrinka Blazing, whom she was seeing for joint pain (hips, knees).  A PTH level was also found to be high.  She was referred to endocrinology for further investigation.  Reviewed pertinent labs: Lab Results  Component Value Date   PTH 35 06/25/2019   PTH 32 05/25/2018   PTH Comment 05/25/2018   PTH 98 (H) 10/07/2017   PTH 121 (H) 09/09/2017   CALCIUM 8.3 (L) 03/25/2023   CALCIUM 9.3 01/23/2023   CALCIUM 9.3 12/02/2022   CALCIUM 9.2 08/05/2022   CALCIUM 9.1 05/28/2022   CALCIUM 8.8 02/19/2022   CALCIUM 9.4 11/09/2021   CALCIUM 9.2 05/31/2021   CALCIUM 9.5 06/19/2020   CALCIUM 8.9 04/04/2020  Postop, calcium was normal, at 9.4, and PTH greatly decreased, at 30. Lab Results  Component Value Date   CAION 5.8 (H) 09/09/2017   She recently had osteoporosis on the latest bone density scan, previous osteopenia:  Lumbar spine L1-L4 (L2) Femoral neck (FN) 33% distal radius frax  06/09/2022 ([facility]) -2.1 RFN: -1.2 LFN: -0.8 -2.6 N/a  12/16/2021 ([facility]) -2.5 RFN: -1.5 LFN: -1.2 -2.6 N/a  09/02/2019 ([facility]) -2.4 (-0.1%) RFN: -1.0 LFN:  -0.9 -2.5 N/a  07/31/2017 ([facility]) -2.4 (-10.7%*) RFN: -1.3 LFN: -0.8 n/a 10-year fracture risk 5.9% 10-year hip fracture risk: 0.3%  02/24/2014 -1.5 N/a n/a N/a  *Statistically significant difference  No clear in comparison between the hip bone mineral densities between 2019 and 2015.  It looks that the bone density have decreased but I cannot tell whether it is the total hip BMD's that were compared.    Osteoporosis medicines: - Fosamax 01/2022 >> generalized muscle aches - Boniva monthly - started 03/2022 - no SEs  She had a high calcitriol level but normal magnesium and phosphorus: Component     Latest Ref Rng & Units 10/07/2017          Vitamin D 1, 25 (OH) Total     18 - 72 pg/mL 84 (H)  Vitamin D3 1, 25 (OH)     pg/mL 41  Vitamin D2 1, 25 (OH)     pg/mL 43  Magnesium     1.5 - 2.5 mg/dL 2.2  Phosphorus     2.3 - 4.6 mg/dL 2.9   40-[id number] urine calcium was normal but at the upper limit of normal: Component     Latest Ref Rng & Units 10/15/2017  Creatinine, 24H Ur     0.50 - 2.15 g/24 h 0.78  Calcium, 24H Urine     mg/24 h 198   No history of kidney stones.  Thyroid ultrasound (11/13/2017): 1 x 0.9 x 0.6 cm solid/almost completely solid, hypoechoic  nodule in the right mid gland, but no sign of parathyroid adenoma.  Indication was for repeat ultrasound in a year.  Technetium sestamibi parathyroid scan (11/13/2017): Was negative for parathyroid adenoma  4D CT of the neck (12/04/2017): No abnormal left-sided finding. On the right, there appears to be relative enlargement of the superior parathyroid gland (marked with double arrows), but this retains a somewhat flattened appearance. This could represent an early adenoma or hyperplasia. Below that on the right, there are 2 small nodular foci at the tracheoesophageal groove which do not show enhancement on arterial phase and therefore more consistent with small nonpathologic lymph nodes.  She saw Dr. Gerrit Friends and  had a right superior parathyroidectomy 01/13/2018.   Pathology showed an adenoma of 0.25 g (1.5 x 0.9 x 0.2 cm). Postoperatively, calcium was normal at 9.4 and PTH was greatly decreased, at 30.  No history of CKD. Last BUN/Cr: Lab Results  Component Value Date   BUN 18 03/25/2023   BUN 16 01/23/2023   CREATININE 0.69 03/25/2023   CREATININE 0.77 01/23/2023   She has a history of vitamin D deficiency.  Latest levels were normal: Lab Results  Component Value Date   VD25OH 41.71 06/06/2022   VD25OH 48.40 05/31/2021   VD25OH 41.85 04/13/2020   VD25OH 81 12/16/2019   VD25OH 50.94 06/25/2019   VD25OH 33.13 05/25/2018   VD25OH 34.95 09/09/2017  On ergocalciferol 50,000 units weekly.  Latest TSH was normal but she has a history of TSH levels in the low normal range: Lab Results  Component Value Date   TSH 0.44 08/05/2022   No family history of pituitary tumors, thyroid cancer, or osteoporosis.  Her daughter and her mother had kidney stones.   She has a history of carpal tunnel surgeries bilaterally. She has back pain with sciatica-herniated disc. She also has HTN. She has a history of iron deficiency anemia.  ROS: + see HPI  I reviewed pt's medications, allergies, PMH, social hx, family hx, and changes were documented in the history of present illness. Otherwise, unchanged from my initial visit note.  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   Degenerative arthritis of knee, bilateral 05/06/2019   Elevated LDL cholesterol level 06/17/2017   Endometrial polyp    Essential hypertension 01/29/2019   Heart murmur Dr Eden Emms   dx 2012--  Benign--  per echo abnormal mitral inflow   History of gastrointestinal ulcer    Hyperparathyroidism, primary (HCC) 01/11/2018   IBS (irritable bowel syndrome)    Incomplete right bundle  branch block 04/17/2020   Joint swelling 02/11/2019   Loss of transverse plantar arch 07/27/2014   Low back pain 09/09/2017   Lower extremity edema 02/11/2019   Nasal congestion 04/17/2020   Nonallopathic lesion of cervical region 06/10/2018   Nonallopathic lesion of lumbosacral region 06/10/2018   Nonallopathic lesion of rib cage 06/10/2018   Nonallopathic lesion of sacral region 06/10/2018   Nonallopathic lesion of thoracic region 06/10/2018   Palpitations 04/17/2020   Patellofemoral syndrome of both knees 12/19/2016   Inj 10/30 bilateral Bilateral injection September 09, 2017 repeat injections in March 17, 2018 August 06, 2018 Durolane approved 01/15/2019.  Injected January 28, 2019   Pharyngitis 06/21/2014   Plantar fasciitis, right 07/27/2014   Post-menopausal 06/17/2017   Seasonal allergies    Sinusitis, acute frontal 06/03/2014  Suprapubic pain 05/30/2017   Wears contact lenses    Past Surgical History:  Procedure Laterality Date   CARPAL TUNNEL RELEASE Left 05/15/2017   Procedure: LEFT CARPAL TUNNEL RELEASE;  Surgeon: Cindee Salt, MD;  Location: [facility];  Service: Orthopedics;  Laterality: Left;   COLONOSCOPY     COLONOSCOPY WITH PROPOFOL  last one 04-28-2014   CYSTOSCOPY N/A 06/06/2014   Procedure: CYSTOSCOPY;  Surgeon: Juluis Mire, MD;  Location: [facility];  Service: Gynecology;  Laterality: N/A;   DILATATION & CURRETTAGE/HYSTEROSCOPY WITH RESECTOCOPE N/A 06/06/2014   Procedure: DILATATION & CURETTAGE/HYSTEROSCOPY WITH RESECTOCOPE;  Surgeon: Juluis Mire, MD;  Location: [facility];  Service: Gynecology;  Laterality: N/A;   LAPAROSCOPIC SALPINGO OOPHERECTOMY Left 06/06/2014   Procedure: LAPAROSCOPIC LEFT SALPINGO OOPHORECTOMY;  Surgeon: Juluis Mire, MD;  Location: [facility];  Service: Gynecology;  Laterality: Left;   PARATHYROIDECTOMY Right 01/13/2018   Procedure: RIGHT SUPERIOR PARATHYROIDECTOMY;  Surgeon: Darnell Level, MD;   Location: [facility];  Service: General;  Laterality: Right;   PLANTAR FASCIA SURGERY Left 2009   topaz procedure   TRANSTHORACIC ECHOCARDIOGRAM  08-12-2007   normal LV,  ef 65-70%,  abnormal mitral inflow with trivial MR,  trivial PR and TR   Social History   Socioeconomic History   Marital status: Married    Spouse name: Not on file   Number of children: 2   Years of education: Not on file   Highest education level: Not on file  Occupational History   Occupation: preschool teacher    Employer: [employer]  Tobacco Use   Smoking status: Never   Smokeless tobacco: Never  Vaping Use   Vaping status: Never Used  Substance and Sexual Activity   Alcohol use: Yes    Comment: occasional   Drug use: Never   Sexual activity: Yes    Birth control/protection: Post-menopausal  Other Topics Concern   Not on file  Social History Narrative   Married, Administrator 2 daughters   1-2 caffeinated beverages/day   Social Drivers of Corporate investment banker Strain: Not on file  Food Insecurity: Not on file  Transportation Needs: Not on file  Physical Activity: Not on file  Stress: Not on file  Social Connections: Not on file  Intimate Partner Violence: Not on file   Current Outpatient Medications on File Prior to Visit  Medication Sig Dispense Refill   cetirizine (ZYRTEC) 10 MG tablet Take 1 tablet (10 mg total) by mouth 2 (two) times daily as needed for allergies (Can use an extra dose during flare ups). 180 tablet 1   clotrimazole-betamethasone (LOTRISONE) cream Apply 1 application topically 2 (two) times daily. For 2 weeks 60 g 0   dicyclomine (BENTYL) 20 MG tablet Take 1 tablet (20 mg total) by mouth 2 (two) times daily. 20 tablet 0   famotidine (PEPCID) 40 MG tablet TAKE 1 TABLET BY MOUTH EVERY DAY 90 tablet 3   gabapentin (NEURONTIN) 100 MG capsule TAKE 2 CAPSULES BY MOUTH AT BEDTIME. 180 capsule 2   ibandronate (BONIVA) 150 MG tablet TAKE 1 TABLET BY MOUTH EVERY MONTH      lisinopril-hydrochlorothiazide (ZESTORETIC) 10-12.5 MG tablet TAKE 1 TABLET BY MOUTH EVERY DAY 90 tablet 1   Melatonin 5 MG CAPS Take 10 mg by mouth as needed.     meloxicam (MOBIC) 15 MG tablet TAKE 1 TABLET (15 MG TOTAL) BY MOUTH DAILY. 30 tablet 0   montelukast (SINGULAIR)  10 MG tablet Take 1 tablet (10 mg total) by mouth at bedtime. 90 tablet 3   ondansetron (ZOFRAN) 4 MG tablet Take 1 tablet (4 mg total) by mouth every 8 (eight) hours as needed for nausea or vomiting. 20 tablet 0   potassium chloride (KLOR-CON) 10 MEQ tablet TAKE 2 TABLETS BY MOUTH DAILY 180 tablet 1   promethazine-dextromethorphan (PROMETHAZINE-DM) 6.25-15 MG/5ML syrup Take 5 mLs by mouth 4 (four) times daily as needed for cough. 240 mL 0   terbinafine (LAMISIL) 250 MG tablet Take 1 tablet (250 mg total) by mouth daily. 90 tablet 0   traZODone (DESYREL) 100 MG tablet TAKE 1 TABLET BY MOUTH AT BEDTIME AS NEEDED FOR SLEEP 90 tablet 1   Vitamin D, Ergocalciferol, (DRISDOL) 1.25 MG (50000 UNIT) CAPS capsule TAKE 1 CAPSULE BY MOUTH ONE TIME PER WEEK 15 capsule 3   No current facility-administered medications on file prior to visit.   No Known Allergies  Family History  Problem Relation Age of Onset   Allergic rhinitis Mother    Glaucoma Mother    Hypertension Mother    Hyperlipidemia Mother    Heart disease Mother    Arthritis-Osteo Father    Tuberculosis Maternal Grandfather    Diabetes Maternal Grandfather    Asthma Paternal Grandmother    Dementia Paternal Grandmother    Heart disease Paternal Grandfather    Tuberculosis Other    Colon cancer Neg Hx    Stomach cancer Neg Hx    Esophageal cancer Neg Hx    Pancreatic cancer Neg Hx     PE: BP 120/72   Pulse 67   Ht 5\' 2"  (1.575 m)   Wt 146 lb 9.6 oz (66.5 kg)   LMP 03/30/2016   SpO2 98%   BMI 26.81 kg/m  Wt Readings from Last 10 Encounters:  07/10/23 146 lb 9.6 oz (66.5 kg)  03/25/23 140 lb (63.5 kg)  12/02/22 149 lb 3.2 oz (67.7 kg)  08/06/22 149 lb  (67.6 kg)  08/05/22 150 lb 12.8 oz (68.4 kg)  07/24/22 150 lb (68 kg)  06/06/22 153 lb (69.4 kg)  03/12/22 151 lb (68.5 kg)  11/13/21 152 lb 9.6 oz (69.2 kg)  09/19/21 149 lb 9.6 oz (67.9 kg)   Constitutional: normal weight, in NAD Eyes: PERRLA, EOMI, no exophthalmos ENT: no thyromegaly, no cervical lymphadenopathy Cardiovascular: RRR, No MRG Respiratory: CTA B Musculoskeletal: no deformities Skin: no rashes Neurological: no tremor with outstretched hands  Assessment: 1. Hypercalcemia/hyperparathyroidism  2.  History of vitamin D deficiency  3.  Osteopenia  4.  Thyroid nodule  Plan: Patient with history of slightly elevated calcium and also elevated PTH level.  High ACTH was 121.  She also had a history of vitamin D deficiency and at last visit was on replacement with ergocalciferol weekly.  Latest level was reviewed and was normal. -She has no apparent complications from hypercalcemia: No history of nephrolithiasis, no osteoporosis (however, she does have osteopenia), no fractures.  She denied abdominal pain, depression, bone pain.  She did have joint pains for which she was seen Dr. Katrinka Blazing.  These improved. -She had extensive investigation with neck ultrasound, parathyroid scan and 4D CT in the last test finally revealed a parathyroid adenoma -She had parathyroidectomy on 01/13/2018 and this was successful in extracting the 0.25 g right superior adenomatous parathyroid -Calcium and PTH levels were normal after surgery, but she had a slightly low calcium level checked in 03/2023.  Of note, at that time, she  presented with nausea and vomiting and potassium was also low. -Will repeat her calcium today  2.  History of vitamin D deficiency -She continues on weekly ergocalciferol 50,000 units  -Vitamin D level was normal at last visit: Lab Results  Component Value Date   VD25OH 41.71 06/06/2022  -We will recheck the vitamin D level now  3.  Osteoporosis -Reviewed the bone density  scan from 12/2021: T-scores appear to be significantly lower at the femoral necks and slightly lower at the 33% distal radius, consistent with a hyperparathyroidism effect on cortical bone.  She had another bone density scan obtained since last visit (06/09/2022) which showed improved T-scores with the exception of the left forearm value, which remains stable.   -She continues with Boniva, which she tolerates well.  She tried Fosamax in the past but had generalized muscle aches -We again discussed about weightbearing exercises and at last visit, I gave her the [facility] recommended exercise regimen.  She previously had herniated disc and sciatica and she was not exercising but before last visit, she restarted exercising with a personal trainer.  At today's visit, she exercising in a [employer] in [place].  She lost 7 pounds since last visit. -Will continue to keep an eye on her bone density scans.  We can repeat a new scan in 2 years.  4.  Thyroid nodule -Neck compression symptoms -Latest TSH was normal last year: Lab Results  Component Value Date   TSH 0.44 08/05/2022  -I reviewed the thyroid ultrasound from 08/2018 which showed a small nodule, not worrisome -No imaging follow-up is needed unless she develops neck compression symptoms or palpable nodule  Orders Placed This Encounter  Procedures   VITAMIN D 25 Hydroxy (Vit-D Deficiency, Fractures)   Calcium   Carlus Pavlov, MD PhD [facility]

## 2023-07-10 NOTE — Patient Instructions (Signed)
 Please continue vitamin D 50,000 units daily  Please stop at the lab.  Please come back for a follow-up appointment in 1 year.

## 2023-07-11 ENCOUNTER — Other Ambulatory Visit: Payer: Self-pay | Admitting: Family Medicine

## 2023-07-11 ENCOUNTER — Encounter: Payer: Self-pay | Admitting: Internal Medicine

## 2023-07-11 DIAGNOSIS — J302 Other seasonal allergic rhinitis: Secondary | ICD-10-CM

## 2023-07-11 LAB — VITAMIN D 25 HYDROXY (VIT D DEFICIENCY, FRACTURES): Vit D, 25-Hydroxy: 100 ng/mL (ref 30–100)

## 2023-07-11 LAB — CALCIUM: Calcium: 9.2 mg/dL (ref 8.6–10.4)

## 2023-07-11 NOTE — Addendum Note (Signed)
 Addended by: Carlus Pavlov on: 07/11/2023 08:58 AM   Modules accepted: Orders

## 2023-08-03 ENCOUNTER — Other Ambulatory Visit: Payer: Self-pay | Admitting: Family Medicine

## 2023-08-03 DIAGNOSIS — J302 Other seasonal allergic rhinitis: Secondary | ICD-10-CM

## 2023-08-04 ENCOUNTER — Ambulatory Visit: Payer: 59 | Admitting: Internal Medicine

## 2023-08-08 ENCOUNTER — Other Ambulatory Visit: Payer: Self-pay | Admitting: Family Medicine

## 2023-08-08 DIAGNOSIS — E039 Hypothyroidism, unspecified: Secondary | ICD-10-CM

## 2023-08-08 DIAGNOSIS — I10 Essential (primary) hypertension: Secondary | ICD-10-CM

## 2023-08-08 DIAGNOSIS — B351 Tinea unguium: Secondary | ICD-10-CM

## 2023-09-01 ENCOUNTER — Other Ambulatory Visit: Payer: Self-pay | Admitting: Family Medicine

## 2023-09-01 DIAGNOSIS — I10 Essential (primary) hypertension: Secondary | ICD-10-CM

## 2023-09-05 ENCOUNTER — Other Ambulatory Visit: Payer: Self-pay | Admitting: Family Medicine

## 2023-09-05 DIAGNOSIS — I10 Essential (primary) hypertension: Secondary | ICD-10-CM

## 2023-09-05 DIAGNOSIS — J302 Other seasonal allergic rhinitis: Secondary | ICD-10-CM

## 2023-11-04 ENCOUNTER — Other Ambulatory Visit: Payer: Self-pay | Admitting: Family Medicine

## 2023-11-04 DIAGNOSIS — E039 Hypothyroidism, unspecified: Secondary | ICD-10-CM

## 2023-11-10 ENCOUNTER — Ambulatory Visit (INDEPENDENT_AMBULATORY_CARE_PROVIDER_SITE_OTHER): Admitting: Family Medicine

## 2023-11-10 ENCOUNTER — Encounter: Payer: Self-pay | Admitting: Family Medicine

## 2023-11-10 VITALS — BP 110/68 | HR 61 | Temp 98.3°F | Resp 16 | Ht 62.0 in | Wt 148.2 lb

## 2023-11-10 DIAGNOSIS — E039 Hypothyroidism, unspecified: Secondary | ICD-10-CM | POA: Diagnosis not present

## 2023-11-10 DIAGNOSIS — E785 Hyperlipidemia, unspecified: Secondary | ICD-10-CM | POA: Diagnosis not present

## 2023-11-10 DIAGNOSIS — J302 Other seasonal allergic rhinitis: Secondary | ICD-10-CM

## 2023-11-10 DIAGNOSIS — K219 Gastro-esophageal reflux disease without esophagitis: Secondary | ICD-10-CM

## 2023-11-10 DIAGNOSIS — Z87440 Personal history of urinary (tract) infections: Secondary | ICD-10-CM

## 2023-11-10 DIAGNOSIS — I10 Essential (primary) hypertension: Secondary | ICD-10-CM | POA: Diagnosis not present

## 2023-11-10 LAB — POC URINALSYSI DIPSTICK (AUTOMATED)
Bilirubin, UA: NEGATIVE
Blood, UA: NEGATIVE
Glucose, UA: NEGATIVE
Ketones, UA: NEGATIVE
Leukocytes, UA: NEGATIVE
Nitrite, UA: NEGATIVE
Protein, UA: NEGATIVE
Spec Grav, UA: <=1.005 — AB (ref 1.010–1.025)
Urobilinogen, UA: 0.2 U/dL
pH, UA: 5 (ref 5.0–8.0)

## 2023-11-10 MED ORDER — LISINOPRIL-HYDROCHLOROTHIAZIDE 10-12.5 MG PO TABS: 1.0000 | ORAL_TABLET | Freq: Every day | ORAL | 1 refills | Status: DC

## 2023-11-10 MED ORDER — MONTELUKAST SODIUM 10 MG PO TABS: 10.0000 mg | ORAL_TABLET | Freq: Every day | ORAL | 3 refills | Status: AC

## 2023-11-10 MED ORDER — POTASSIUM CHLORIDE ER 10 MEQ PO TBCR: 20.0000 meq | EXTENDED_RELEASE_TABLET | Freq: Every day | ORAL | 3 refills | Status: AC

## 2023-11-10 MED ORDER — FAMOTIDINE 40 MG PO TABS: 40.0000 mg | ORAL_TABLET | Freq: Every day | ORAL | 3 refills | Status: AC

## 2023-11-10 NOTE — Progress Notes (Signed)
 Subjective:    Patient ID: Elizabeth Meyer, female    DOB: 11-12-1960, 63 y.o.   MRN: [id number]  Chief Complaint  Patient presents with   Hypertension   Follow-up    HPI Patient is in today for f/u.  Discussed the use of AI scribe software for clinical note transcription with the patient, who gave verbal consent to proceed.  History of Present Illness Elizabeth Meyer is a 63 year old female who presents for medication refills and routine follow-up.  Her blood pressure has been well-controlled over the summer with lisinopril .  She has been off Singulair  since it was not refilled and has been taking Zyrtec  instead for her nasal issues. The allergy doctor originally prescribed Singulair , but she no longer sees him.  She switched to the 20 mg over-the-counter version of famotidine  after her prescription ran out. She finds that sometimes the 20 mg is insufficient depending on her diet and timing, and she inquires about taking two 20 mg tablets or getting a prescription.  She had a urinary tract infection detected during a Pap smear in September without any symptoms. She requests a urine test during this visit to check for any asymptomatic UTIs. No current symptoms of a UTI, such as pain or urgency.  She takes trazodone  and ondansetron  as needed, with ondansetron  used during a norovirus episode last winter. She also takes Boniva monthly for bone density, which is monitored by her endocrinologist.       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   Degenerative arthritis of knee, bilateral 05/06/2019   Elevated LDL cholesterol level 06/17/2017   Endometrial polyp    Essential hypertension 01/29/2019   Heart murmur Dr Delford   dx 2012--  Benign--  per echo abnormal mitral inflow   History of gastrointestinal ulcer     Hyperparathyroidism, primary (HCC) 01/11/2018   IBS (irritable bowel syndrome)    Incomplete right bundle branch block 04/17/2020   Joint swelling 02/11/2019   Loss of transverse plantar arch 07/27/2014   Low back pain 09/09/2017   Lower extremity edema 02/11/2019   Nasal congestion 04/17/2020   Nonallopathic lesion of cervical region 06/10/2018   Nonallopathic lesion of lumbosacral region 06/10/2018   Nonallopathic lesion of rib cage 06/10/2018   Nonallopathic lesion of sacral region 06/10/2018   Nonallopathic lesion of thoracic region 06/10/2018   Palpitations 04/17/2020   Patellofemoral syndrome of both knees 12/19/2016   Inj 10/30 bilateral Bilateral injection September 09, 2017 repeat injections in March 17, 2018 August 06, 2018 Durolane approved 01/15/2019.  Injected January 28, 2019   Pharyngitis 06/21/2014   Plantar fasciitis, right 07/27/2014   Post-menopausal 06/17/2017   Seasonal allergies    Sinusitis, acute frontal 06/03/2014   Suprapubic pain 05/30/2017   Wears contact lenses     Past Surgical History:  Procedure Laterality Date   CARPAL TUNNEL RELEASE Left 05/15/2017   Procedure: LEFT CARPAL TUNNEL RELEASE;  Surgeon: Murrell Kuba, MD;  Location: [facility];  Service: Orthopedics;  Laterality: Left;   COLONOSCOPY     COLONOSCOPY WITH PROPOFOL   last one 04-28-2014   CYSTOSCOPY N/A 06/06/2014   Procedure: CYSTOSCOPY;  Surgeon: Norleen GORMAN Skill, MD;  Location: [facility];  Service: Gynecology;  Laterality: N/A;   DILATATION & CURRETTAGE/HYSTEROSCOPY WITH RESECTOCOPE N/A 06/06/2014  Procedure: DILATATION & CURETTAGE/HYSTEROSCOPY WITH RESECTOCOPE;  Surgeon: Norleen GORMAN Skill, MD;  Location: [facility];  Service: Gynecology;  Laterality: N/A;   LAPAROSCOPIC SALPINGO OOPHERECTOMY Left 06/06/2014   Procedure: LAPAROSCOPIC LEFT SALPINGO OOPHORECTOMY;  Surgeon: Norleen GORMAN Skill, MD;  Location: [facility];  Service: Gynecology;  Laterality: Left;    PARATHYROIDECTOMY Right 01/13/2018   Procedure: RIGHT SUPERIOR PARATHYROIDECTOMY;  Surgeon: Eletha Boas, MD;  Location: [facility];  Service: General;  Laterality: Right;   PLANTAR FASCIA SURGERY Left 2009   topaz procedure   TRANSTHORACIC ECHOCARDIOGRAM  08-12-2007   normal LV,  ef 65-70%,  abnormal mitral inflow with trivial MR,  trivial PR and TR    Family History  Problem Relation Age of Onset   Allergic rhinitis Mother    Glaucoma Mother    Hypertension Mother    Hyperlipidemia Mother    Heart disease Mother    Arthritis-Osteo Father    Tuberculosis Maternal Grandfather    Diabetes Maternal Grandfather    Asthma Paternal Grandmother    Dementia Paternal Grandmother    Heart disease Paternal Grandfather    Tuberculosis Other    Colon cancer Neg Hx    Stomach cancer Neg Hx    Esophageal cancer Neg Hx    Pancreatic cancer Neg Hx     Social History   Socioeconomic History   Marital status: Married    Spouse name: Not on file   Number of children: 2   Years of education: Not on file   Highest education level: Not on file  Occupational History   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: [employer]  Tobacco Use   Smoking status: Never   Smokeless tobacco: Never  Vaping Use   Vaping status: Never Used  Substance and Sexual Activity   Alcohol use: Yes    Comment: occasional   Drug use: Never   Sexual activity: Yes    Birth control/protection: Post-menopausal  Other Topics Concern   Not on file  Social History Narrative   Married, Administrator 2 daughters   1-2 caffeinated beverages/day   Social Drivers of Corporate investment banker Strain: Not on file  Food Insecurity: Not on file  Transportation Needs: Not on file  Physical Activity: Not on file  Stress: Not on file  Social Connections: Not on file  Intimate Partner Violence: Not on file    Outpatient Medications Prior to Visit  Medication Sig Dispense Refill   cetirizine  (ZYRTEC ) 10 MG tablet Take  1 tablet (10 mg total) by mouth 2 (two) times daily as needed for allergies (Can use an extra dose during flare ups). 180 tablet 1   clotrimazole -betamethasone  (LOTRISONE ) cream Apply 1 application topically 2 (two) times daily. For 2 weeks 60 g 0   gabapentin  (NEURONTIN ) 100 MG capsule TAKE 2 CAPSULES BY MOUTH AT BEDTIME. 180 capsule 2   ibandronate (BONIVA) 150 MG tablet TAKE 1 TABLET BY MOUTH EVERY MONTH     Melatonin 5 MG CAPS Take 10 mg by mouth as needed.     ondansetron  (ZOFRAN ) 4 MG tablet Take 1 tablet (4 mg total) by mouth every 8 (eight) hours as needed for nausea or vomiting. 20 tablet 0   famotidine  (PEPCID ) 40 MG tablet TAKE 1 TABLET BY MOUTH EVERY DAY 90 tablet 3   lisinopril -hydrochlorothiazide  (ZESTORETIC ) 10-12.5 MG tablet Take 1 tablet by mouth daily. Pt needs office visit for further refills 30 tablet 0   montelukast  (SINGULAIR ) 10 MG  tablet Take 1 tablet (10 mg total) by mouth at bedtime. Pt needs office visit for further refills 30 tablet 0   potassium chloride  (KLOR-CON ) 10 MEQ tablet TAKE 2 TABLETS (20 MEQ TOTAL) BY MOUTH DAILY. PT NEEDS OFFICE VISIT FOR FURTHER REFILLS 180 tablet 0   traZODone  (DESYREL ) 100 MG tablet TAKE 1 TABLET BY MOUTH AT BEDTIME AS NEEDED FOR SLEEP 90 tablet 1   promethazine -dextromethorphan (PROMETHAZINE -DM) 6.25-15 MG/5ML syrup Take 5 mLs by mouth 4 (four) times daily as needed for cough. 240 mL 0   No facility-administered medications prior to visit.    No Known Allergies  ROS     Objective:    Physical Exam Vitals and nursing note reviewed.  Constitutional:      General: She is not in acute distress.    Appearance: Normal appearance. She is well-developed.  HENT:     Head: Normocephalic and atraumatic.  Eyes:     General: No scleral icterus.       Right eye: No discharge.        Left eye: No discharge.  Cardiovascular:     Rate and Rhythm: Normal rate and regular rhythm.     Heart sounds: No murmur heard. Pulmonary:     Effort:  Pulmonary effort is normal. No respiratory distress.     Breath sounds: Normal breath sounds.  Musculoskeletal:        General: Normal range of motion.     Cervical back: Normal range of motion and neck supple.     Right lower leg: No edema.     Left lower leg: No edema.  Skin:    General: Skin is warm and dry.  Neurological:     Mental Status: She is alert and oriented to person, place, and time.  Psychiatric:        Mood and Affect: Mood normal.        Behavior: Behavior normal.        Thought Content: Thought content normal.        Judgment: Judgment normal.     BP 110/68 (BP Location: Left Arm, Patient Position: Sitting, Cuff Size: Normal)   Pulse 61   Temp 98.3 F (36.8 C) (Oral)   Resp 16   Ht 5' 2 (1.575 m)   Wt 148 lb 3.2 oz (67.2 kg)   LMP 03/30/2016   SpO2 94%   BMI 27.11 kg/m  Wt Readings from Last 3 Encounters:  11/10/23 148 lb 3.2 oz (67.2 kg)  07/10/23 146 lb 9.6 oz (66.5 kg)  03/25/23 140 lb (63.5 kg)    Diabetic Foot Exam - Simple   No data filed    Lab Results  Component Value Date   WBC 4.4 11/11/2023   HGB 12.7 11/11/2023   HCT 37.2 11/11/2023   PLT 277.0 11/11/2023   GLUCOSE 90 11/11/2023   CHOL 203 (H) 11/11/2023   TRIG 97.0 11/11/2023   HDL 71.30 11/11/2023   LDLCALC 112 (H) 11/11/2023   ALT 15 11/11/2023   AST 17 11/11/2023   NA 141 11/11/2023   K 3.7 11/11/2023   CL 105 11/11/2023   CREATININE 0.84 11/11/2023   BUN 13 11/11/2023   CO2 31 11/11/2023   TSH 0.82 11/11/2023    Lab Results  Component Value Date   TSH 0.82 11/11/2023   Lab Results  Component Value Date   WBC 4.4 11/11/2023   HGB 12.7 11/11/2023   HCT 37.2 11/11/2023   MCV 92.2 11/11/2023  PLT 277.0 11/11/2023   Lab Results  Component Value Date   NA 141 11/11/2023   K 3.7 11/11/2023   CO2 31 11/11/2023   GLUCOSE 90 11/11/2023   BUN 13 11/11/2023   CREATININE 0.84 11/11/2023   BILITOT 0.4 11/11/2023   ALKPHOS 68 11/11/2023   AST 17 11/11/2023    ALT 15 11/11/2023   PROT 6.4 11/11/2023   ALBUMIN 4.2 11/11/2023   CALCIUM 8.9 11/11/2023   ANIONGAP 8 03/25/2023   GFR 75.32 11/11/2023   Lab Results  Component Value Date   CHOL 203 (H) 11/11/2023   Lab Results  Component Value Date   HDL 71.30 11/11/2023   Lab Results  Component Value Date   LDLCALC 112 (H) 11/11/2023   Lab Results  Component Value Date   TRIG 97.0 11/11/2023   Lab Results  Component Value Date   CHOLHDL 3 11/11/2023   No results found for: HGBA1C     Assessment & Plan:  Primary hypertension Assessment & Plan: Well controlled, no changes to meds. Encouraged heart healthy diet such as the DASH diet and exercise as tolerated.    Orders: -     Lisinopril -hydroCHLOROthiazide ; Take 1 tablet by mouth daily. Pt needs office visit for further refills  Dispense: 90 tablet; Refill: 1 -     CBC with Differential/Platelet; Future -     Comprehensive metabolic panel with GFR; Future  Hypothyroidism, unspecified type Assessment & Plan: Check labs   Orders: -     Potassium Chloride  ER; Take 2 tablets (20 mEq total) by mouth daily.  Dispense: 180 tablet; Refill: 3 -     TSH; Future  Hyperlipidemia, unspecified hyperlipidemia type Assessment & Plan: Encourage heart healthy diet such as MIND or DASH diet, increase exercise, avoid trans fats, simple carbohydrates and processed foods, consider a krill or fish or flaxseed oil cap daily.    Orders: -     Comprehensive metabolic panel with GFR; Future -     Lipid panel; Future  Seasonal allergies -     Montelukast  Sodium; Take 1 tablet (10 mg total) by mouth at bedtime.  Dispense: 90 tablet; Refill: 3  Gastroesophageal reflux disease, unspecified whether esophagitis present -     Famotidine ; Take 1 tablet (40 mg total) by mouth daily.  Dispense: 90 tablet; Refill: 3  History of UTI -     POCT Urinalysis Dipstick (Automated)  Assessment and Plan Assessment & Plan Hypertension   Hypertension is  well-controlled. Refill lisinopril  prescription.  Allergic rhinitis   She has been using Zyrtec  instead of Singulair , resulting in nasal symptoms.  Gastroesophageal reflux disease (GERD)   Over-the-counter famotidine  20 mg is sometimes insufficient for symptom control, affected by diet and timing. Send prescription for famotidine . Advise her to compare the cost of prescription versus over-the-counter famotidine .  History of urinary tract infection   She has a history of asymptomatic UTI detected during a Pap smear in September. No current symptoms, but she requests a urine check due to the previous asymptomatic UTI. Order urine test to check for UTI.     Maddi Collar R Lowne Chase, DO

## 2023-11-11 ENCOUNTER — Other Ambulatory Visit (INDEPENDENT_AMBULATORY_CARE_PROVIDER_SITE_OTHER)

## 2023-11-11 DIAGNOSIS — E785 Hyperlipidemia, unspecified: Secondary | ICD-10-CM

## 2023-11-11 DIAGNOSIS — I10 Essential (primary) hypertension: Secondary | ICD-10-CM | POA: Diagnosis not present

## 2023-11-11 DIAGNOSIS — E039 Hypothyroidism, unspecified: Secondary | ICD-10-CM | POA: Diagnosis not present

## 2023-11-11 LAB — COMPREHENSIVE METABOLIC PANEL WITH GFR
ALT: 15 U/L (ref 0–35)
AST: 17 U/L (ref 0–37)
Albumin: 4.2 g/dL (ref 3.5–5.2)
Alkaline Phosphatase: 68 U/L (ref 39–117)
BUN: 13 mg/dL (ref 6–23)
CO2: 31 meq/L (ref 19–32)
Calcium: 8.9 mg/dL (ref 8.4–10.5)
Chloride: 105 meq/L (ref 96–112)
Creatinine, Ser: 0.84 mg/dL (ref 0.40–1.20)
GFR: 75.32 mL/min (ref >60.00)
Glucose, Bld: 90 mg/dL (ref 70–99)
Potassium: 3.7 meq/L (ref 3.5–5.1)
Sodium: 141 meq/L (ref 135–145)
Total Bilirubin: 0.4 mg/dL (ref 0.2–1.2)
Total Protein: 6.4 g/dL (ref 6.0–8.3)

## 2023-11-11 LAB — CBC WITH DIFFERENTIAL/PLATELET
Basophils Absolute: 0 K/uL (ref 0.0–0.1)
Basophils Relative: 0.7 % (ref 0.0–3.0)
Eosinophils Absolute: 0.2 K/uL (ref 0.0–0.7)
Eosinophils Relative: 3.8 % (ref 0.0–5.0)
HCT: 37.2 % (ref 36.0–46.0)
Hemoglobin: 12.7 g/dL (ref 12.0–15.0)
Lymphocytes Relative: 31 % (ref 12.0–46.0)
Lymphs Abs: 1.4 K/uL (ref 0.7–4.0)
MCHC: 34.1 g/dL (ref 30.0–36.0)
MCV: 92.2 fl (ref 78.0–100.0)
Monocytes Absolute: 0.5 K/uL (ref 0.1–1.0)
Monocytes Relative: 10.4 % (ref 3.0–12.0)
Neutro Abs: 2.4 K/uL (ref 1.4–7.7)
Neutrophils Relative %: 54.1 % (ref 43.0–77.0)
Platelets: 277 K/uL (ref 150.0–400.0)
RBC: 4.04 Mil/uL (ref 3.87–5.11)
RDW: 12.6 % (ref 11.5–15.5)
WBC: 4.4 K/uL (ref 4.0–10.5)

## 2023-11-11 LAB — TSH: TSH: 0.82 u[IU]/mL (ref 0.35–5.50)

## 2023-11-11 LAB — LIPID PANEL
Cholesterol: 203 mg/dL — ABNORMAL HIGH (ref 0–200)
HDL: 71.3 mg/dL (ref >39.00)
LDL Cholesterol: 112 mg/dL — ABNORMAL HIGH (ref 0–99)
NonHDL: 131.65
Total CHOL/HDL Ratio: 3
Triglycerides: 97 mg/dL (ref 0.0–149.0)
VLDL: 19.4 mg/dL (ref 0.0–40.0)

## 2023-11-13 ENCOUNTER — Other Ambulatory Visit: Payer: Self-pay | Admitting: Family Medicine

## 2023-11-13 ENCOUNTER — Other Ambulatory Visit: Payer: Self-pay

## 2023-11-13 MED ORDER — TRAZODONE HCL 100 MG PO TABS: 100.0000 mg | ORAL_TABLET | Freq: Every evening | ORAL | 0 refills | Status: DC | PRN

## 2023-11-13 NOTE — Assessment & Plan Note (Signed)
 Encourage heart healthy diet such as MIND or DASH diet, increase exercise, avoid trans fats, simple carbohydrates and processed foods, consider a krill or fish or flaxseed oil cap daily.

## 2023-11-13 NOTE — Assessment & Plan Note (Signed)
 Well controlled, no changes to meds. Encouraged heart healthy diet such as the DASH diet and exercise as tolerated.

## 2023-11-13 NOTE — Assessment & Plan Note (Signed)
 Check labs

## 2023-11-16 ENCOUNTER — Ambulatory Visit: Payer: Self-pay | Admitting: Family Medicine

## 2024-02-10 NOTE — Progress Notes (Unsigned)
 [facility] [address] [place] [address] Phone: [phone] Subjective:   Elizabeth Meyer am a scribe for Dr. Claudene.   I'm seeing this patient by the request  of:  Antonio Cyndee Jamee JONELLE, DO  CC: Low back pain follow-up  YEP:Dlagzrupcz  Elizabeth Meyer is a 63 y.o. female coming in with complaint of lower back pain. Last seen in April 2024 for knee injections. Patient states that it has been a problem since September 7th due to a yard sale. Taking tylenol  extra strength or Aleve as needed. For the first month she was limited in movements and exercise. Now able to do more but the pain is still there.    Last x-rays of the lumbar spine were in 2019 that was fairly unremarkable.  These were independently visualized by me today.    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   Degenerative arthritis of knee, bilateral 05/06/2019   Elevated LDL cholesterol level 06/17/2017   Endometrial polyp    Essential hypertension 01/29/2019   Heart murmur Dr Delford   dx 2012--  Benign--  per echo abnormal mitral inflow   History of gastrointestinal ulcer    Hyperparathyroidism, primary 01/11/2018   IBS (irritable bowel syndrome)    Incomplete right bundle branch block 04/17/2020   Joint swelling 02/11/2019   Loss of transverse plantar arch 07/27/2014   Low back pain 09/09/2017   Lower extremity edema 02/11/2019   Nasal congestion 04/17/2020   Nonallopathic lesion of cervical region 06/10/2018   Nonallopathic lesion of lumbosacral region 06/10/2018   Nonallopathic lesion of rib cage 06/10/2018   Nonallopathic lesion of sacral region 06/10/2018   Nonallopathic lesion of thoracic region 06/10/2018   Palpitations 04/17/2020   Patellofemoral syndrome of both knees 12/19/2016   Inj 10/30 bilateral Bilateral injection September 09, 2017 repeat injections in March 17, 2018 August 06, 2018 Durolane approved 01/15/2019.  Injected January 28, 2019   Pharyngitis 06/21/2014   Plantar fasciitis, right 07/27/2014   Post-menopausal 06/17/2017   Seasonal allergies    Sinusitis, acute frontal 06/03/2014   Suprapubic pain 05/30/2017   Wears contact lenses    Past Surgical History:  Procedure Laterality Date   CARPAL TUNNEL RELEASE Left 05/15/2017   Procedure: LEFT CARPAL TUNNEL RELEASE;  Surgeon: Murrell Kuba, MD;  Location: [facility];  Service: Orthopedics;  Laterality: Left;   COLONOSCOPY     COLONOSCOPY WITH PROPOFOL   last one 04-28-2014   CYSTOSCOPY N/A 06/06/2014   Procedure: CYSTOSCOPY;  Surgeon: Norleen GORMAN Skill, MD;  Location: [facility];  Service: Gynecology;  Laterality: N/A;   DILATATION & CURRETTAGE/HYSTEROSCOPY WITH RESECTOCOPE N/A 06/06/2014   Procedure: DILATATION & CURETTAGE/HYSTEROSCOPY WITH RESECTOCOPE;  Surgeon: Norleen GORMAN Skill, MD;  Location: [facility];  Service: Gynecology;  Laterality: N/A;   LAPAROSCOPIC SALPINGO OOPHERECTOMY Left 06/06/2014   Procedure: LAPAROSCOPIC LEFT SALPINGO OOPHORECTOMY;  Surgeon: Norleen GORMAN Skill, MD;  Location: [facility];  Service: Gynecology;  Laterality: Left;   PARATHYROIDECTOMY Right 01/13/2018   Procedure: RIGHT SUPERIOR PARATHYROIDECTOMY;  Surgeon: Eletha Boas, MD;  Location: [facility];  Service: General;  Laterality: Right;   PLANTAR FASCIA SURGERY Left 2009   topaz procedure   TRANSTHORACIC ECHOCARDIOGRAM  08-12-2007   normal LV,  ef 65-70%,  abnormal mitral inflow with trivial MR,  trivial PR and TR   Social History   Socioeconomic History   Marital status: Married    Spouse name: Not on file   Number of children: 2   Years of education: Not on file   Highest education level: Not on file  Occupational History   Occupation: preschool teacher    Employer: [employer]  Tobacco Use   Smoking status: Never   Smokeless  tobacco: Never  Vaping Use   Vaping status: Never Used  Substance and Sexual Activity   Alcohol use: Yes    Comment: occasional   Drug use: Never   Sexual activity: Yes    Birth control/protection: Post-menopausal  Other Topics Concern   Not on file  Social History Narrative   Married, administrator 2 daughters   1-2 caffeinated beverages/day   Social Drivers of Corporate Investment Banker Strain: Not on file  Food Insecurity: Not on file  Transportation Needs: Not on file  Physical Activity: Not on file  Stress: Not on file  Social Connections: Not on file   No Known Allergies Family History  Problem Relation Age of Onset   Allergic rhinitis Mother    Glaucoma Mother    Hypertension Mother    Hyperlipidemia Mother    Heart disease Mother    Arthritis-Osteo Father    Tuberculosis Maternal Grandfather    Diabetes Maternal Grandfather    Asthma Paternal Grandmother    Dementia Paternal Grandmother    Heart disease Paternal Grandfather    Tuberculosis Other    Colon cancer Neg Hx    Stomach cancer Neg Hx    Esophageal cancer Neg Hx    Pancreatic cancer Neg Hx     Current Outpatient Medications (Endocrine & Metabolic):    ibandronate (BONIVA) 150 MG tablet, TAKE 1 TABLET BY MOUTH EVERY MONTH  Current Outpatient Medications (Cardiovascular):    lisinopril -hydrochlorothiazide  (ZESTORETIC ) 10-12.5 MG tablet, Take 1 tablet by mouth daily. Pt needs office visit for further refills  Current Outpatient Medications (Respiratory):    cetirizine  (ZYRTEC ) 10 MG tablet, Take 1 tablet (10 mg total) by mouth 2 (two) times daily as needed for allergies (Can use an extra dose during flare ups).   montelukast  (SINGULAIR ) 10 MG tablet, Take 1 tablet (10 mg total) by mouth at bedtime.  Current Outpatient Medications (Analgesics):    meloxicam  (MOBIC ) 15 MG tablet, Take 1 tablet (15 mg total) by mouth daily.   Current Outpatient Medications (Other):     clotrimazole -betamethasone  (LOTRISONE ) cream, Apply 1 application topically 2 (two) times daily. For 2 weeks   famotidine  (PEPCID ) 40 MG tablet, Take 1 tablet (40 mg total) by mouth daily.   gabapentin  (NEURONTIN ) 100 MG capsule, TAKE 2 CAPSULES BY MOUTH AT BEDTIME.   Melatonin 5 MG CAPS, Take 10 mg by mouth as needed.   ondansetron  (ZOFRAN ) 4 MG tablet, Take 1 tablet (4 mg total) by mouth every 8 (eight) hours as needed for nausea or vomiting.   potassium chloride  (KLOR-CON ) 10 MEQ tablet, Take 2 tablets (20 mEq total) by mouth daily.   traZODone  (DESYREL ) 100 MG tablet, Take 1 tablet (100 mg total) by mouth at bedtime as needed. for sleep   Reviewed prior external information including notes and imaging from  primary care provider As well as notes that were available from care everywhere and other healthcare systems.  Past medical history, social, surgical and family history all reviewed in electronic medical record.  No  pertanent information unless stated regarding to the chief complaint.   Review of Systems:  No headache, visual changes, nausea, vomiting, diarrhea, constipation, dizziness, abdominal pain, skin rash, fevers, chills, night sweats, weight loss, swollen lymph nodes, body aches, joint swelling, chest pain, shortness of breath, mood changes. POSITIVE muscle aches  Objective  Blood pressure 122/70, pulse (!) 58, height 5' 2 (1.575 m), weight 147 lb (66.7 kg), last menstrual period 03/30/2016, SpO2 97%.   General: No apparent distress alert and oriented x3 mood and affect normal, dressed appropriately.  HEENT: Pupils equal, extraocular movements intact  Respiratory: Patient's speak in full sentences and does not appear short of breath  Cardiovascular: No lower extremity edema, non tender, no erythema  Low back exam shows patient does have some loss lordosis.  Tightness noted over the sacroiliac joint on the left side.  Positive FABER test on the left side.  Negative straight  leg test.   Osteopathic findings   T9 extended rotated and side bent left L2 flexed rotated and side bent right L5 flexed rotated and side bent left Sacrum right on right    Impression and Recommendations:  Low back pain Low back does have significant loss of lordosis and seems to be more of the sacroiliac joint.  Will get x-rays to further evaluate.  Responded extremely well though to osteopathic manipulation.  We discussed icing regimen and home exercises, discussed which activities to do and which ones to avoid.  Increase activity slowly.  Discussed icing regimen.  Follow-up again in 6 to 8 weeks otherwise. Meloxicam  15 mg given.   Decision today to treat with OMT was based on Physical Exam  After verbal consent patient was treated with HVLA, ME, FPR techniques in cervical, thoracic, rib, lumbar and sacral areas, all areas are chronic   Patient tolerated the procedure well with improvement in symptoms  Patient given exercises, stretches and lifestyle modifications  See medications in patient instructions if given  Patient will follow up in 4-8 weeks  The above documentation has been reviewed and is accurate and complete Rashaun Wichert M Salaya Holtrop, DO

## 2024-02-11 ENCOUNTER — Ambulatory Visit

## 2024-02-11 ENCOUNTER — Other Ambulatory Visit

## 2024-02-11 ENCOUNTER — Ambulatory Visit: Admitting: Family Medicine

## 2024-02-11 ENCOUNTER — Encounter: Payer: Self-pay | Admitting: Family Medicine

## 2024-02-11 VITALS — BP 122/70 | HR 58 | Ht 62.0 in | Wt 147.0 lb

## 2024-02-11 DIAGNOSIS — M9902 Segmental and somatic dysfunction of thoracic region: Secondary | ICD-10-CM

## 2024-02-11 DIAGNOSIS — M545 Low back pain, unspecified: Secondary | ICD-10-CM

## 2024-02-11 DIAGNOSIS — M9903 Segmental and somatic dysfunction of lumbar region: Secondary | ICD-10-CM | POA: Diagnosis not present

## 2024-02-11 DIAGNOSIS — M9904 Segmental and somatic dysfunction of sacral region: Secondary | ICD-10-CM | POA: Diagnosis not present

## 2024-02-11 MED ORDER — MELOXICAM 15 MG PO TABS: 15.0000 mg | ORAL_TABLET | Freq: Every day | ORAL | 0 refills | Status: DC

## 2024-02-11 NOTE — Patient Instructions (Addendum)
 Good to see you. X-ray on the way out. Meloxicam  daily for 10 days then as needed.  SI exercises.  See me again in 2 months.

## 2024-02-11 NOTE — Assessment & Plan Note (Signed)
 Low back does have significant loss of lordosis and seems to be more of the sacroiliac joint.  Will get x-rays to further evaluate.  Responded extremely well though to osteopathic manipulation.  We discussed icing regimen and home exercises, discussed which activities to do and which ones to avoid.  Increase activity slowly.  Discussed icing regimen.  Follow-up again in 6 to 8 weeks otherwise.

## 2024-02-12 ENCOUNTER — Ambulatory Visit: Payer: Self-pay | Admitting: Internal Medicine

## 2024-02-12 LAB — VITAMIN D 25 HYDROXY (VIT D DEFICIENCY, FRACTURES): Vit D, 25-Hydroxy: 56 ng/mL (ref 30–100)

## 2024-02-13 ENCOUNTER — Other Ambulatory Visit: Payer: Self-pay | Admitting: Family Medicine

## 2024-02-17 ENCOUNTER — Ambulatory Visit: Payer: Self-pay | Admitting: Family Medicine

## 2024-02-24 ENCOUNTER — Ambulatory Visit: Admitting: Family Medicine

## 2024-03-09 ENCOUNTER — Other Ambulatory Visit: Payer: Self-pay | Admitting: Family Medicine

## 2024-04-09 ENCOUNTER — Encounter: Payer: Self-pay | Admitting: Family Medicine

## 2024-04-09 ENCOUNTER — Ambulatory Visit: Admitting: Family Medicine

## 2024-04-09 VITALS — BP 120/80 | HR 83 | Temp 98.5°F | Resp 16 | Ht 62.0 in | Wt 151.6 lb

## 2024-04-09 DIAGNOSIS — E785 Hyperlipidemia, unspecified: Secondary | ICD-10-CM

## 2024-04-09 DIAGNOSIS — B351 Tinea unguium: Secondary | ICD-10-CM

## 2024-04-09 DIAGNOSIS — I10 Essential (primary) hypertension: Secondary | ICD-10-CM | POA: Diagnosis not present

## 2024-04-09 DIAGNOSIS — Z23 Encounter for immunization: Secondary | ICD-10-CM

## 2024-04-09 LAB — COMPREHENSIVE METABOLIC PANEL WITH GFR
AG Ratio: 2 (calc) (ref 1.0–2.5)
ALT: 16 U/L (ref 6–29)
AST: 20 U/L (ref 10–35)
Albumin: 4.2 g/dL (ref 3.6–5.1)
Alkaline phosphatase (APISO): 75 U/L (ref 37–153)
BUN: 20 mg/dL (ref 7–25)
CO2: 30 mmol/L (ref 20–32)
Calcium: 9.5 mg/dL (ref 8.6–10.4)
Chloride: 102 mmol/L (ref 98–110)
Creat: 0.94 mg/dL (ref 0.50–1.05)
Globulin: 2.1 g/dL (ref 1.9–3.7)
Glucose, Bld: 109 mg/dL — ABNORMAL HIGH (ref 65–99)
Potassium: 3.7 mmol/L (ref 3.5–5.3)
Sodium: 140 mmol/L (ref 135–146)
Total Bilirubin: 0.3 mg/dL (ref 0.2–1.2)
Total Protein: 6.3 g/dL (ref 6.1–8.1)
eGFR: 69 mL/min/1.73m2

## 2024-04-09 MED ORDER — LISINOPRIL-HYDROCHLOROTHIAZIDE 10-12.5 MG PO TABS: 1.0000 | ORAL_TABLET | Freq: Every day | ORAL | 1 refills | Status: DC

## 2024-04-09 MED ORDER — TERBINAFINE HCL 250 MG PO TABS: 250.0000 mg | ORAL_TABLET | Freq: Every day | ORAL | 0 refills | Status: AC

## 2024-04-09 NOTE — Assessment & Plan Note (Signed)
 Restart lamisil  X 3 months Check cmp

## 2024-04-09 NOTE — Assessment & Plan Note (Signed)
 Encourage heart healthy diet such as MIND or DASH diet, increase exercise, avoid trans fats, simple carbohydrates and processed foods, consider a krill or fish or flaxseed oil cap daily.

## 2024-04-09 NOTE — Progress Notes (Signed)
 "  Subjective:    Patient ID: PINKY RAVAN, female    DOB: [birth year], 62 y.o.   MRN: [id number]  Chief Complaint  Patient presents with   Nail Problem    HPI Patient is in today for toenail fungus.  Discussed the use of AI scribe software for clinical note transcription with the patient, who gave verbal consent to proceed.  History of Present Illness KAMORIA LUCIEN is a 64 year old female who presents with recurrent toenail fungus.  She has recurrent toenail fungus on both feet. She previously underwent treatment with oral terbinafine  approximately 16 months ago. The treatment involved a three-month course of medication, during which she recalls having labs done. She recalls having completed at least one lab test halfway through the treatment.  She inquires about the impact of fingernail polish on her condition, expressing a preference for using it during the summer. She is also interested in home remedies, mentioning that she has heard about using apple cider vinegar soaks, although she is unsure of their effectiveness.  She inquired about the shingles vaccination.    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   Degenerative arthritis of knee, bilateral 05/06/2019   Elevated LDL cholesterol level 06/17/2017   Endometrial polyp    Essential hypertension 01/29/2019   Heart murmur Dr Delford   dx 2012--  Benign--  per echo abnormal mitral inflow   History of gastrointestinal ulcer    Hyperparathyroidism, primary 01/11/2018   IBS (irritable bowel syndrome)    Incomplete right bundle branch block 04/17/2020   Joint swelling 02/11/2019   Loss of transverse plantar arch 07/27/2014   Low back pain 09/09/2017   Lower extremity edema 02/11/2019   Nasal congestion 04/17/2020   Nonallopathic lesion of cervical region 06/10/2018    Nonallopathic lesion of lumbosacral region 06/10/2018   Nonallopathic lesion of rib cage 06/10/2018   Nonallopathic lesion of sacral region 06/10/2018   Nonallopathic lesion of thoracic region 06/10/2018   Palpitations 04/17/2020   Patellofemoral syndrome of both knees 12/19/2016   Inj 10/30 bilateral Bilateral injection September 09, 2017 repeat injections in March 17, 2018 August 06, 2018 Durolane approved 01/15/2019.  Injected January 28, 2019   Pharyngitis 06/21/2014   Plantar fasciitis, right 07/27/2014   Post-menopausal 06/17/2017   Seasonal allergies    Sinusitis, acute frontal 06/03/2014   Suprapubic pain 05/30/2017   Wears contact lenses     Past Surgical History:  Procedure Laterality Date   CARPAL TUNNEL RELEASE Left 05/15/2017   Procedure: LEFT CARPAL TUNNEL RELEASE;  Surgeon: Murrell Kuba, MD;  Location: [facility];  Service: Orthopedics;  Laterality: Left;   COLONOSCOPY     COLONOSCOPY WITH PROPOFOL   last one 04-28-2014   CYSTOSCOPY N/A 06/06/2014   Procedure: CYSTOSCOPY;  Surgeon: Norleen GORMAN Skill, MD;  Location: [facility];  Service: Gynecology;  Laterality: N/A;   DILATATION & CURRETTAGE/HYSTEROSCOPY WITH RESECTOCOPE N/A 06/06/2014   Procedure: DILATATION & CURETTAGE/HYSTEROSCOPY WITH RESECTOCOPE;  Surgeon: Norleen GORMAN Skill, MD;  Location: [facility];  Service: Gynecology;  Laterality: N/A;   LAPAROSCOPIC SALPINGO OOPHERECTOMY Left 06/06/2014   Procedure: LAPAROSCOPIC LEFT SALPINGO OOPHORECTOMY;  Surgeon: Norleen GORMAN Skill, MD;  Location: [facility];  Service: Gynecology;  Laterality: Left;   PARATHYROIDECTOMY Right 01/13/2018   Procedure: RIGHT  SUPERIOR PARATHYROIDECTOMY;  Surgeon: Eletha Boas, MD;  Location: [facility];  Service: General;  Laterality: Right;   PLANTAR FASCIA SURGERY Left 2009   topaz procedure   TRANSTHORACIC ECHOCARDIOGRAM  08-12-2007   normal LV,  ef 65-70%,  abnormal mitral inflow with trivial MR,  trivial PR and TR     Family History  Problem Relation Age of Onset   Allergic rhinitis Mother    Glaucoma Mother    Hypertension Mother    Hyperlipidemia Mother    Heart disease Mother    Arthritis-Osteo Father    Tuberculosis Maternal Grandfather    Diabetes Maternal Grandfather    Asthma Paternal Grandmother    Dementia Paternal Grandmother    Heart disease Paternal Grandfather    Tuberculosis Other    Colon cancer Neg Hx    Stomach cancer Neg Hx    Esophageal cancer Neg Hx    Pancreatic cancer Neg Hx     Social History   Socioeconomic History   Marital status: Married    Spouse name: Not on file   Number of children: 2   Years of education: Not on file   Highest education level: Not on file  Occupational History   Occupation: [employer]: [employer]  Tobacco Use   Smoking status: Never   Smokeless tobacco: Never  Vaping Use   Vaping status: Never Used  Substance and Sexual Activity   Alcohol use: Yes    Comment: occasional   Drug use: Never   Sexual activity: Yes    Birth control/protection: Post-menopausal  Other Topics Concern   Not on file  Social History Narrative   Married, administrator 2 daughters   1-2 caffeinated beverages/day   Social Drivers of Health   Tobacco Use: Low Risk (04/09/2024)   Patient History    Smoking Tobacco Use: Never    Smokeless Tobacco Use: Never    Passive Exposure: Not on file  Financial Resource Strain: Not on file  Food Insecurity: Not on file  Transportation Needs: Not on file  Physical Activity: Not on file  Stress: Not on file  Social Connections: Not on file  Intimate Partner Violence: Not on file  Depression (PHQ2-9): Low Risk (04/09/2024)   Depression (PHQ2-9)    PHQ-2 Score: 0  Alcohol Screen: Not on file  Housing: Not on file  Utilities: Not on file  Health Literacy: Not on file    Outpatient Medications Prior to Visit  Medication Sig Dispense Refill   cetirizine  (ZYRTEC ) 10 MG tablet Take 1  tablet (10 mg total) by mouth 2 (two) times daily as needed for allergies (Can use an extra dose during flare ups). 180 tablet 1   clotrimazole -betamethasone  (LOTRISONE ) cream Apply 1 application topically 2 (two) times daily. For 2 weeks 60 g 0   famotidine  (PEPCID ) 40 MG tablet Take 1 tablet (40 mg total) by mouth daily. 90 tablet 3   gabapentin  (NEURONTIN ) 100 MG capsule TAKE 2 CAPSULES BY MOUTH AT BEDTIME. 180 capsule 2   ibandronate (BONIVA) 150 MG tablet TAKE 1 TABLET BY MOUTH EVERY MONTH     Melatonin 5 MG CAPS Take 10 mg by mouth as needed.     meloxicam  (MOBIC ) 15 MG tablet TAKE 1 TABLET (15 MG TOTAL) BY MOUTH DAILY. 30 tablet 0   montelukast  (SINGULAIR ) 10 MG tablet Take 1 tablet (10 mg total) by mouth at bedtime. 90 tablet 3   ondansetron  (ZOFRAN ) 4 MG tablet Take 1 tablet (4  mg total) by mouth every 8 (eight) hours as needed for nausea or vomiting. 20 tablet 0   potassium chloride  (KLOR-CON ) 10 MEQ tablet Take 2 tablets (20 mEq total) by mouth daily. 180 tablet 3   traZODone  (DESYREL ) 100 MG tablet TAKE 1 TABLET (100 MG TOTAL) BY MOUTH AT BEDTIME AS NEEDED. FOR SLEEP 90 tablet 0   lisinopril -hydrochlorothiazide  (ZESTORETIC ) 10-12.5 MG tablet Take 1 tablet by mouth daily. Pt needs office visit for further refills 90 tablet 1   No facility-administered medications prior to visit.    No Known Allergies  Review of Systems  Constitutional:  Negative for fever and malaise/fatigue.  HENT:  Negative for congestion.   Eyes:  Negative for blurred vision.  Respiratory:  Negative for cough and shortness of breath.   Cardiovascular:  Negative for chest pain, palpitations and leg swelling.  Gastrointestinal:  Negative for abdominal pain, blood in stool, nausea and vomiting.  Genitourinary:  Negative for dysuria and frequency.  Musculoskeletal:  Negative for back pain and falls.  Skin:  Negative for rash.  Neurological:  Negative for dizziness, loss of consciousness and headaches.   Endo/Heme/Allergies:  Negative for environmental allergies.  Psychiatric/Behavioral:  Negative for depression. The patient is not nervous/anxious.        Objective:    Physical Exam Vitals and nursing note reviewed.  Constitutional:      General: She is not in acute distress.    Appearance: Normal appearance. She is well-developed.  HENT:     Head: Normocephalic and atraumatic.  Eyes:     General: No scleral icterus.       Right eye: No discharge.        Left eye: No discharge.  Cardiovascular:     Rate and Rhythm: Normal rate and regular rhythm.     Heart sounds: No murmur heard. Pulmonary:     Effort: Pulmonary effort is normal. No respiratory distress.     Breath sounds: Normal breath sounds.  Musculoskeletal:        General: Normal range of motion.     Cervical back: Normal range of motion and neck supple.     Right lower leg: No edema.     Left lower leg: No edema.     Comments: Both feet -- big toe nails thick/ yellow   Skin:    General: Skin is warm and dry.  Neurological:     Mental Status: She is alert and oriented to person, place, and time.  Psychiatric:        Mood and Affect: Mood normal.        Behavior: Behavior normal.        Thought Content: Thought content normal.        Judgment: Judgment normal.     BP 120/80 (BP Location: Right Arm, Patient Position: Sitting, Cuff Size: Normal)   Pulse 83   Temp 98.5 F (36.9 C) (Oral)   Resp 16   Ht 5' 2 (1.575 m)   Wt 151 lb 9.6 oz (68.8 kg)   LMP 03/30/2016   SpO2 98%   BMI 27.73 kg/m  Wt Readings from Last 3 Encounters:  04/09/24 151 lb 9.6 oz (68.8 kg)  02/11/24 147 lb (66.7 kg)  11/10/23 148 lb 3.2 oz (67.2 kg)    Diabetic Foot Exam - Simple   No data filed    Lab Results  Component Value Date   WBC 4.4 11/11/2023   HGB 12.7 11/11/2023   HCT 37.2 11/11/2023  PLT 277.0 11/11/2023   GLUCOSE 90 11/11/2023   CHOL 203 (H) 11/11/2023   TRIG 97.0 11/11/2023   HDL 71.30 11/11/2023    LDLCALC 112 (H) 11/11/2023   ALT 15 11/11/2023   AST 17 11/11/2023   NA 141 11/11/2023   K 3.7 11/11/2023   CL 105 11/11/2023   CREATININE 0.84 11/11/2023   BUN 13 11/11/2023   CO2 31 11/11/2023   TSH 0.82 11/11/2023    Lab Results  Component Value Date   TSH 0.82 11/11/2023   Lab Results  Component Value Date   WBC 4.4 11/11/2023   HGB 12.7 11/11/2023   HCT 37.2 11/11/2023   MCV 92.2 11/11/2023   PLT 277.0 11/11/2023   Lab Results  Component Value Date   NA 141 11/11/2023   K 3.7 11/11/2023   CO2 31 11/11/2023   GLUCOSE 90 11/11/2023   BUN 13 11/11/2023   CREATININE 0.84 11/11/2023   BILITOT 0.4 11/11/2023   ALKPHOS 68 11/11/2023   AST 17 11/11/2023   ALT 15 11/11/2023   PROT 6.4 11/11/2023   ALBUMIN 4.2 11/11/2023   CALCIUM 8.9 11/11/2023   ANIONGAP 8 03/25/2023   GFR 75.32 11/11/2023   Lab Results  Component Value Date   CHOL 203 (H) 11/11/2023   Lab Results  Component Value Date   HDL 71.30 11/11/2023   Lab Results  Component Value Date   LDLCALC 112 (H) 11/11/2023   Lab Results  Component Value Date   TRIG 97.0 11/11/2023   Lab Results  Component Value Date   CHOLHDL 3 11/11/2023   No results found for: HGBA1C     Assessment & Plan:  Onychomycosis Assessment & Plan: Restart lamisil  X 3 months Check cmp  Orders: -     Terbinafine  HCl; Take 1 tablet (250 mg total) by mouth daily.  Dispense: 90 tablet; Refill: 0 -     Comprehensive metabolic panel with GFR -     Comprehensive metabolic panel with GFR; Future -     Comprehensive metabolic panel with GFR; Future  Primary hypertension -     Lisinopril -hydroCHLOROthiazide ; Take 1 tablet by mouth daily. Pt needs office visit for further refills  Dispense: 90 tablet; Refill: 1  Need for shingles vaccine -     Varicella-zoster vaccine IM  Hyperlipidemia, unspecified hyperlipidemia type Assessment & Plan: Encourage heart healthy diet such as MIND or DASH diet, increase exercise, avoid  trans fats, simple carbohydrates and processed foods, consider a krill or fish or flaxseed oil cap daily.       Alaiah Lundy R Lowne Chase, DO  "

## 2024-04-12 ENCOUNTER — Ambulatory Visit: Payer: Self-pay | Admitting: Family Medicine

## 2024-04-15 ENCOUNTER — Ambulatory Visit: Admitting: Family Medicine

## 2024-05-07 ENCOUNTER — Other Ambulatory Visit: Payer: Self-pay | Admitting: Family Medicine

## 2024-05-07 DIAGNOSIS — I10 Essential (primary) hypertension: Secondary | ICD-10-CM

## 2024-05-14 ENCOUNTER — Other Ambulatory Visit: Payer: Self-pay | Admitting: Family Medicine

## 2024-07-12 ENCOUNTER — Ambulatory Visit: Admitting: Internal Medicine

## 2024-07-19 ENCOUNTER — Ambulatory Visit: Admitting: Internal Medicine
